# Patient Record
Sex: Female | Born: 1950 | Race: Black or African American | Hispanic: No | Marital: Married | Smoking: Never smoker
Health system: Southern US, Community
[De-identification: ages and names within clinical notes are randomized; demographics above are authoritative.]

## PROBLEM LIST (undated history)

## (undated) ENCOUNTER — Emergency Department (HOSPITAL_BASED_OUTPATIENT_CLINIC_OR_DEPARTMENT_OTHER): Discharge status: Absent without leave | Payer: 59

## (undated) DIAGNOSIS — I10 Essential (primary) hypertension: Secondary | ICD-10-CM

## (undated) DIAGNOSIS — C50919 Malignant neoplasm of unspecified site of unspecified female breast: Secondary | ICD-10-CM

## (undated) DIAGNOSIS — E119 Type 2 diabetes mellitus without complications: Secondary | ICD-10-CM

## (undated) DIAGNOSIS — M1990 Unspecified osteoarthritis, unspecified site: Secondary | ICD-10-CM

## (undated) DIAGNOSIS — I2510 Atherosclerotic heart disease of native coronary artery without angina pectoris: Secondary | ICD-10-CM

## (undated) DIAGNOSIS — I219 Acute myocardial infarction, unspecified: Secondary | ICD-10-CM

## (undated) DIAGNOSIS — S92919A Unspecified fracture of unspecified toe(s), initial encounter for closed fracture: Secondary | ICD-10-CM

## (undated) DIAGNOSIS — H269 Unspecified cataract: Secondary | ICD-10-CM

## (undated) DIAGNOSIS — M797 Fibromyalgia: Secondary | ICD-10-CM

## (undated) HISTORY — DX: Unspecified fracture of unspecified toe(s), initial encounter for closed fracture: S92.919A

## (undated) HISTORY — PX: ABDOMINAL HYSTERECTOMY: SHX81

## (undated) HISTORY — DX: Acute myocardial infarction, unspecified: I21.9

## (undated) HISTORY — DX: Unspecified cataract: H26.9

## (undated) HISTORY — PX: SMALL INTESTINE SURGERY: SHX150

## (undated) HISTORY — PX: BREAST SURGERY: SHX581

## (undated) HISTORY — PX: TONSILLECTOMY: SUR1361

## (undated) HISTORY — DX: Atherosclerotic heart disease of native coronary artery without angina pectoris: I25.10

---

## 1997-08-29 ENCOUNTER — Ambulatory Visit (HOSPITAL_COMMUNITY): Admission: RE | Admit: 1997-08-29 | Discharge: 1997-08-29 | Payer: Self-pay | Admitting: Internal Medicine

## 1998-04-27 ENCOUNTER — Ambulatory Visit (HOSPITAL_COMMUNITY): Admission: RE | Admit: 1998-04-27 | Discharge: 1998-04-27 | Payer: Self-pay | Admitting: Internal Medicine

## 1998-04-27 ENCOUNTER — Other Ambulatory Visit: Admission: RE | Admit: 1998-04-27 | Discharge: 1998-04-27 | Payer: Self-pay

## 2000-02-28 ENCOUNTER — Encounter: Admission: RE | Admit: 2000-02-28 | Discharge: 2000-02-28 | Payer: Self-pay | Admitting: Internal Medicine

## 2000-02-28 ENCOUNTER — Other Ambulatory Visit: Admission: RE | Admit: 2000-02-28 | Discharge: 2000-02-28 | Payer: Self-pay | Admitting: Internal Medicine

## 2000-02-28 ENCOUNTER — Encounter: Payer: Self-pay | Admitting: Internal Medicine

## 2000-03-12 ENCOUNTER — Encounter: Admission: RE | Admit: 2000-03-12 | Discharge: 2000-03-12 | Payer: Self-pay | Admitting: Urology

## 2000-03-12 ENCOUNTER — Encounter: Payer: Self-pay | Admitting: Urology

## 2000-03-17 ENCOUNTER — Encounter: Payer: Self-pay | Admitting: Urology

## 2000-03-17 ENCOUNTER — Encounter: Admission: RE | Admit: 2000-03-17 | Discharge: 2000-03-17 | Payer: Self-pay | Admitting: Urology

## 2000-05-14 ENCOUNTER — Encounter: Payer: Self-pay | Admitting: Internal Medicine

## 2000-05-14 ENCOUNTER — Encounter: Admission: RE | Admit: 2000-05-14 | Discharge: 2000-05-14 | Payer: Self-pay | Admitting: Internal Medicine

## 2000-05-29 ENCOUNTER — Ambulatory Visit (HOSPITAL_COMMUNITY): Admission: RE | Admit: 2000-05-29 | Discharge: 2000-05-29 | Payer: Self-pay | Admitting: Gastroenterology

## 2000-11-23 ENCOUNTER — Inpatient Hospital Stay (HOSPITAL_COMMUNITY): Admission: EM | Admit: 2000-11-23 | Discharge: 2000-11-27 | Payer: Self-pay

## 2000-11-23 ENCOUNTER — Encounter: Payer: Self-pay | Admitting: Internal Medicine

## 2000-11-24 ENCOUNTER — Encounter: Payer: Self-pay | Admitting: Internal Medicine

## 2000-11-26 ENCOUNTER — Encounter: Payer: Self-pay | Admitting: Internal Medicine

## 2000-11-26 ENCOUNTER — Encounter: Payer: Self-pay | Admitting: Gastroenterology

## 2009-03-22 ENCOUNTER — Emergency Department (HOSPITAL_BASED_OUTPATIENT_CLINIC_OR_DEPARTMENT_OTHER): Admission: EM | Admit: 2009-03-22 | Discharge: 2009-03-22 | Payer: Self-pay | Admitting: Emergency Medicine

## 2010-11-15 NOTE — H&P (Signed)
[place]. [facility]  Patient:    Whitney Ward, Whitney Ward                   MRN: [id number] Adm. Date:  [id number] Attending:  Gwenyth Bender                         History and Physical  CHIEF COMPLAINT:  Severe abdominal pain with nausea and vomiting.  HISTORY OF PRESENT ILLNESS:  This is the first recent [facility] admission for this 60 year old married black female who presented to the emergency room complaining of severe upper abdominal pain of 24-hour duration. She states she had been feeling fairly well except for intermittent vague epigastric discomfort over the past two weeks.  She notably had been taking ibuprofen 200 mg, up to three at night, "to help her sleep."  Last night, she noted increasing epigastric sharp pains.  This was followed by episodic bilious vomiting thereafter.  She had not seen any hematemesis.  No associated melena or hematochezia over the past several days.  The pain became quite severe today to the point of her not being able to stand.  She was unable to eat and subsequently came to the emergency room for further evaluation.  She notably, while in the emergency room, had transient left lower quadrant pain as well.  Patient denied constipation.  She denied change in bowel habits with associated diarrhea, though husband relates she has had episodic bouts of this over the past week.  Notably, she states she felt well until she ate a sausage at a cookout.  Several other family members ate similar food without associated similar symptoms.  The patient has not had similar episodes in the past.  She has had a colonoscopy in November per Dr. Katy Fitch. Buccini without abnormalities being found.  She notably had had some intermittent reflux symptoms which are different from her presenting symptoms.  It was advised that she undergo endoscopy in November to rule out a Barretts esophagus, however, patient declined at that  time.  HABITS:  Patient does not smoke or drink.  PAST MEDICAL HISTORY:  Remarkable for allergic rhinitis, mild hypertension; she has also suffered from intermittent hot flashes as well.  ALLERGIES:  PENICILLIN, CODEINE and SULFA.  MEDICATIONS: 1. Prilosec 20 mg p.o. q.h.s. 2. Toprol-XL 25 mg q.d. 3. Allegra 180 mg q.d. 4. Triamterene/hydrochlorothiazide 25 mg p.o. q.d. 5. Premarin 0.625 mg p.o. q.d.  SOCIAL HISTORY:  Patient is married, works as a Engineer, civil (consulting) at [employer].  REVIEW OF SYSTEMS:  Otherwise as noted.  She also notably had transient tingling sensations in both hands as well as the left side of her face during this episode.  It was noted by EMS that she was hyperventilating with respiratory rate up to 40; this has gradually subsided as well.  PHYSICAL EXAMINATION:  GENERAL:  She is a well-developed, well-nourished black female in acute distress.  VITAL SIGNS:  Blood pressure 136/90, pulse of 94, respiratory rate 24, temperature 99.9.  O2 saturation 99% on room air.  HEENT:  Head normocephalic, atraumatic, without bruits.  Extraocular muscles are intact with mild proptosis of the right eye.  Pupils equal and reactive to light and accommodation.  There is no sinus tenderness and no scleral icterus. TMs with diffuse light reflex without erythematous changes.  Posterior pharynx is clear.  NECK:  Supple.  No posterior cervical nodes.  LUNGS:  Clear without wheezes  or rales.  No E-to-A changes.  No CVA tenderness.  CARDIOVASCULAR:  Normal S1 and S2.  No S3, S4, murmurs or rubs.  ABDOMEN:  Bowel sounds notably present.  Marked epigastric to midgastric tenderness.  There is mild tenderness in the left lower quadrant as well.  No masses appreciated.  MUSCULOSKELETAL:  Full passive range of motion in upper extremities.  No edema or tenderness in the lower extremities.  Negative Homans.  NEUROLOGIC:  Alert and oriented x 3.  Cranial nerves were intact.   Cerebellar, sensory and motor function were intact.  LABORATORY DATA:  CBC reveals a WBC 6200, hemoglobin of 12.1, hematocrit 34.8, MCV of 88, RDW 12.5, platelets 249,000.  She had 90 neutrophils, 5% lymphs, 5% monos.  Chemistries:  Sodium 141, potassium 3.2, chloride 110, CO2 24, BUN of 12, creatinine 0.9, glucose 130, mildly elevated, calcium 8.8, total protein 6.8, albumin 3.5, SGOT 21, SGPT 18, alkaline phosphatase 99, bilirubin was 0.7.  Lipase was 19, which was low; amylase 79, within normal limits.  Abdominal film showed no evidence of obstruction.  Mild increased stool in the left lower quadrant.  IMPRESSION: 1. Abdominal pain of questionable etiology.  Rule out severe gastritis versus    esophagitis with mild atypical presentation.  She notably had been using    more nonsteroidal anti-inflammatory drugs at h.s. 2. Rule out colonic spasms.  Patient with severe left lower quadrant pain,    normal colonoscopy as of November. 3. Hypokalemia.  This may be secondary to her diuretic as well as recent bouts    of diarrhea, nausea and vomiting.  This may also have aggravated her    transient dysesthesias. 4. Transient dysesthesias of arms and face, rule out secondary to    hypoventilation versus hypokalemia versus other.  They have presently    resolved. 5. History of allergic rhinitis. 6. Vasomotor symptoms persistent. 7. Hypertension. 8. Mild hyperglycemia.  This would be of relatively new onset.  PLAN: 1. Patient will be placed at rest. 2. IV fluids with potassium replacement. 3. Control of pain acutely at this time with a close clinical followup. 4. GI evaluation of her upper abdominal region.  This has been previously    recommended in the past; patient had declined.  Will continue intravenous    Pepcid with a Carafate slurry for now.  Guaiac of stools x 3.  Follow up    electrolytes after this is corrected.  Further evaluation thereafter.  DD: 11/23/00 TD:  11/24/00 Job:  [id number]

## 2010-11-15 NOTE — Discharge Summary (Signed)
[place]. [facility]  Patient:    Whitney Ward, Whitney Ward                   MRN: [id number] Adm. Date:  [id number] Disc. Date: [id number] Attending:  Gwenyth Bender                           Discharge Summary  FINAL DIAGNOSES:  1. Esophageal reflux, 530.81.  2. Diaphragmatic hernia, 553.3.  3. Hypokalemia, 276.8.  4. Hypertension, 401.9.  5. Abnormal blood chemistries, 790.6.  6. Chronic sinusitis, 473.9.  7. Allergic rhinitis, 477.9.  8. 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  9. 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 10. 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  OPERATIONS/PROCEDURES:  Small-bowel endoscopy per Dr. Carman Ching.  HISTORY OF PRESENT ILLNESS:  This was the first recent [facility] admission for this 60 year old married black female who presented to the emergency room complaining of severe upper abdominal pain of 24-hour duration. She states the pain had been intermittent in nature over the past two weeks. She notably had been taking ibuprofen 200 mg up to three at night.  States this was taken to help her sleep.  One night prior to admission she noted increasing epigastric pain which was sharp in nature.  This was followed by episodic bilious vomiting thereafter.  There had been no associated hematemesis.  No melena or hematochezia.  The pain became severe on the day of admission.  She was unable to stand.  She was unable to eat and subsequently was brought to the emergency room for further evaluation.  PAST MEDICAL HISTORY:  As per admission H&P.  PHYSICAL EXAMINATION:  As per admission H&P.  HOSPITAL COURSE:  The patient was admitted for further evaluation of severe abdominal pain.  Notably, she had no associated fever, maximum being 99.9. White count was normal as well.  Her initial chemistries were normal.  The patient was placed at bowel rest.  She was placed on IV fluids.  Notably, her potassium was low on presentation at 3.2.  This was  replaced via IV route and then p.o. route.  The patient was placed on IV Pepcid in lieu of the epigastric nature of her pain.  She was also started on Carafate slurry. Stools were obtained x 3.  No evidence for occult blood was noted.  The patient was seen in consultation by Dr. Carman Ching.  It was unclear as to the etiology of her pain.  She subsequently underwent further evaluation including abdominal ultrasound, which was negative.  A CT scan of the abdomen was negative as well.  She subsequently underwent endoscopy per Dr. Randa Evens. She was found to have a small hiatal hernia with mild gastritis as well. Notably, a CT scan of the head was also obtained due to headaches and sinus tenderness.  She was found to have right greater than left sphenoid sinusitis.  The patient was continued on Nexium at 40 mg p.o. b.i.d.  She was started on Vantin for her sinuses.  She was maintained on Allegra for her allergies. Over the subsequent day, however, her symptoms did improve.  Stools for occult blood and parasites were obtained, which were negative as well.  By Nov 27, 2000, she was feeling much better.  Abdominal pain had resolved considerably. She was felt to be stable for discharge.  DISCHARGE MEDICATIONS: 1. Nexium 40 mg p.o. q.a.m. 2. Nasonex 1 capsule b.i.d. 3. Claritin 10 mg p.o. q.a.m.  4. K-Dur 20 mEq b.i.d. 5. Premarin 0.625 mg q.d. 6. Vitamin E 400 IU b.i.d. 7. Flagyl 250 mg t.i.d.  DISCHARGE INSTRUCTIONS:  She will be maintained on a soft, calcium-free diet. Activity as tolerated.  FOLLOW-UP:  Follow-up appointment in one weeks time. DD:  01/13/01 TD:  01/15/01 Job: [id number]

## 2010-11-15 NOTE — Procedures (Signed)
[place]. [facility]  Patient:    Whitney Ward, Whitney Ward                   MRN: [id number] Proc. Date: 11/25/00 Adm. Date:  [id number] Attending:  Gwenyth Bender CC:         Lind Guest. August Saucer, M.D.   Procedure Report  PROCEDURE PERFORMED:  Esophagogastroduodenscopy.  ENDOSCOPIST:  Llana Aliment. Edwards, M.D.  MEDICATIONS:  Hurricaine spray, fentanyl 50 mcg, Versed 5 mg IV.  INDICATIONS:  Epigastric pain of unclear.  DESCRIPTION OF PROCEDURE:  The procedure had been explained to the patient and consent obtained.  With the patient in the left lateral decubitus position, the Olympus video endoscope was inserted blindly in the esophagus and advanced under direct visualization.  The stomach was entered.  The pylorus identified and passed.  The duodenum including the bulb and second portion were seen well.  The scope was withdrawn back into the stomach.  The duodenal bulb was normal and no ulceration.  Pyloric channel was normal.  The antrum and body were seen well and were normal.  The fundus and cardia were seen on retroflex view and were normal.  There was a hiatal hernia.  The distal esophagus was reddened and no ulcerations.  This was consistent with mild esophageal reflux. The proximal esophagus was normal.  The scope was withdrawn.  The patient tolerated the procedure quite well.  ASSESSMENT:  Hiatal hernia probably with some element of esophageal reflux. This is not severe.  I do not see any ulcer or esophagitis.  PLAN:  I would recommend a CT, hepatobiliary scan etc.  Will discuss with Dr. August Saucer.  PLAN: DD:  11/25/00 TD:  11/25/00 Job: [id number]

## 2010-11-15 NOTE — Consult Note (Signed)
[place]. [facility]  Patient:    Whitney Ward, Whitney Ward                   MRN: [id number] Proc. Date: 11/23/00 Adm. Date:  [id number] Attending:  Gwenyth Bender CC:         Lind Guest. August Saucer, M.D.  Florencia Reasons, M.D.   Consultation Report  REASON FOR CONSULTATION:  Nausea, vomiting, abdominal pain.  HISTORY OF PRESENT ILLNESS:  A 60 year old woman who has had previous esophageal reflux.  She has been on Prilosec daily for this.  She clearly recognizes reflux and notes that she has had this for some time.  She had been well, in her usual state of health, until Monday morning, when she awakened with severe epigastric abdominal pain.  This was yesterday morning when it awakened her.  This had been intermittent for two weeks and she had been taking ibuprofen, two or three at night, to help her sleep.  She had bilious vomiting following awakening up with this yesterday and in addition had some loose stools.  She is not chronically constipated.  Again, she had been taking the ibuprofen.  CURRENT MEDICATIONS: 1. Prilosec. 2. Toprol. 3. ______. 4. Premarin.  ALLERGIES: 1. PENICILLIN. 2. CODEINE. 3. SULFA.  PAST MEDICAL HISTORY:  Chronic esophageal reflux, hypertension.  She also has seasonal inhalation allergies.  PAST SURGICAL HISTORY:  Hysterectomy and C-section.  FAMILY HISTORY:  A maternal aunt and uncles have had colon cancer.  There is no family history of liver disease.  SOCIAL HISTORY:  She is a Therapist, music at [employer].  She is married and lives in [place].  She drinks approximately once a week.  REVIEW OF SYSTEMS:  The patient had a screening colonoscopy due to her family history of colon cancer back in November, which was reported to be normal, by Dr. Matthias Hughs.  PHYSICAL EXAMINATION:  VITAL SIGNS:  The patient is afebrile.  Vital signs are normal.  GENERAL:  A pleasant black female in no acute distress.  HEENT:  Eyes:  Sclerae  non-icteric.  Extraocular movements intact.  NECK:  Supple.  No lymphadenopathy.  LUNGS:  Clear.  HEART:  Regular rate and rhythm without murmurs or gallops.  ABDOMEN:  Good bowel sounds.  Mild epigastric tenderness to palpation. Otherwise, unremarkable.  LABORATORY DATA:  White count 6.2, hemoglobin 12.1.  Electrolytes are not remarkable.  There was a slightly low potassium of 3.2, which is being corrected.  Liver tests, amylase, and lipase were all normal.  Gallbladder ultrasound has been obtained and is normal.  No evidence of gallstones.  ASSESSMENT:  Nausea, vomiting, and epigastric pain.  This could be due to a multitude of things including a viral gastroenteritis type picture.  The patient has been taking a large amount of ibuprofen recently.  I think, in view of this, we probably ought to go ahead with an endoscopy while she is here.  I have discussed this with her and she is agreeable.  PLAN:  Endoscopy is scheduled for tomorrow at 1:30.  We have discussed this with the patient including potential risks and benefits and we will plan on moving ahead. DD:  11/24/00 TD:  11/24/00 Job: [id number]

## 2011-12-21 ENCOUNTER — Emergency Department (HOSPITAL_BASED_OUTPATIENT_CLINIC_OR_DEPARTMENT_OTHER): Payer: BC Managed Care – PPO

## 2011-12-21 ENCOUNTER — Encounter (HOSPITAL_BASED_OUTPATIENT_CLINIC_OR_DEPARTMENT_OTHER): Payer: Self-pay | Admitting: Emergency Medicine

## 2011-12-21 ENCOUNTER — Emergency Department (HOSPITAL_BASED_OUTPATIENT_CLINIC_OR_DEPARTMENT_OTHER)
Admission: EM | Admit: 2011-12-21 | Discharge: 2011-12-21 | Disposition: A | Discharge status: Home / Self Care | Payer: BC Managed Care – PPO | Attending: Emergency Medicine | Admitting: Emergency Medicine

## 2011-12-21 DIAGNOSIS — Z7982 Long term (current) use of aspirin: Secondary | ICD-10-CM | POA: Insufficient documentation

## 2011-12-21 DIAGNOSIS — Z853 Personal history of malignant neoplasm of breast: Secondary | ICD-10-CM | POA: Insufficient documentation

## 2011-12-21 DIAGNOSIS — Z8739 Personal history of other diseases of the musculoskeletal system and connective tissue: Secondary | ICD-10-CM | POA: Insufficient documentation

## 2011-12-21 DIAGNOSIS — Z79899 Other long term (current) drug therapy: Secondary | ICD-10-CM | POA: Insufficient documentation

## 2011-12-21 DIAGNOSIS — IMO0001 Reserved for inherently not codable concepts without codable children: Secondary | ICD-10-CM | POA: Insufficient documentation

## 2011-12-21 DIAGNOSIS — N23 Unspecified renal colic: Secondary | ICD-10-CM | POA: Insufficient documentation

## 2011-12-21 DIAGNOSIS — R109 Unspecified abdominal pain: Secondary | ICD-10-CM | POA: Insufficient documentation

## 2011-12-21 DIAGNOSIS — I10 Essential (primary) hypertension: Secondary | ICD-10-CM | POA: Insufficient documentation

## 2011-12-21 HISTORY — DX: Essential (primary) hypertension: I10

## 2011-12-21 HISTORY — DX: Unspecified osteoarthritis, unspecified site: M19.90

## 2011-12-21 HISTORY — DX: Malignant neoplasm of unspecified site of unspecified female breast: C50.919

## 2011-12-21 HISTORY — DX: Fibromyalgia: M79.7

## 2011-12-21 LAB — COMPREHENSIVE METABOLIC PANEL
ALT: 18 U/L (ref 0–35)
AST: 22 U/L (ref 0–37)
Albumin: 4.2 g/dL (ref 3.5–5.2)
Calcium: 9.7 mg/dL (ref 8.4–10.5)
Creatinine, Ser: 0.9 mg/dL (ref 0.50–1.10)
Sodium: 141 mEq/L (ref 135–145)

## 2011-12-21 LAB — DIFFERENTIAL
Basophils Absolute: 0 10*3/uL (ref 0.0–0.1)
Basophils Relative: 0 % (ref 0–1)
Eosinophils Relative: 5 % (ref 0–5)
Monocytes Absolute: 0.4 10*3/uL (ref 0.1–1.0)
Neutro Abs: 1.8 10*3/uL (ref 1.7–7.7)

## 2011-12-21 LAB — CBC
HCT: 35.7 % — ABNORMAL LOW (ref 36.0–46.0)
MCHC: 34.2 g/dL (ref 30.0–36.0)
Platelets: 226 10*3/uL (ref 150–400)
RDW: 11.5 % (ref 11.5–15.5)
WBC: 3.3 10*3/uL — ABNORMAL LOW (ref 4.0–10.5)

## 2011-12-21 LAB — URINALYSIS, ROUTINE W REFLEX MICROSCOPIC
Glucose, UA: NEGATIVE mg/dL
Ketones, ur: NEGATIVE mg/dL
Leukocytes, UA: NEGATIVE
Protein, ur: NEGATIVE mg/dL
Urobilinogen, UA: 0.2 mg/dL (ref 0.0–1.0)

## 2011-12-21 LAB — WET PREP, GENITAL: Trich, Wet Prep: NONE SEEN

## 2011-12-21 MED ORDER — SODIUM CHLORIDE 0.9 % IV BOLUS (SEPSIS)
1000.0000 mL | Freq: Once | INTRAVENOUS | Status: AC
  Administered 2011-12-21: 1000 mL via INTRAVENOUS

## 2011-12-21 NOTE — ED Notes (Signed)
C/o of lower back pain x 4 days/ with abdominal pain  denies urinary freq/c/o of urinary burning sometimes

## 2011-12-21 NOTE — ED Notes (Signed)
Patient transported to CT 

## 2011-12-21 NOTE — ED Provider Notes (Signed)
History     CSN: [id number]  Arrival date & time 12/21/11  1008   First MD Initiated Contact with Patient 12/21/11 1031      Chief Complaint  Patient presents with  . Back Pain  . Abdominal Pain    (Consider location/radiation/quality/duration/timing/severity/associated sxs/prior treatment) HPI Pt reprots she has had several days of moderate aching low back pain and lower abdominal pain, worse with movement and bending. No fever, vomiting, diarrhea, vaginal discharge or bleeding. She reports some occasional mild burning with urination, but no frequency or hesitancy. Recently completed a course of chemo and radiation for breast cancer following lumpectomy.   Past Medical History  Diagnosis Date  . Breast cancer   . Hypertension   . Fibromyalgia   . Arthritis     Past Surgical History  Procedure Date  . Breast surgery   . Abdominal hysterectomy     No family history on file.  History  Substance Use Topics  . Smoking status: Never Smoker   . Smokeless tobacco: Not on file  . Alcohol Use: Yes    OB History    Grav Para Term Preterm Abortions TAB SAB Ect Mult Living                  Review of Systems Unable to assess due to mental status.   Allergies  Codeine; Penicillins; Sulfa antibiotics; and Tramadol  Home Medications   Current Outpatient Rx  Name Route Sig Dispense Refill  . ANASTROZOLE 1 MG PO TABS Oral Take 1 mg by mouth daily.    . ASPIRIN 81 MG PO TABS Oral Take 81 mg by mouth daily.    . CELECOXIB 200 MG PO CAPS Oral Take 200 mg by mouth 2 (two) times daily.    Marland Kitchen CLONAZEPAM 0.5 MG PO TABS Oral Take 0.5 mg by mouth 2 (two) times daily as needed.    Marland Kitchen DILTIAZEM HCL 120 MG PO TABS Oral Take 120 mg by mouth 4 (four) times daily.    Marland Kitchen HYDROCHLOROTHIAZIDE 25 MG PO TABS Oral Take 25 mg by mouth daily.    Marland Kitchen HYDROCODONE-ACETAMINOPHEN 5-500 MG PO TABS Oral Take 1 tablet by mouth every 6 (six) hours as needed.    Marland Kitchen LABETALOL HCL 100 MG PO TABS Oral Take 100  mg by mouth 2 (two) times daily.    Marland Kitchen LORAZEPAM 0.5 MG PO TABS Oral Take 0.5 mg by mouth every 8 (eight) hours.    Marland Kitchen ZOLPIDEM TARTRATE 10 MG PO TABS Oral Take 10 mg by mouth at bedtime as needed.      BP 154/89  Pulse 81  Temp 98.2 F (36.8 C)  Resp 20  SpO2 100%  Physical Exam  Nursing note and vitals reviewed. Constitutional: She is oriented to person, place, and time. She appears well-developed and well-nourished.  HENT:  Head: Normocephalic and atraumatic.  Eyes: EOM are normal. Pupils are equal, round, and reactive to light.  Neck: Normal range of motion. Neck supple.  Cardiovascular: Normal rate, normal heart sounds and intact distal pulses.   Pulmonary/Chest: Effort normal and breath sounds normal.  Abdominal: Bowel sounds are normal. She exhibits no distension. There is tenderness (mild tenderness in lower abdomen bilaterally). There is no rebound and no guarding.  Genitourinary: Right adnexum displays tenderness. Right adnexum displays no mass. Left adnexum displays no mass and no tenderness. No bleeding around the vagina. No foreign body around the vagina. No vaginal discharge found.  R CVA tenderness; cervix is surgically absent  Musculoskeletal: Normal range of motion. She exhibits no edema and no tenderness.  Neurological: She is alert and oriented to person, place, and time. She has normal strength. No cranial nerve deficit or sensory deficit.  Skin: Skin is warm and dry. No rash noted.  Psychiatric: She has a normal mood and affect.    ED Course  Procedures (including critical care time)  Labs Reviewed  CBC - Abnormal; Notable for the following:    WBC 3.3 (*)     HCT 35.7 (*)     All other components within normal limits  COMPREHENSIVE METABOLIC PANEL - Abnormal; Notable for the following:    GFR calc non Af Amer 68 (*)     GFR calc Af Amer 78 (*)     All other components within normal limits  URINALYSIS, ROUTINE W REFLEX MICROSCOPIC  DIFFERENTIAL  WET  PREP, GENITAL  GC/CHLAMYDIA PROBE AMP, GENITAL   Ct Abdomen Pelvis Wo Contrast  12/21/2011  *RADIOLOGY REPORT*  Clinical Data: 61 year old female with abdominal and pelvic pain. History of breast cancer.  CT ABDOMEN AND PELVIS WITHOUT CONTRAST  Technique:  Multidetector CT imaging of the abdomen and pelvis was performed following the standard protocol without intravenous contrast.  Comparison: None  Findings: The liver, spleen, pancreas, adrenal gland and gallbladder are unremarkable. There is mild fullness of the right intrarenal collecting system and ureter without obstructing cause identified. A punctate nonobstructing mid left renal calculus is present.  Please note that parenchymal abnormalities may be missed as intravenous contrast was not administered. No free fluid, enlarged lymph nodes, biliary dilation or abdominal aortic aneurysm identified.  The bowel, appendix and bladder are unremarkable. The patient is status post hysterectomy. No acute or suspicious bony abnormalities are present. Moderate degenerative changes in the lower lumbar spine are present.  IMPRESSION: Fullness of the right intrarenal collecting system and ureter - may be physiologically normal, related to infection, or be secondary to a passed calculus.  Punctate nonobstructing left renal calculus.  Original Report Authenticated By: Rosendo Gros, M.D.     No diagnosis found.    MDM  Labs and imaging as above. Pt does report pain originated in back and moved to the front, was colicky in nature and has essentially resolved at this point. Likely a missed renal stone. Will d/c with PCP followup as needed.        Dnyla Antonetti B. Bernette Mayers, MD 12/21/11 1259

## 2011-12-21 NOTE — Discharge Instructions (Signed)
Ureteral Colic (Kidney Stones) Ureteral colic is the result of a condition when kidney stones form inside the kidney. Once kidney stones are formed they may move into the tube that connects the kidney with the bladder (ureter). If this occurs, this condition may cause pain (colic) in the ureter.  CAUSES  Pain is caused by stone movement in the ureter and the obstruction caused by the stone. SYMPTOMS  The pain comes and goes as the ureter contracts around the stone. The pain is usually intense, sharp, and stabbing in character. The location of the pain may move as the stone moves through the ureter. When the stone is near the kidney the pain is usually located in the back and radiates to the belly (abdomen). When the stone is ready to pass into the bladder the pain is often located in the lower abdomen on the side the stone is located. At this location, the symptoms may mimic those of a urinary tract infection with urinary frequency. Once the stone is located here it often passes into the bladder and the pain disappears completely. TREATMENT   Your caregiver will provide you with medicine for pain relief.   You may require specialized follow-up X-rays.   The absence of pain does not always mean that the stone has passed. It may have just stopped moving. If the urine remains completely obstructed, it can cause loss of kidney function or even complete destruction of the involved kidney. It is your responsibility and in your interest that X-rays and follow-ups as suggested by your caregiver are completed. Relief of pain without passage of the stone can be associated with severe damage to the kidney, including loss of kidney function on that side.   If your stone does not pass on its own, additional measures may be taken by your caregiver to ensure its removal.  HOME CARE INSTRUCTIONS   Increase your fluid intake. Water is the preferred fluid since juices containing vitamin C may acidify the urine making  it less likely for certain stones (uric acid stones) to pass.   Strain all urine. A strainer will be provided. Keep all particulate matter or stones for your caregiver to inspect.   Take your pain medicine as directed.   Make a follow-up appointment with your caregiver as directed.   Remember that the goal is passage of your stone. The absence of pain does not mean the stone is gone. Follow your caregiver's instructions.   Only take over-the-counter or prescription medicines for pain, discomfort, or fever as directed by your caregiver.  SEEK MEDICAL CARE IF:   Pain cannot be controlled with the prescribed medicine.   You have a fever.   Pain continues for longer than your caregiver advises it should.   There is a change in the pain, and you develop chest discomfort or constant abdominal pain.   You feel faint or pass out.  MAKE SURE YOU:   Understand these instructions.   Will watch your condition.   Will get help right away if you are not doing well or get worse.  Document Released: 03/26/2005 Document Revised: 06/05/2011 Document Reviewed: 12/11/2010 [facility] Patient Information 2012 [place], [place].  Kidney Stones Kidney stones (ureteral lithiasis) are deposits that form inside your kidneys. The intense pain is caused by the stone moving through the urinary tract. When the stone moves, the ureter goes into spasm around the stone. The stone is usually passed in the urine.  CAUSES   A disorder that makes certain  neck glands produce too much parathyroid hormone (primary hyperparathyroidism).   A buildup of uric acid crystals.   Narrowing (stricture) of the ureter.   A kidney obstruction present at birth (congenital obstruction).   Previous surgery on the kidney or ureters.   Numerous kidney infections.  SYMPTOMS   Feeling sick to your stomach (nauseous).   Throwing up (vomiting).   Blood in the urine (hematuria).   Pain that usually spreads (radiates) to the  groin.   Frequency or urgency of urination.  DIAGNOSIS   Taking a history and physical exam.   Blood or urine tests.   Computerized X-ray scan (CT scan).   Occasionally, an examination of the inside of the urinary bladder (cystoscopy) is performed.  TREATMENT   Observation.   Increasing your fluid intake.   Surgery may be needed if you have severe pain or persistent obstruction.  The size, location, and chemical composition are all important variables that will determine the proper choice of action for you. Talk to your caregiver to better understand your situation so that you will minimize the risk of injury to yourself and your kidney.  HOME CARE INSTRUCTIONS   Drink enough water and fluids to keep your urine clear or pale yellow.   Strain all urine through the provided strainer. Keep all particulate matter and stones for your caregiver to see. The stone causing the pain may be as small as a grain of salt. It is very important to use the strainer each and every time you pass your urine. The collection of your stone will allow your caregiver to analyze it and verify that a stone has actually passed.   Only take over-the-counter or prescription medicines for pain, discomfort, or fever as directed by your caregiver.   Make a follow-up appointment with your caregiver as directed.   Get follow-up X-rays if required. The absence of pain does not always mean that the stone has passed. It may have only stopped moving. If the urine remains completely obstructed, it can cause loss of kidney function or even complete destruction of the kidney. It is your responsibility to make sure X-rays and follow-ups are completed. Ultrasounds of the kidney can show blockages and the status of the kidney. Ultrasounds are not associated with any radiation and can be performed easily in a matter of minutes.  SEEK IMMEDIATE MEDICAL CARE IF:   Pain cannot be controlled with the prescribed medicine.   You  have a fever.   The severity or intensity of pain increases over 18 hours and is not relieved by pain medicine.   You develop a new onset of abdominal pain.   You feel faint or pass out.  MAKE SURE YOU:   Understand these instructions.   Will watch your condition.   Will get help right away if you are not doing well or get worse.  Document Released: 06/16/2005 Document Revised: 06/05/2011 Document Reviewed: 10/12/2009 [facility] Patient Information 2012 [place], [place].

## 2011-12-22 LAB — GC/CHLAMYDIA PROBE AMP, GENITAL
Chlamydia, DNA Probe: NEGATIVE
GC Probe Amp, Genital: NEGATIVE

## 2011-12-23 ENCOUNTER — Emergency Department (HOSPITAL_BASED_OUTPATIENT_CLINIC_OR_DEPARTMENT_OTHER): Payer: BC Managed Care – PPO

## 2011-12-23 ENCOUNTER — Encounter (HOSPITAL_BASED_OUTPATIENT_CLINIC_OR_DEPARTMENT_OTHER): Payer: Self-pay | Admitting: Emergency Medicine

## 2011-12-23 ENCOUNTER — Emergency Department (HOSPITAL_BASED_OUTPATIENT_CLINIC_OR_DEPARTMENT_OTHER)
Admission: EM | Admit: 2011-12-23 | Discharge: 2011-12-23 | Disposition: A | Discharge status: Home / Self Care | Payer: BC Managed Care – PPO | Attending: Emergency Medicine | Admitting: Emergency Medicine

## 2011-12-23 DIAGNOSIS — M25551 Pain in right hip: Secondary | ICD-10-CM

## 2011-12-23 DIAGNOSIS — M25559 Pain in unspecified hip: Secondary | ICD-10-CM | POA: Insufficient documentation

## 2011-12-23 DIAGNOSIS — R109 Unspecified abdominal pain: Secondary | ICD-10-CM | POA: Insufficient documentation

## 2011-12-23 DIAGNOSIS — IMO0001 Reserved for inherently not codable concepts without codable children: Secondary | ICD-10-CM | POA: Insufficient documentation

## 2011-12-23 DIAGNOSIS — R1110 Vomiting, unspecified: Secondary | ICD-10-CM | POA: Insufficient documentation

## 2011-12-23 DIAGNOSIS — Z923 Personal history of irradiation: Secondary | ICD-10-CM | POA: Insufficient documentation

## 2011-12-23 DIAGNOSIS — Z9221 Personal history of antineoplastic chemotherapy: Secondary | ICD-10-CM | POA: Insufficient documentation

## 2011-12-23 DIAGNOSIS — C50919 Malignant neoplasm of unspecified site of unspecified female breast: Secondary | ICD-10-CM | POA: Insufficient documentation

## 2011-12-23 DIAGNOSIS — I10 Essential (primary) hypertension: Secondary | ICD-10-CM | POA: Insufficient documentation

## 2011-12-23 LAB — URINALYSIS, ROUTINE W REFLEX MICROSCOPIC
Bilirubin Urine: NEGATIVE
Ketones, ur: NEGATIVE mg/dL
Nitrite: NEGATIVE
Protein, ur: NEGATIVE mg/dL
Urobilinogen, UA: 0.2 mg/dL (ref 0.0–1.0)

## 2011-12-23 MED ORDER — OXYCODONE-ACETAMINOPHEN 5-325 MG PO TABS: 1.0000 | ORAL_TABLET | ORAL | Status: AC | PRN

## 2011-12-23 MED ORDER — SODIUM CHLORIDE 0.9 % IV SOLN
INTRAVENOUS | Status: DC
  Administered 2011-12-23: 11:00:00 via INTRAVENOUS

## 2011-12-23 MED ORDER — ONDANSETRON HCL 4 MG/2ML IJ SOLN
4.0000 mg | Freq: Once | INTRAMUSCULAR | Status: AC
  Administered 2011-12-23: 4 mg via INTRAVENOUS
  Filled 2011-12-23: qty 2

## 2011-12-23 MED ORDER — HYDROMORPHONE HCL PF 2 MG/ML IJ SOLN
2.0000 mg | Freq: Once | INTRAMUSCULAR | Status: AC
  Administered 2011-12-23: 2 mg via INTRAVENOUS
  Filled 2011-12-23: qty 1

## 2011-12-23 NOTE — ED Notes (Signed)
C/o sharp right flank pain.  States same place as previous stone but pain is sharper.

## 2011-12-23 NOTE — ED Provider Notes (Signed)
History     CSN: [id number]  Arrival date & time 12/23/11  0915   First MD Initiated Contact with Patient 12/23/11 1003      Chief Complaint  Patient presents with  . Flank Pain    (Consider location/radiation/quality/duration/timing/severity/associated sxs/prior treatment) HPI Comments: The patient is a 61 year old woman who has a pain in the right flank going into the right groin. The pain is quite severe, and causes nausea. She had previously been seen here 2 days ago, and CT of her abdomen and pelvis without contrast tested possible passed stone. She was not prescribed any medication at that time. She has a prior history of breast cancer, and completed radiation and chemotherapy in April of 2013. There is also some pain in the right hip when she walks, and she has had previous injections of steroids into her hips for this.  Patient is a 61 y.o. female presenting with flank pain. The history is provided by the patient and medical records. No language interpreter was used.  Flank Pain This is a recurrent problem. The problem occurs constantly. The problem has not changed since onset.Nothing aggravates the symptoms. Nothing relieves the symptoms. She has tried nothing for the symptoms.    Past Medical History  Diagnosis Date  . Breast cancer   . Hypertension   . Fibromyalgia   . Arthritis     Past Surgical History  Procedure Date  . Breast surgery   . Abdominal hysterectomy     No family history on file.  History  Substance Use Topics  . Smoking status: Never Smoker   . Smokeless tobacco: Not on file  . Alcohol Use: Yes    OB History    Grav Para Term Preterm Abortions TAB SAB Ect Mult Living                  Review of Systems  Constitutional: Negative.  Negative for fever and chills.  Eyes: Negative.   Respiratory: Negative.   Cardiovascular: Negative.   Gastrointestinal: Positive for vomiting.  Genitourinary: Positive for flank pain. Negative for dysuria  and hematuria.  Skin: Negative.   Neurological: Negative.   Psychiatric/Behavioral: Negative.     Allergies  Codeine; Penicillins; Sulfa antibiotics; and Tramadol  Home Medications   Current Outpatient Rx  Name Route Sig Dispense Refill  . ANASTROZOLE 1 MG PO TABS Oral Take 1 mg by mouth daily.    . ASPIRIN 81 MG PO TABS Oral Take 81 mg by mouth daily.    . CELECOXIB 200 MG PO CAPS Oral Take 200 mg by mouth 2 (two) times daily.    Marland Kitchen CLONAZEPAM 0.5 MG PO TABS Oral Take 0.5 mg by mouth 2 (two) times daily as needed.    Marland Kitchen DILTIAZEM HCL 120 MG PO TABS Oral Take 120 mg by mouth 4 (four) times daily.    Marland Kitchen HYDROCHLOROTHIAZIDE 25 MG PO TABS Oral Take 25 mg by mouth daily.    Marland Kitchen HYDROCODONE-ACETAMINOPHEN 5-500 MG PO TABS Oral Take 1 tablet by mouth every 6 (six) hours as needed.    Marland Kitchen LABETALOL HCL 100 MG PO TABS Oral Take 100 mg by mouth 2 (two) times daily.    Marland Kitchen LORAZEPAM 0.5 MG PO TABS Oral Take 0.5 mg by mouth every 8 (eight) hours.    Marland Kitchen ZOLPIDEM TARTRATE 10 MG PO TABS Oral Take 10 mg by mouth at bedtime as needed.      BP 109/79  Pulse 77  Temp 97.3 F (36.3  C) (Oral)  Resp 22  SpO2 100%  Physical Exam  Nursing note and vitals reviewed. Constitutional: She is oriented to person, place, and time.       She is in acute distress with right flank pain that radiates into the right groin.  HENT:  Head: Normocephalic and atraumatic.  Right Ear: External ear normal.  Left Ear: External ear normal.  Mouth/Throat: Oropharynx is clear and moist.  Eyes: Conjunctivae and EOM are normal. Pupils are equal, round, and reactive to light. No scleral icterus.  Neck: Normal range of motion. Neck supple.  Cardiovascular: Normal rate, regular rhythm and normal heart sounds.   Pulmonary/Chest: Effort normal and breath sounds normal.  Abdominal: Soft. Bowel sounds are normal.       She localizes pain to the right flank with radiation into the right groin. There is no palpable deformity, mass or  tenderness.  Musculoskeletal: Normal range of motion. She exhibits no edema and no tenderness.  Lymphadenopathy:    She has no cervical adenopathy.  Neurological: She is alert and oriented to person, place, and time.       No sensory or motor deficit.  Skin: Skin is warm and dry.  Psychiatric: She has a normal mood and affect. Her behavior is normal.    ED Course  Procedures (including critical care time)   Labs Reviewed  URINALYSIS, ROUTINE W REFLEX MICROSCOPIC   Ct Abdomen Pelvis Wo Contrast  12/21/2011  *RADIOLOGY REPORT*  Clinical Data: 61 year old female with abdominal and pelvic pain. History of breast cancer.  CT ABDOMEN AND PELVIS WITHOUT CONTRAST  Technique:  Multidetector CT imaging of the abdomen and pelvis was performed following the standard protocol without intravenous contrast.  Comparison: None  Findings: The liver, spleen, pancreas, adrenal gland and gallbladder are unremarkable. There is mild fullness of the right intrarenal collecting system and ureter without obstructing cause identified. A punctate nonobstructing mid left renal calculus is present.  Please note that parenchymal abnormalities may be missed as intravenous contrast was not administered. No free fluid, enlarged lymph nodes, biliary dilation or abdominal aortic aneurysm identified.  The bowel, appendix and bladder are unremarkable. The patient is status post hysterectomy. No acute or suspicious bony abnormalities are present. Moderate degenerative changes in the lower lumbar spine are present.  IMPRESSION: Fullness of the right intrarenal collecting system and ureter - may be physiologically normal, related to infection, or be secondary to a passed calculus.  Punctate nonobstructing left renal calculus.  Original Report Authenticated By: Rosendo Gros, M.D.   10:32 AM Patient was seen and had physical examination. IV fluids, IV medications for pain and nausea were ordered. Urinalysis and CT of the abdomen without  contrast was ordered.  12:17 PM UA and CT of the abdomen and pelvis were negative.  This is probably a musculoskeletal pain. She has an appointment to see her rheumatologist, Kathryne Hitch, M.D., this afternoon.  Rx Percocet for pain; advised to keep her appointment with Dr. Corliss Skains.  1. Right hip pain          Carleene Cooper III, MD 12/23/11 1224

## 2011-12-23 NOTE — Discharge Instructions (Signed)
Whitney Ward, you had physical examination, urinalysis, and CT x-rays of the abdomen and pelvis to check on you for a severe pain in the right flank and groin, with pain when you move around.  The urinalysis was normal, and the  CT showed no intra-abdominal illness.  You have an appointment with your rheumatologist, [name], M.D., this afternoon, that you should keep.  You can take Percocet every 4 hours if needed for pain.

## 2012-05-07 ENCOUNTER — Encounter (HOSPITAL_BASED_OUTPATIENT_CLINIC_OR_DEPARTMENT_OTHER): Payer: Self-pay

## 2012-05-07 ENCOUNTER — Emergency Department (HOSPITAL_BASED_OUTPATIENT_CLINIC_OR_DEPARTMENT_OTHER)
Admission: EM | Admit: 2012-05-07 | Discharge: 2012-05-07 | Disposition: A | Discharge status: Home / Self Care | Payer: BC Managed Care – PPO | Attending: Emergency Medicine | Admitting: Emergency Medicine

## 2012-05-07 DIAGNOSIS — Z79899 Other long term (current) drug therapy: Secondary | ICD-10-CM | POA: Insufficient documentation

## 2012-05-07 DIAGNOSIS — Z7982 Long term (current) use of aspirin: Secondary | ICD-10-CM | POA: Insufficient documentation

## 2012-05-07 DIAGNOSIS — I10 Essential (primary) hypertension: Secondary | ICD-10-CM | POA: Insufficient documentation

## 2012-05-07 DIAGNOSIS — C50919 Malignant neoplasm of unspecified site of unspecified female breast: Secondary | ICD-10-CM | POA: Insufficient documentation

## 2012-05-07 DIAGNOSIS — R112 Nausea with vomiting, unspecified: Secondary | ICD-10-CM | POA: Insufficient documentation

## 2012-05-07 DIAGNOSIS — IMO0001 Reserved for inherently not codable concepts without codable children: Secondary | ICD-10-CM | POA: Insufficient documentation

## 2012-05-07 DIAGNOSIS — M129 Arthropathy, unspecified: Secondary | ICD-10-CM | POA: Insufficient documentation

## 2012-05-07 LAB — URINALYSIS, ROUTINE W REFLEX MICROSCOPIC
Glucose, UA: NEGATIVE mg/dL
Hgb urine dipstick: NEGATIVE
Leukocytes, UA: NEGATIVE
Protein, ur: NEGATIVE mg/dL
pH: 8 (ref 5.0–8.0)

## 2012-05-07 LAB — COMPREHENSIVE METABOLIC PANEL
Alkaline Phosphatase: 88 U/L (ref 39–117)
BUN: 12 mg/dL (ref 6–23)
CO2: 26 mEq/L (ref 19–32)
Chloride: 103 mEq/L (ref 96–112)
Creatinine, Ser: 0.9 mg/dL (ref 0.50–1.10)
GFR calc Af Amer: 78 mL/min — ABNORMAL LOW (ref >90)
GFR calc non Af Amer: 67 mL/min — ABNORMAL LOW (ref >90)
Glucose, Bld: 137 mg/dL — ABNORMAL HIGH (ref 70–99)
Potassium: 3.6 mEq/L (ref 3.5–5.1)
Total Bilirubin: 0.4 mg/dL (ref 0.3–1.2)

## 2012-05-07 LAB — CBC
HCT: 38.3 % (ref 36.0–46.0)
Hemoglobin: 13.2 g/dL (ref 12.0–15.0)
MCV: 89.3 fL (ref 78.0–100.0)
WBC: 6.8 10*3/uL (ref 4.0–10.5)

## 2012-05-07 LAB — LIPASE, BLOOD: Lipase: 14 U/L (ref 11–59)

## 2012-05-07 MED ORDER — SODIUM CHLORIDE 0.9 % IV BOLUS (SEPSIS)
1000.0000 mL | Freq: Once | INTRAVENOUS | Status: AC
  Administered 2012-05-07: 1000 mL via INTRAVENOUS

## 2012-05-07 MED ORDER — SODIUM CHLORIDE 0.9 % IV SOLN
Freq: Once | INTRAVENOUS | Status: AC
  Administered 2012-05-07: 11:00:00 via INTRAVENOUS

## 2012-05-07 MED ORDER — ONDANSETRON HCL 4 MG PO TABS: 4.0000 mg | ORAL_TABLET | Freq: Four times a day (QID) | ORAL | Status: DC

## 2012-05-07 MED ORDER — ONDANSETRON HCL 4 MG/2ML IJ SOLN
4.0000 mg | Freq: Once | INTRAMUSCULAR | Status: AC
  Administered 2012-05-07: 4 mg via INTRAVENOUS
  Filled 2012-05-07: qty 2

## 2012-05-07 MED ORDER — SODIUM CHLORIDE 0.9 % IV BOLUS (SEPSIS): 1000.0000 mL | Freq: Once | INTRAVENOUS | Status: DC

## 2012-05-07 MED ORDER — HYDROMORPHONE HCL PF 1 MG/ML IJ SOLN
1.0000 mg | Freq: Once | INTRAMUSCULAR | Status: AC
  Administered 2012-05-07: 1 mg via INTRAVENOUS
  Filled 2012-05-07: qty 1

## 2012-05-07 MED ORDER — LORAZEPAM 2 MG/ML IJ SOLN
1.0000 mg | Freq: Once | INTRAMUSCULAR | Status: AC
  Administered 2012-05-07: 1 mg via INTRAVENOUS
  Filled 2012-05-07: qty 1

## 2012-05-07 MED ORDER — PANTOPRAZOLE SODIUM 40 MG IV SOLR
40.0000 mg | Freq: Once | INTRAVENOUS | Status: AC
  Administered 2012-05-07: 40 mg via INTRAVENOUS
  Filled 2012-05-07: qty 40

## 2012-05-07 NOTE — ED Provider Notes (Signed)
History     CSN: [id number]  Arrival date & time 05/07/12  4098   First MD Initiated Contact with Patient 05/07/12 1001      Chief Complaint  Patient presents with  . Abdominal Pain  . Nausea  . Emesis    (Consider location/radiation/quality/duration/timing/severity/associated sxs/prior treatment) HPI Pt presents with acute onset last night of nausea/vomiting and diarrhea.  She has had multiple episodes of emesis- nonbloody and nonbilious.  Also loose stools without blood or mucous.  Lower abdominal cramping which is relieved after diarrhea, also increase in her acid reflux symptoms due to vomiting.  Has not been able to keep down liquids.  No fever/chills.  No chest pain.  No known specific sick contacts.  No recent travel.  There are no other associated systemic symptoms, there are no other alleviating or modifying factors.   Past Medical History  Diagnosis Date  . Breast cancer   . Hypertension   . Fibromyalgia   . Arthritis     Past Surgical History  Procedure Date  . Breast surgery   . Abdominal hysterectomy   . Cesarean section     No family history on file.  History  Substance Use Topics  . Smoking status: Never Smoker   . Smokeless tobacco: Never Used  . Alcohol Use: Yes     Comment: occasional    OB History    Grav Para Term Preterm Abortions TAB SAB Ect Mult Living                  Review of Systems ROS reviewed and all otherwise negative except for mentioned in HPI  Allergies  Codeine; Penicillins; Sulfa antibiotics; Tramadol; Aromasin; and Celexa  Home Medications   Current Outpatient Rx  Name  Route  Sig  Dispense  Refill  . BIOTIN 1 MG PO CAPS   Oral   Take by mouth.         Marland Kitchen COENZYME Q10 30 MG PO CAPS   Oral   Take 30 mg by mouth daily.         Marland Kitchen GABAPENTIN 300 MG PO CAPS   Oral   Take 300 mg by mouth daily.         Marland Kitchen GABAPENTIN 800 MG PO TABS   Oral   Take 800 mg by mouth at bedtime.         Marland Kitchen MAGNESIUM CHLORIDE ER  PO   Oral   Take by mouth.         . METHOCARBAMOL 500 MG PO TABS   Oral   Take 500 mg by mouth 2 (two) times daily.         Marland Kitchen OMEPRAZOLE 20 MG PO CPDR   Oral   Take 20 mg by mouth daily.         Marland Kitchen ONDANSETRON HCL 8 MG PO TABS   Oral   Take by mouth every 8 (eight) hours as needed.         Marland Kitchen PROCHLORPERAZINE MALEATE 10 MG PO TABS   Oral   Take 10 mg by mouth every 6 (six) hours as needed.         Marland Kitchen VITAMIN B6 PO   Oral   Take by mouth.         . TAMOXIFEN CITRATE 10 MG PO TABS   Oral   Take 10 mg by mouth 2 (two) times daily.         Marland Kitchen VITAMIN B-1 PO  Oral   Take by mouth.         Marland Kitchen VALACYCLOVIR HCL 500 MG PO TABS   Oral   Take 500 mg by mouth 2 (two) times daily.         . ASPIRIN 81 MG PO TABS   Oral   Take 81 mg by mouth daily.         . CELECOXIB 200 MG PO CAPS   Oral   Take 200 mg by mouth 2 (two) times daily.         Marland Kitchen CLONAZEPAM 0.5 MG PO TABS   Oral   Take 0.5 mg by mouth 2 (two) times daily as needed.         Marland Kitchen DILTIAZEM HCL 120 MG PO TABS   Oral   Take 120 mg by mouth 4 (four) times daily.         Marland Kitchen HYDROCHLOROTHIAZIDE 25 MG PO TABS   Oral   Take 25 mg by mouth daily.         Marland Kitchen HYDROCODONE-ACETAMINOPHEN 5-500 MG PO TABS   Oral   Take 1 tablet by mouth every 6 (six) hours as needed.         Marland Kitchen LABETALOL HCL 100 MG PO TABS   Oral   Take 100 mg by mouth 2 (two) times daily.         Marland Kitchen LORAZEPAM 0.5 MG PO TABS   Oral   Take 0.5 mg by mouth every 8 (eight) hours.         . ONDANSETRON HCL 4 MG PO TABS   Oral   Take 1 tablet (4 mg total) by mouth every 6 (six) hours.   12 tablet   0   . ZOLPIDEM TARTRATE 10 MG PO TABS   Oral   Take 10 mg by mouth at bedtime as needed.           BP 155/88  Pulse 105  Temp 99.3 F (37.4 C) (Oral)  Resp 20  Ht 6' (1.829 m)  Wt 203 lb (92.08 kg)  BMI 27.53 kg/m2  SpO2 98% Vitals reviewed Physical Exam Physical Examination: General appearance - alert, well  appearing, and in no distress Mental status - alert, oriented to person, place, and time Eyes - no scleral icterus, no conjunctival injection Mouth - mucous membranes moist, pharynx normal without lesions Chest - clear to auscultation, no wheezes, rales or rhonchi, symmetric air entry Heart - normal rate, regular rhythm, normal S1, S2, no murmurs, rubs, clicks or gallops Abdomen - soft, diffuse mild tenderness to palpation, no gaurding or rebound, nondistended, no masses or organomegaly Extremities - peripheral pulses normal, no pedal edema, no clubbing or cyanosis Skin - normal coloration and turgor, no rashes  ED Course  Procedures (including critical care time)  Labs Reviewed  URINALYSIS, ROUTINE W REFLEX MICROSCOPIC - Abnormal; Notable for the following:    APPearance CLOUDY (*)     Ketones, ur 15 (*)     All other components within normal limits  COMPREHENSIVE METABOLIC PANEL - Abnormal; Notable for the following:    Glucose, Bld 137 (*)     GFR calc non Af Amer 67 (*)     GFR calc Af Amer 78 (*)     All other components within normal limits  CBC  LIPASE, BLOOD   No results found.   1. Nausea vomiting and diarrhea       MDM  Pt presenting with c/o nausea/vomiting/diarrhea, no  fever, some abdominal pain, but no significant tenderness to palpation.  Pt had reassuring labs, urine.  Has gotten symptoms control with IV fluids and meds.  She has tolerated po fluids in the ED.  Low suspicion for acute emergent condtion requiring further management- suspect viral gastronteritis.  Discharged with strict return precautions.  Pt agreeable with plan.        Whitney Chick, MD 05/07/12 [phone]

## 2012-05-07 NOTE — ED Notes (Signed)
Pt reports onset of abdominal pain, nausea vomiting and diarrhea.

## 2014-01-04 ENCOUNTER — Emergency Department (HOSPITAL_BASED_OUTPATIENT_CLINIC_OR_DEPARTMENT_OTHER)
Admission: EM | Admit: 2014-01-04 | Discharge: 2014-01-04 | Disposition: A | Discharge status: Home / Self Care | Payer: BC Managed Care – PPO | Attending: Emergency Medicine | Admitting: Emergency Medicine

## 2014-01-04 ENCOUNTER — Encounter (HOSPITAL_BASED_OUTPATIENT_CLINIC_OR_DEPARTMENT_OTHER): Payer: Self-pay | Admitting: Emergency Medicine

## 2014-01-04 ENCOUNTER — Emergency Department (HOSPITAL_BASED_OUTPATIENT_CLINIC_OR_DEPARTMENT_OTHER): Payer: BC Managed Care – PPO

## 2014-01-04 DIAGNOSIS — Z853 Personal history of malignant neoplasm of breast: Secondary | ICD-10-CM | POA: Insufficient documentation

## 2014-01-04 DIAGNOSIS — Z7982 Long term (current) use of aspirin: Secondary | ICD-10-CM | POA: Insufficient documentation

## 2014-01-04 DIAGNOSIS — Z791 Long term (current) use of non-steroidal anti-inflammatories (NSAID): Secondary | ICD-10-CM | POA: Insufficient documentation

## 2014-01-04 DIAGNOSIS — S93402A Sprain of unspecified ligament of left ankle, initial encounter: Secondary | ICD-10-CM

## 2014-01-04 DIAGNOSIS — Z880 Allergy status to penicillin: Secondary | ICD-10-CM | POA: Insufficient documentation

## 2014-01-04 DIAGNOSIS — Y9389 Activity, other specified: Secondary | ICD-10-CM | POA: Insufficient documentation

## 2014-01-04 DIAGNOSIS — Z79899 Other long term (current) drug therapy: Secondary | ICD-10-CM | POA: Insufficient documentation

## 2014-01-04 DIAGNOSIS — W108XXA Fall (on) (from) other stairs and steps, initial encounter: Secondary | ICD-10-CM | POA: Insufficient documentation

## 2014-01-04 DIAGNOSIS — M79672 Pain in left foot: Secondary | ICD-10-CM

## 2014-01-04 DIAGNOSIS — I10 Essential (primary) hypertension: Secondary | ICD-10-CM | POA: Insufficient documentation

## 2014-01-04 DIAGNOSIS — Y929 Unspecified place or not applicable: Secondary | ICD-10-CM | POA: Insufficient documentation

## 2014-01-04 DIAGNOSIS — M129 Arthropathy, unspecified: Secondary | ICD-10-CM | POA: Insufficient documentation

## 2014-01-04 DIAGNOSIS — S93409A Sprain of unspecified ligament of unspecified ankle, initial encounter: Secondary | ICD-10-CM | POA: Insufficient documentation

## 2014-01-04 NOTE — ED Notes (Signed)
Patient transported to X-ray 

## 2014-01-04 NOTE — Discharge Instructions (Signed)

## 2014-01-04 NOTE — ED Provider Notes (Signed)
CSN: [id number]     Arrival date & time 01/04/14  1913 History   First MD Initiated Contact with Patient 01/04/14 1932     Chief Complaint  Patient presents with  . Foot Pain     (Consider location/radiation/quality/duration/timing/severity/associated sxs/prior Treatment) HPI Comments: Pt states that she fell down the steps 4 days ago and her foot twisted under her. Pt states that she has continued to have swelling and pain to her lateral foot and ankle.   Patient is a 63 y.o. female presenting with lower extremity pain. The history is provided by the patient. No language interpreter was used.  Foot Pain This is a new problem. The current episode started in the past 7 days. The problem occurs constantly. The problem has been unchanged. Pertinent negatives include no fever. The symptoms are aggravated by walking. She has tried NSAIDs for the symptoms. The treatment provided moderate relief.    Past Medical History  Diagnosis Date  . Breast cancer   . Hypertension   . Fibromyalgia   . Arthritis    Past Surgical History  Procedure Laterality Date  . Breast surgery    . Abdominal hysterectomy    . Cesarean section     History reviewed. No pertinent family history. History  Substance Use Topics  . Smoking status: Never Smoker   . Smokeless tobacco: Never Used  . Alcohol Use: Yes     Comment: occasional   OB History   Grav Para Term Preterm Abortions TAB SAB Ect Mult Living                 Review of Systems  Constitutional: Negative for fever.  Respiratory: Negative.   Cardiovascular: Negative.   Genitourinary: Negative.       Allergies  Codeine; Penicillins; Sulfa antibiotics; Tramadol; Aromasin; and Celexa  Home Medications   Prior to Admission medications   Medication Sig Start Date End Date Taking? Authorizing Provider  aspirin 81 MG tablet Take 81 mg by mouth daily.   Yes Historical Provider, MD  Biotin 1 MG CAPS Take by mouth.   Yes Historical Provider, MD   celecoxib (CELEBREX) 200 MG capsule Take 200 mg by mouth 2 (two) times daily.   Yes Historical Provider, MD  clonazePAM (KLONOPIN) 0.5 MG tablet Take 0.5 mg by mouth 2 (two) times daily as needed.   Yes Historical Provider, MD  co-enzyme Q-10 30 MG capsule Take 30 mg by mouth daily.   Yes Historical Provider, MD  gabapentin (NEURONTIN) 300 MG capsule Take 300 mg by mouth daily.   Yes Historical Provider, MD  gabapentin (NEURONTIN) 800 MG tablet Take 800 mg by mouth at bedtime.   Yes Historical Provider, MD  hydrochlorothiazide (HYDRODIURIL) 25 MG tablet Take 25 mg by mouth daily.   Yes Historical Provider, MD  HYDROcodone-acetaminophen (VICODIN) 5-500 MG per tablet Take 1 tablet by mouth every 6 (six) hours as needed.   Yes Historical Provider, MD  labetalol (NORMODYNE) 100 MG tablet Take 100 mg by mouth 2 (two) times daily.   Yes Historical Provider, MD  LORazepam (ATIVAN) 0.5 MG tablet Take 0.5 mg by mouth every 8 (eight) hours.   Yes Historical Provider, MD  MAGNESIUM CHLORIDE ER PO Take by mouth.   Yes Historical Provider, MD  methocarbamol (ROBAXIN) 500 MG tablet Take 500 mg by mouth 2 (two) times daily.   Yes Historical Provider, MD  omeprazole (PRILOSEC) 20 MG capsule Take 20 mg by mouth daily.   Yes Historical Provider,  MD  Pyridoxine HCl (VITAMIN B6 PO) Take by mouth.   Yes Historical Provider, MD  Thiamine HCl (VITAMIN B-1 PO) Take by mouth.   Yes Historical Provider, MD  valACYclovir (VALTREX) 500 MG tablet Take 500 mg by mouth 2 (two) times daily.   Yes Historical Provider, MD  zolpidem (AMBIEN) 10 MG tablet Take 10 mg by mouth at bedtime as needed.   Yes Historical Provider, MD  diltiazem (CARDIZEM) 120 MG tablet Take 120 mg by mouth 4 (four) times daily.    Historical Provider, MD  ondansetron (ZOFRAN) 4 MG tablet Take 1 tablet (4 mg total) by mouth every 6 (six) hours. 05/07/12   Threasa Beards, MD  ondansetron (ZOFRAN) 8 MG tablet Take by mouth every 8 (eight) hours as needed.     Historical Provider, MD  prochlorperazine (COMPAZINE) 10 MG tablet Take 10 mg by mouth every 6 (six) hours as needed.    Historical Provider, MD  tamoxifen (NOLVADEX) 10 MG tablet Take 10 mg by mouth 2 (two) times daily.    Historical Provider, MD   BP 155/96  Pulse 76  Temp(Src) 98.2 F (36.8 C) (Oral)  Resp 18  Ht 6' (1.829 m)  Wt 209 lb (94.802 kg)  BMI 28.34 kg/m2  SpO2 99% Physical Exam  Nursing note and vitals reviewed. Constitutional: She is oriented to person, place, and time. She appears well-developed and well-nourished.  Cardiovascular: Normal rate and regular rhythm.   Pulmonary/Chest: Effort normal and breath sounds normal.  Musculoskeletal: Normal range of motion.  Obvious swelling to the foot and ankle. Tenderness on the lateral aspect. Full rom. Pulses intact  Neurological: She is alert and oriented to person, place, and time.  Skin: Skin is warm and dry.  Psychiatric: She has a normal mood and affect.    ED Course  Procedures (including critical care time) Labs Review Labs Reviewed - No data to display  Imaging Review Dg Ankle Complete Left  01/04/2014   CLINICAL DATA:  Fall down stairs.  Ankle injury and pain.  EXAM: LEFT ANKLE COMPLETE - 3+ VIEW  COMPARISON:  None.  FINDINGS: There is no evidence of fracture, dislocation, or joint effusion. There is no evidence of arthropathy or other focal bone abnormality. Mild soft tissue swelling. Peripheral vascular calcification noted. Plantar calcaneal bone spur also seen.  IMPRESSION: Soft tissue swelling.  No evidence of fracture or dislocation.   Electronically Signed   By: Earle Gell M.D.   On: 01/04/2014 20:11   Dg Foot Complete Left  01/04/2014   CLINICAL DATA:  Pain post  EXAM: LEFT FOOT - COMPLETE 3+ VIEW  COMPARISON:  None.  FINDINGS: Frontal, oblique, and lateral views were obtained. There is no fracture or dislocation. Joint spaces appear intact. There is slight spurring in the dorsal midfoot. There is a small  inferior calcaneal spur.  IMPRESSION: No demonstrable fracture or dislocation. Small calcaneal spur inferiorly. Mild spurring dorsal midfoot.   Electronically Signed   By: Lowella Grip M.D.   On: 01/04/2014 19:58     EKG Interpretation None      MDM   Final diagnoses:  Ankle sprain, left, initial encounter  Left foot pain    No acute bony abnormality noted. Placed in ace wrap for comfort. Denies need for any further pain medication at home    Glendell Docker, NP 01/04/14 2024

## 2014-01-04 NOTE — ED Notes (Signed)
Pt reports falling down steps on Sunday, bruised left foot and toes

## 2014-01-05 NOTE — ED Provider Notes (Signed)
Medical screening examination/treatment/procedure(s) were performed by non-physician practitioner and as supervising physician I was immediately available for consultation/collaboration.   EKG Interpretation None        Houston Siren III, MD 01/05/14 [phone]

## 2014-10-23 ENCOUNTER — Emergency Department (HOSPITAL_BASED_OUTPATIENT_CLINIC_OR_DEPARTMENT_OTHER)
Admission: EM | Admit: 2014-10-23 | Discharge: 2014-10-23 | Disposition: A | Discharge status: Home / Self Care | Payer: 59 | Attending: Emergency Medicine | Admitting: Emergency Medicine

## 2014-10-23 ENCOUNTER — Encounter (HOSPITAL_BASED_OUTPATIENT_CLINIC_OR_DEPARTMENT_OTHER): Payer: Self-pay | Admitting: *Deleted

## 2014-10-23 ENCOUNTER — Emergency Department (HOSPITAL_BASED_OUTPATIENT_CLINIC_OR_DEPARTMENT_OTHER): Payer: 59

## 2014-10-23 DIAGNOSIS — M1990 Unspecified osteoarthritis, unspecified site: Secondary | ICD-10-CM | POA: Insufficient documentation

## 2014-10-23 DIAGNOSIS — Z7982 Long term (current) use of aspirin: Secondary | ICD-10-CM | POA: Diagnosis not present

## 2014-10-23 DIAGNOSIS — Z880 Allergy status to penicillin: Secondary | ICD-10-CM | POA: Diagnosis not present

## 2014-10-23 DIAGNOSIS — Z853 Personal history of malignant neoplasm of breast: Secondary | ICD-10-CM | POA: Diagnosis not present

## 2014-10-23 DIAGNOSIS — M7981 Nontraumatic hematoma of soft tissue: Secondary | ICD-10-CM | POA: Diagnosis not present

## 2014-10-23 DIAGNOSIS — Z791 Long term (current) use of non-steroidal anti-inflammatories (NSAID): Secondary | ICD-10-CM | POA: Diagnosis not present

## 2014-10-23 DIAGNOSIS — M79662 Pain in left lower leg: Secondary | ICD-10-CM | POA: Insufficient documentation

## 2014-10-23 DIAGNOSIS — Z79899 Other long term (current) drug therapy: Secondary | ICD-10-CM | POA: Insufficient documentation

## 2014-10-23 DIAGNOSIS — M797 Fibromyalgia: Secondary | ICD-10-CM | POA: Diagnosis not present

## 2014-10-23 DIAGNOSIS — I10 Essential (primary) hypertension: Secondary | ICD-10-CM | POA: Insufficient documentation

## 2014-10-23 NOTE — Discharge Instructions (Signed)
Apply heating pad several times daily for the next 2 days.  Return to the emergency department if your symptoms significantly worsen or change.

## 2014-10-23 NOTE — ED Notes (Signed)
Knot and pain on the back of her left lower leg x 3 days.

## 2014-10-23 NOTE — ED Provider Notes (Addendum)
CSN: [id number]     Arrival date & time 10/23/14  2001 History   First MD Initiated Contact with Patient 10/23/14 2146     Chief Complaint  Patient presents with  . Leg Pain     (Consider location/radiation/quality/duration/timing/severity/associated sxs/prior Treatment) HPI Comments: Patient is a 64 year old female who presents for evaluation of swelling to the back of her calf in the absence of any injury or trauma. She is concerned about the possibility of a blood clot. She denies any chest pain or shortness of breath.  Patient is a 64 y.o. female presenting with leg pain. The history is provided by the patient.  Leg Pain Lower extremity pain location: Calf. Time since incident:  2 days Injury: no   Pain details:    Severity:  Moderate   Onset quality:  Sudden   Duration:  2 days   Timing:  Constant   Progression:  Unchanged Chronicity:  New Worsened by:  Bearing weight (Walking)   Past Medical History  Diagnosis Date  . Breast cancer   . Hypertension   . Fibromyalgia   . Arthritis    Past Surgical History  Procedure Laterality Date  . Breast surgery    . Abdominal hysterectomy    . Cesarean section     No family history on file. History  Substance Use Topics  . Smoking status: Never Smoker   . Smokeless tobacco: Never Used  . Alcohol Use: Yes     Comment: occasional   OB History    No data available     Review of Systems  All other systems reviewed and are negative.     Allergies  Codeine; Penicillins; Sulfa antibiotics; Tramadol; Aromasin; and Celexa  Home Medications   Prior to Admission medications   Medication Sig Start Date End Date Taking? Authorizing Provider  aspirin 81 MG tablet Take 81 mg by mouth daily.    Historical Provider, MD  Biotin 1 MG CAPS Take by mouth.    Historical Provider, MD  celecoxib (CELEBREX) 200 MG capsule Take 200 mg by mouth 2 (two) times daily.    Historical Provider, MD  clonazePAM (KLONOPIN) 0.5 MG tablet Take  0.5 mg by mouth 2 (two) times daily as needed.    Historical Provider, MD  co-enzyme Q-10 30 MG capsule Take 30 mg by mouth daily.    Historical Provider, MD  diltiazem (CARDIZEM) 120 MG tablet Take 120 mg by mouth 4 (four) times daily.    Historical Provider, MD  gabapentin (NEURONTIN) 300 MG capsule Take 300 mg by mouth daily.    Historical Provider, MD  gabapentin (NEURONTIN) 800 MG tablet Take 800 mg by mouth at bedtime.    Historical Provider, MD  hydrochlorothiazide (HYDRODIURIL) 25 MG tablet Take 25 mg by mouth daily.    Historical Provider, MD  HYDROcodone-acetaminophen (VICODIN) 5-500 MG per tablet Take 1 tablet by mouth every 6 (six) hours as needed.    Historical Provider, MD  labetalol (NORMODYNE) 100 MG tablet Take 100 mg by mouth 2 (two) times daily.    Historical Provider, MD  LORazepam (ATIVAN) 0.5 MG tablet Take 0.5 mg by mouth every 8 (eight) hours.    Historical Provider, MD  MAGNESIUM CHLORIDE ER PO Take by mouth.    Historical Provider, MD  methocarbamol (ROBAXIN) 500 MG tablet Take 500 mg by mouth 2 (two) times daily.    Historical Provider, MD  omeprazole (PRILOSEC) 20 MG capsule Take 20 mg by mouth daily.  Historical Provider, MD  ondansetron (ZOFRAN) 4 MG tablet Take 1 tablet (4 mg total) by mouth every 6 (six) hours. 05/07/12   Alfonzo Beers, MD  ondansetron (ZOFRAN) 8 MG tablet Take by mouth every 8 (eight) hours as needed.    Historical Provider, MD  prochlorperazine (COMPAZINE) 10 MG tablet Take 10 mg by mouth every 6 (six) hours as needed.    Historical Provider, MD  Pyridoxine HCl (VITAMIN B6 PO) Take by mouth.    Historical Provider, MD  tamoxifen (NOLVADEX) 10 MG tablet Take 10 mg by mouth 2 (two) times daily.    Historical Provider, MD  Thiamine HCl (VITAMIN B-1 PO) Take by mouth.    Historical Provider, MD  valACYclovir (VALTREX) 500 MG tablet Take 500 mg by mouth 2 (two) times daily.    Historical Provider, MD  zolpidem (AMBIEN) 10 MG tablet Take 10 mg by mouth  at bedtime as needed.    Historical Provider, MD   BP 151/71 mmHg  Pulse 60  Temp(Src) 98.3 F (36.8 C) (Oral)  Resp 18  Ht 6' (1.829 m)  Wt 209 lb (94.802 kg)  BMI 28.34 kg/m2  SpO2 99% Physical Exam  Constitutional: She is oriented to person, place, and time. She appears well-developed and well-nourished. No distress.  HENT:  Head: Normocephalic and atraumatic.  Neck: Normal range of motion. Neck supple.  Cardiovascular: Normal rate and regular rhythm.  Exam reveals no gallop and no friction rub.   No murmur heard. Pulmonary/Chest: Effort normal and breath sounds normal. No respiratory distress. She has no wheezes.  Abdominal: Soft. Bowel sounds are normal. She exhibits no distension. There is no tenderness.  Musculoskeletal: Normal range of motion.  There is a tender 1 cm indurated area to the back of the left calf. There is surrounding ecchymosis. Homans sign is absent. Distal pulses are easily palpable.  Neurological: She is alert and oriented to person, place, and time.  Skin: Skin is warm and dry. She is not diaphoretic.  Nursing note and vitals reviewed.   ED Course  Procedures (including critical care time) Labs Review Labs Reviewed - No data to display  Imaging Review US Venous Img Lower Unilateral Left  10/23/2014   CLINICAL DATA:  Mass at the left upper posterior calf for 4 days, with associated pain. Initial encounter.  EXAM: LEFT LOWER EXTREMITY VENOUS DOPPLER ULTRASOUND  TECHNIQUE: Gray-scale sonography with graded compression, as well as color Doppler and duplex ultrasound were performed to evaluate the lower extremity deep venous systems from the level of the common femoral vein and including the common femoral, femoral, profunda femoral, popliteal and calf veins including the posterior tibial, peroneal and gastrocnemius veins when visible. The superficial great saphenous vein was also interrogated. Spectral Doppler was utilized to evaluate flow at rest and with  distal augmentation maneuvers in the common femoral, femoral and popliteal veins.  COMPARISON:  None.  FINDINGS: Contralateral Common Femoral Vein: Respiratory phasicity is normal and symmetric with the symptomatic side. No evidence of thrombus. Normal compressibility.  Common Femoral Vein: No evidence of thrombus. Normal compressibility, respiratory phasicity and response to augmentation.  Saphenofemoral Junction: No evidence of thrombus. Normal compressibility and flow on color Doppler imaging.  Profunda Femoral Vein: No evidence of thrombus. Normal compressibility and flow on color Doppler imaging.  Femoral Vein: No evidence of thrombus. Normal compressibility, respiratory phasicity and response to augmentation.  Popliteal Vein: No evidence of thrombus. Normal compressibility, respiratory phasicity and response to augmentation.  Calf Veins: No  evidence of thrombus. Normal compressibility and flow on color Doppler imaging.  Superficial Great Saphenous Vein: No evidence of thrombus. Normal compressibility and flow on color Doppler imaging.  Venous Reflux:  None.  Other Findings: The palpable clinical finding corresponds to an 8 x 5 x 5 mm focal collection of fluid surrounding a lobule of fat, at the left posterior upper calf; this may reflect traumatic injury, given sudden onset. No associated blood flow is noted on limited color Doppler evaluation.  IMPRESSION: 1. No evidence of deep venous thrombosis. 2. Palpable clinical finding at the left posterior upper calf corresponds to an 8 x 5 x 5 mm collection of fluid surrounding a lobule of fat. This may reflect traumatic injury, given the sudden onset. No associated abnormal blood flow seen.   Electronically Signed   By: Garald Balding M.D.   On: 10/23/2014 23:10     EKG Interpretation None      MDM   Final diagnoses:  Calf pain, left    Ultrasound negative for DVT. This appears to be a hematoma. Will recommend warm compresses and when necessary  return.    Veryl Speak, MD 10/23/14 [id number]  Veryl Speak, MD 10/24/14 2001

## 2016-04-09 ENCOUNTER — Emergency Department (HOSPITAL_BASED_OUTPATIENT_CLINIC_OR_DEPARTMENT_OTHER)
Admission: EM | Admit: 2016-04-09 | Discharge: 2016-04-09 | Disposition: A | Discharge status: Home / Self Care | Payer: BLUE CROSS/BLUE SHIELD | Attending: Physician Assistant | Admitting: Physician Assistant

## 2016-04-09 ENCOUNTER — Encounter (HOSPITAL_BASED_OUTPATIENT_CLINIC_OR_DEPARTMENT_OTHER): Payer: Self-pay | Admitting: *Deleted

## 2016-04-09 DIAGNOSIS — Z7982 Long term (current) use of aspirin: Secondary | ICD-10-CM | POA: Diagnosis not present

## 2016-04-09 DIAGNOSIS — Z853 Personal history of malignant neoplasm of breast: Secondary | ICD-10-CM | POA: Diagnosis not present

## 2016-04-09 DIAGNOSIS — I10 Essential (primary) hypertension: Secondary | ICD-10-CM | POA: Diagnosis not present

## 2016-04-09 DIAGNOSIS — N3000 Acute cystitis without hematuria: Secondary | ICD-10-CM | POA: Diagnosis not present

## 2016-04-09 DIAGNOSIS — R300 Dysuria: Secondary | ICD-10-CM | POA: Diagnosis present

## 2016-04-09 DIAGNOSIS — Z79899 Other long term (current) drug therapy: Secondary | ICD-10-CM | POA: Diagnosis not present

## 2016-04-09 LAB — URINALYSIS, ROUTINE W REFLEX MICROSCOPIC
Bilirubin Urine: NEGATIVE
Glucose, UA: NEGATIVE mg/dL
HGB URINE DIPSTICK: NEGATIVE
Ketones, ur: NEGATIVE mg/dL
Nitrite: NEGATIVE
PROTEIN: NEGATIVE mg/dL
Specific Gravity, Urine: 1.02 (ref 1.005–1.030)
pH: 6.5 (ref 5.0–8.0)

## 2016-04-09 LAB — URINE MICROSCOPIC-ADD ON: RBC / HPF: NONE SEEN RBC/hpf (ref 0–5)

## 2016-04-09 MED ORDER — FLUCONAZOLE 50 MG PO TABS: 150.0000 mg | ORAL_TABLET | Freq: Once | ORAL | Status: DC

## 2016-04-09 MED ORDER — CEPHALEXIN 250 MG PO CAPS
500.0000 mg | ORAL_CAPSULE | Freq: Once | ORAL | Status: AC
Start: 2016-04-09 — End: 2016-04-09
  Administered 2016-04-09: 500 mg via ORAL
  Filled 2016-04-09: qty 2

## 2016-04-09 MED ORDER — FLUCONAZOLE 200 MG PO TABS: 200.0000 mg | ORAL_TABLET | Freq: Once | ORAL | 0 refills | Status: AC

## 2016-04-09 MED ORDER — CEPHALEXIN 500 MG PO CAPS: 500.0000 mg | ORAL_CAPSULE | Freq: Four times a day (QID) | ORAL | 0 refills | Status: DC

## 2016-04-09 NOTE — ED Triage Notes (Signed)
Pt c/o freq painful urination x 5 days

## 2016-04-09 NOTE — ED Provider Notes (Signed)
[facility] Provider Note   CSN: [id number] Arrival date & time: 04/09/16  1937   By signing my name below, I, Delton Prairie, attest that this documentation has been prepared under the direction and in the presence of Courteney Julio Alm, MD  Electronically Signed: Delton Prairie, ED Scribe. 04/09/16. 8:56 PM.   History   Chief Complaint Chief Complaint  Patient presents with  . Dysuria     The history is provided by the patient. No language interpreter was used.   HPI Comments:  Whitney Ward is a 65 y.o. female, with a hx of UTIs, who presents to the Emergency Department complaining of dysuria x 5 days. Pt notes associated bilateral flank pain and diffuse lower abdominal pain exacerbated with sitting and standing. Pt has taken cranberry pills with no relief. Pt denies nausea, vomiting, diarrhea, headaches and fevers.   Past Medical History:  Diagnosis Date  . Arthritis   . Breast cancer ([place])   . Breast cancer ([place])   . Fibromyalgia   . Hypertension     There are no active problems to display for this patient.   Past Surgical History:  Procedure Laterality Date  . ABDOMINAL HYSTERECTOMY    . BREAST SURGERY    . CESAREAN SECTION      OB History    No data available       Home Medications    Prior to Admission medications   Medication Sig Start Date End Date Taking? Authorizing Provider  traZODone (DESYREL) 150 MG tablet Take by mouth.   Yes Historical Provider, MD  aspirin 81 MG tablet Take 81 mg by mouth daily.    Historical Provider, MD  Biotin 1 MG CAPS Take by mouth.    Historical Provider, MD  celecoxib (CELEBREX) 200 MG capsule Take 200 mg by mouth 2 (two) times daily.    Historical Provider, MD  clonazePAM (KLONOPIN) 0.5 MG tablet Take 0.5 mg by mouth 2 (two) times daily as needed.    Historical Provider, MD  co-enzyme Q-10 30 MG capsule Take 30 mg by mouth daily.    Historical Provider, MD  diltiazem (CARDIZEM) 120 MG tablet Take  120 mg by mouth 4 (four) times daily.    Historical Provider, MD  gabapentin (NEURONTIN) 300 MG capsule Take 300 mg by mouth daily.    Historical Provider, MD  gabapentin (NEURONTIN) 800 MG tablet Take 800 mg by mouth at bedtime.    Historical Provider, MD  hydrochlorothiazide (HYDRODIURIL) 25 MG tablet Take 25 mg by mouth daily.    Historical Provider, MD  HYDROcodone-acetaminophen (VICODIN) 5-500 MG per tablet Take 1 tablet by mouth every 6 (six) hours as needed.    Historical Provider, MD  labetalol (NORMODYNE) 100 MG tablet Take 100 mg by mouth 2 (two) times daily.    Historical Provider, MD  LORazepam (ATIVAN) 0.5 MG tablet Take 0.5 mg by mouth every 8 (eight) hours.    Historical Provider, MD  MAGNESIUM CHLORIDE ER PO Take by mouth.    Historical Provider, MD  methocarbamol (ROBAXIN) 500 MG tablet Take 500 mg by mouth 2 (two) times daily.    Historical Provider, MD  omeprazole (PRILOSEC) 20 MG capsule Take 20 mg by mouth daily.    Historical Provider, MD  ondansetron (ZOFRAN) 4 MG tablet Take 1 tablet (4 mg total) by mouth every 6 (six) hours. 05/07/12   Alfonzo Beers, MD  ondansetron (ZOFRAN) 8 MG tablet Take by mouth every 8 (eight) hours as  needed.    Historical Provider, MD  prochlorperazine (COMPAZINE) 10 MG tablet Take 10 mg by mouth every 6 (six) hours as needed.    Historical Provider, MD  Pyridoxine HCl (VITAMIN B6 PO) Take by mouth.    Historical Provider, MD  tamoxifen (NOLVADEX) 10 MG tablet Take 10 mg by mouth 2 (two) times daily.    Historical Provider, MD  Thiamine HCl (VITAMIN B-1 PO) Take by mouth.    Historical Provider, MD  valACYclovir (VALTREX) 500 MG tablet Take 500 mg by mouth 2 (two) times daily.    Historical Provider, MD    Family History History reviewed. No pertinent family history.  Social History Social History  Substance Use Topics  . Smoking status: Never Smoker  . Smokeless tobacco: Never Used  . Alcohol use Yes     Comment: occasional      Allergies   Codeine; Penicillins; Sulfa antibiotics; Tramadol; Aromasin [exemestane]; and Celexa [citalopram hydrobromide]   Review of Systems Review of Systems  Constitutional: Negative for fever.  Gastrointestinal: Positive for abdominal pain. Negative for diarrhea, nausea and vomiting.  Genitourinary: Positive for dysuria and flank pain.  Neurological: Negative for headaches.  All other systems reviewed and are negative.    Physical Exam Updated Vital Signs BP 168/87   Pulse 67   Temp 97.8 F (36.6 C)   Resp 18   Ht 6' (1.829 m)   Wt 205 lb (93 kg)   SpO2 100%   BMI 27.80 kg/m   Physical Exam  Constitutional: She is oriented to person, place, and time. She appears well-developed and well-nourished. No distress.  HENT:  Head: Normocephalic and atraumatic.  Eyes: Conjunctivae are normal.  Cardiovascular: Normal rate.   Pulmonary/Chest: Effort normal.  Abdominal: She exhibits no distension. There is no tenderness.  Neurological: She is alert and oriented to person, place, and time.  Skin: Skin is warm and dry.  Psychiatric: She has a normal mood and affect.  Nursing note and vitals reviewed.    ED Treatments / Results  DIAGNOSTIC STUDIES:  Oxygen Saturation is 100% on RA, normal by my interpretation.    COORDINATION OF CARE:  8:54 PM Discussed treatment plan with pt at bedside and pt agreed to plan.  Labs (all labs ordered are listed, but only abnormal results are displayed) Labs Reviewed  URINALYSIS, ROUTINE W REFLEX MICROSCOPIC (NOT AT [facility]) - Abnormal; Notable for the following:       Result Value   Leukocytes, UA SMALL (*)    All other components within normal limits  URINE MICROSCOPIC-ADD ON - Abnormal; Notable for the following:    Squamous Epithelial / LPF 0-5 (*)    Bacteria, UA RARE (*)    All other components within normal limits  URINE CULTURE    EKG  EKG Interpretation None       Radiology No results  found.  Procedures Procedures (including critical care time)  Medications Ordered in ED Medications  cephALEXin (KEFLEX) capsule 500 mg (not administered)     Initial Impression / Assessment and Plan / ED Course  I have reviewed the triage vital signs and the nursing notes.  Pertinent labs & imaging results that were available during my care of the patient were reviewed by me and considered in my medical decision making (see chart for details).  Clinical Course    Patient is a 65 year old female presenting with you urinary frequency and burning with urination. Patient's been taking a lot of cranberry pills. Patient  has history of multiple UTIs in the past. She has been eating drinking normally. No fevers. We will treat for urinary tract infection given the symptoms and have her follow-up with a primary care physician. Return precautions express and patient understand.  Patient is comfortable, ambulatory, and taking PO at time of discharge.  Patient expressed understanding about return precautions.    Final Clinical Impressions(s) / ED Diagnoses   Final diagnoses:  None    New Prescriptions New Prescriptions   No medications on file  I personally performed the services described in this documentation, which was scribed in my presence. The recorded information has been reviewed and is accurate.       Courteney Julio Alm, MD 04/09/16 2126

## 2016-04-09 NOTE — Discharge Instructions (Signed)
Please return with any concerns, fever, vomiting.  Have a urinary tract infection please take antibiotics as prescribed.

## 2016-04-11 LAB — URINE CULTURE

## 2016-04-16 ENCOUNTER — Ambulatory Visit (INDEPENDENT_AMBULATORY_CARE_PROVIDER_SITE_OTHER): Payer: BLUE CROSS/BLUE SHIELD | Admitting: Rheumatology

## 2016-04-16 DIAGNOSIS — M797 Fibromyalgia: Secondary | ICD-10-CM

## 2016-04-16 DIAGNOSIS — M7071 Other bursitis of hip, right hip: Secondary | ICD-10-CM

## 2016-04-16 DIAGNOSIS — G4709 Other insomnia: Secondary | ICD-10-CM

## 2016-04-16 DIAGNOSIS — R5381 Other malaise: Secondary | ICD-10-CM

## 2016-05-27 ENCOUNTER — Encounter: Payer: Self-pay | Admitting: Rheumatology

## 2016-05-27 ENCOUNTER — Ambulatory Visit (INDEPENDENT_AMBULATORY_CARE_PROVIDER_SITE_OTHER): Payer: BLUE CROSS/BLUE SHIELD | Admitting: Rheumatology

## 2016-05-27 VITALS — BP 133/76 | HR 78 | Resp 14 | Ht 72.0 in | Wt 234.0 lb

## 2016-05-27 DIAGNOSIS — A6000 Herpesviral infection of urogenital system, unspecified: Secondary | ICD-10-CM | POA: Diagnosis not present

## 2016-05-27 DIAGNOSIS — M7061 Trochanteric bursitis, right hip: Secondary | ICD-10-CM | POA: Diagnosis not present

## 2016-05-27 DIAGNOSIS — I10 Essential (primary) hypertension: Secondary | ICD-10-CM | POA: Diagnosis not present

## 2016-05-27 DIAGNOSIS — M19042 Primary osteoarthritis, left hand: Secondary | ICD-10-CM

## 2016-05-27 DIAGNOSIS — K219 Gastro-esophageal reflux disease without esophagitis: Secondary | ICD-10-CM

## 2016-05-27 DIAGNOSIS — C50919 Malignant neoplasm of unspecified site of unspecified female breast: Secondary | ICD-10-CM

## 2016-05-27 DIAGNOSIS — N189 Chronic kidney disease, unspecified: Secondary | ICD-10-CM

## 2016-05-27 DIAGNOSIS — G2581 Restless legs syndrome: Secondary | ICD-10-CM

## 2016-05-27 DIAGNOSIS — M67911 Unspecified disorder of synovium and tendon, right shoulder: Secondary | ICD-10-CM

## 2016-05-27 DIAGNOSIS — R5383 Other fatigue: Secondary | ICD-10-CM | POA: Diagnosis not present

## 2016-05-27 DIAGNOSIS — M19041 Primary osteoarthritis, right hand: Secondary | ICD-10-CM | POA: Diagnosis not present

## 2016-05-27 DIAGNOSIS — M797 Fibromyalgia: Secondary | ICD-10-CM | POA: Diagnosis not present

## 2016-05-27 DIAGNOSIS — F5101 Primary insomnia: Secondary | ICD-10-CM

## 2016-05-27 MED ORDER — LIDOCAINE HCL 1 % IJ SOLN
1.0000 mL | INTRAMUSCULAR | Status: AC | PRN
  Administered 2016-05-27: 1 mL

## 2016-05-27 MED ORDER — TRIAMCINOLONE ACETONIDE 40 MG/ML IJ SUSP
40.0000 mg | INTRAMUSCULAR | Status: AC | PRN
  Administered 2016-05-27: 40 mg via INTRA_ARTICULAR

## 2016-05-27 MED ORDER — LIDOCAINE HCL 1 % IJ SOLN
1.5000 mL | INTRAMUSCULAR | Status: AC | PRN
  Administered 2016-05-27: 1.5 mL

## 2016-05-27 NOTE — Progress Notes (Signed)
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 right-handed  female with history of fibromyalgia syndrome. She states that her right shoulder has been hurting for about 3-4 weeks she can manage the pain during the daytime but she has discomfort at nighttime. She's also having discomfort in her right trochanteric area which is causing difficulty walking and getting up from chair. Her fibromyalgia has also flared with increased pain and discomfort she has lot of pain in her neck and thoracic area. She describes her pain on scale of 0-10 about 6 and fatigue on 0-10 about 7. She continues to have morning stiffness and insomnia.    Activities of Daily Living:  Patient reports morning stiffness for 1 hour.   Patient Reports nocturnal pain.  Difficulty dressing/grooming: Denies Difficulty climbing stairs: Reports Difficulty getting out of chair: Reports Difficulty using hands for taps, buttons, cutlery, and/or writing: Denies   Review of Systems  Constitutional: Positive for fatigue and weakness. Negative for night sweats, weight gain and weight loss.  HENT: Negative for mouth sores, trouble swallowing, trouble swallowing, mouth dryness and nose dryness.   Eyes: Positive for dryness. Negative for pain, redness and visual disturbance.  Respiratory: Negative for cough, shortness of breath and difficulty breathing.   Cardiovascular: Negative for chest pain, palpitations, hypertension, irregular heartbeat and swelling in legs/feet.  Gastrointestinal: Negative for blood in stool, constipation and diarrhea.  Endocrine: Negative for increased urination.  Genitourinary: Negative for vaginal dryness.    Musculoskeletal: Positive for arthralgias, joint pain, myalgias, morning stiffness and myalgias. Negative for joint swelling, muscle weakness and muscle tenderness.  Skin: Negative for color change, rash, hair loss, skin tightness, ulcers and sensitivity to sunlight.  Allergic/Immunologic: Negative for susceptible to infections.  Neurological: Negative for dizziness, memory loss and night sweats.  Hematological: Negative for swollen glands.  Psychiatric/Behavioral: Positive for depressed mood and sleep disturbance. The patient is nervous/anxious.     PMFS History:  There are no active problems to display for this patient.   Past Medical History:  Diagnosis Date  . Arthritis   . Breast cancer ([place])   . Breast cancer ([place])   . Fibromyalgia   . Hypertension     History reviewed. No pertinent family history. Past Surgical History:  Procedure Laterality Date  . ABDOMINAL HYSTERECTOMY    . BREAST SURGERY    . CESAREAN SECTION     Social History   Social History Narrative  . No narrative on file     Objective: Vital Signs: BP 133/76 (BP Location: Right Arm, Patient Position: Sitting, Cuff Size: Large)   Pulse 78   Resp 14   Ht 6' (1.829 m)   Wt 234 lb (106.1 kg)   BMI 31.74 kg/m    Physical Exam  Constitutional: She is oriented to person, place, and time. She appears well-developed and well-nourished.  HENT:  Head: Normocephalic and atraumatic.  Eyes: Conjunctivae and EOM are normal.  Neck: Normal range of motion.  Cardiovascular: Normal rate, regular rhythm, normal heart sounds and intact distal pulses.   Pulmonary/Chest:  Effort normal and breath sounds normal.  Abdominal: Soft. Bowel sounds are normal.  Lymphadenopathy:    She has no cervical adenopathy.  Neurological: She is alert and oriented to person, place, and time.  Skin: Skin is warm and dry. Capillary refill takes less than 2 seconds.  Psychiatric: She has a normal mood and affect. Her behavior is normal.   Nursing note and vitals reviewed.    Musculoskeletal Exam: C-spine, thoracic spine, lumbar spine good range of motion. She has good range of motion of bilateral shoulder joints although the right shoulder joint was painful range of motion. She has discomfort with the internal rotation of her right shoulder. Elbow joints wrist joint MCPs PIPs DIPs with good range of motion. Hip joints knee joints ankles MTPs PIPs with good range of motion. She has tenderness on palpation over right trochanteric bursa area consistent with trochanteric bursitis. Fibromyalgia tender points were 16 out of 18 positive.  CDAI Exam: No CDAI exam completed.    Investigation: Findings:  04/16/2016 CBC WBC 3.5 CMP glucose 128    Imaging: No results found.  Speciality Comments: No specialty comments available.    Procedures:  Large Joint Inj Date/Time: 05/27/2016 2:26 PM Performed by: Bo Merino Authorized by: Bo Merino   Consent Given by:  Patient Site marked: the procedure site was marked   Timeout: prior to procedure the correct patient, procedure, and site was verified   Indications:  Pain Location:  Shoulder Site:  R glenohumeral Prep: patient was prepped and draped in usual sterile fashion   Needle Size:  27 G Needle Length:  1.5 inches Approach:  Posterior Ultrasound Guidance: No   Fluoroscopic Guidance: No   Arthrogram: No   Medications:  1 mL lidocaine 1 %; 40 mg triamcinolone acetonide 40 MG/ML Aspiration Attempted: Yes   Aspirate amount (mL):  0 Patient tolerance:  Patient tolerated the procedure well with no immediate complications Large Joint Inj Date/Time: 05/27/2016 2:26 PM Performed by: Bo Merino Authorized by: Bo Merino   Consent Given by:  Patient Site marked: the procedure site was marked   Timeout: prior to procedure the correct patient, procedure, and site was verified   Indications:  Pain Location:  Hip Site:  R greater  trochanter Prep: patient was prepped and draped in usual sterile fashion   Needle Size:  27 G Needle Length:  1.5 inches Approach:  Lateral Ultrasound Guidance: No   Fluoroscopic Guidance: No   Arthrogram: No   Medications:  40 mg triamcinolone acetonide 40 MG/ML; 1.5 mL lidocaine 1 % Aspiration Attempted: No   Aspirate amount (mL):  0 Patient tolerance:  Patient tolerated the procedure well with no immediate complications   Allergies: Codeine; Penicillins; Sulfa antibiotics; Tramadol; Aromasin [exemestane]; Atorvastatin; Celexa [citalopram hydrobromide]; and Citalopram   Assessment / Plan:     Visit Diagnoses: Fibromyalgia: She is having a flare with increased pain and discomfort and positive tender points.  Primary insomnia: She is chronic insomnia good sleep hygiene was discussed.  Fatigue, colon metastases related to chronic insomnia  Tendinopathy of right shoulder: She had a lot of pain and discomfort with range of motion. After different treatment options were discussed and informed consent was obtained right shoulder joint was prepped in sterile fashion and injected with steroid as described above she tolerated the procedure well.  Trochanteric bursitis of right hip: She continues to have normal discomfort in her right trochanteric area informed consent was obtained in the right trochanter was also injected with cortisone  as described above.  Primary osteoarthritis of both hands: Joint protection and muscle strengthening was discussed.  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 Large Joint  Injection/Arthrocentesis  . Large Joint Injection/Arthrocentesis   No orders of the defined types were placed in this encounter.   Face-to-face time spent with patient was 30 minutes. 50% of time was spent in counseling and coordination of care.  Follow-Up Instructions: Return in about 6 months (around 11/24/2016) for Fibromyalgia.   Bo Merino, MD

## 2016-09-02 ENCOUNTER — Other Ambulatory Visit: Payer: Self-pay | Admitting: Rheumatology

## 2016-09-02 NOTE — Telephone Encounter (Signed)
ok 

## 2016-09-02 NOTE — Telephone Encounter (Signed)
Last Visit: 05/27/16 Next Visit: 10/15/16  Okay to refill Klonopin?

## 2016-09-06 ENCOUNTER — Encounter (HOSPITAL_BASED_OUTPATIENT_CLINIC_OR_DEPARTMENT_OTHER): Payer: Self-pay | Admitting: Emergency Medicine

## 2016-09-06 ENCOUNTER — Emergency Department (HOSPITAL_BASED_OUTPATIENT_CLINIC_OR_DEPARTMENT_OTHER)
Admission: EM | Admit: 2016-09-06 | Discharge: 2016-09-06 | Disposition: A | Discharge status: Home / Self Care | Payer: BLUE CROSS/BLUE SHIELD | Attending: Emergency Medicine | Admitting: Emergency Medicine

## 2016-09-06 DIAGNOSIS — Z853 Personal history of malignant neoplasm of breast: Secondary | ICD-10-CM | POA: Insufficient documentation

## 2016-09-06 DIAGNOSIS — Z79899 Other long term (current) drug therapy: Secondary | ICD-10-CM | POA: Diagnosis not present

## 2016-09-06 DIAGNOSIS — Z7982 Long term (current) use of aspirin: Secondary | ICD-10-CM | POA: Diagnosis not present

## 2016-09-06 DIAGNOSIS — W57XXXA Bitten or stung by nonvenomous insect and other nonvenomous arthropods, initial encounter: Secondary | ICD-10-CM | POA: Insufficient documentation

## 2016-09-06 DIAGNOSIS — Y999 Unspecified external cause status: Secondary | ICD-10-CM | POA: Insufficient documentation

## 2016-09-06 DIAGNOSIS — I10 Essential (primary) hypertension: Secondary | ICD-10-CM | POA: Insufficient documentation

## 2016-09-06 DIAGNOSIS — Y939 Activity, unspecified: Secondary | ICD-10-CM | POA: Insufficient documentation

## 2016-09-06 DIAGNOSIS — Y929 Unspecified place or not applicable: Secondary | ICD-10-CM | POA: Insufficient documentation

## 2016-09-06 DIAGNOSIS — S50861A Insect bite (nonvenomous) of right forearm, initial encounter: Secondary | ICD-10-CM | POA: Diagnosis present

## 2016-09-06 MED ORDER — TRIAMCINOLONE ACETONIDE 0.5 % EX OINT: 1.0000 "application " | TOPICAL_OINTMENT | Freq: Two times a day (BID) | CUTANEOUS | 0 refills | Status: DC

## 2016-09-06 NOTE — ED Triage Notes (Signed)
?   Insect bite to RFA x 3 days, pain to area, red and circular
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
## 2016-09-28 ENCOUNTER — Other Ambulatory Visit: Payer: Self-pay | Admitting: Rheumatology

## 2016-09-30 NOTE — Telephone Encounter (Signed)
[place] due now

## 2016-09-30 NOTE — Telephone Encounter (Signed)
Last Visit: 05/27/16 Next Visit: 10/15/16 Labs: 04/17/16 WNL  Okay to refill Celebrex?

## 2016-10-09 DIAGNOSIS — M797 Fibromyalgia: Secondary | ICD-10-CM | POA: Insufficient documentation

## 2016-10-09 DIAGNOSIS — M19041 Primary osteoarthritis, right hand: Secondary | ICD-10-CM | POA: Insufficient documentation

## 2016-10-09 DIAGNOSIS — R5383 Other fatigue: Secondary | ICD-10-CM | POA: Insufficient documentation

## 2016-10-09 DIAGNOSIS — I10 Essential (primary) hypertension: Secondary | ICD-10-CM | POA: Insufficient documentation

## 2016-10-09 DIAGNOSIS — M7061 Trochanteric bursitis, right hip: Secondary | ICD-10-CM | POA: Insufficient documentation

## 2016-10-09 DIAGNOSIS — N189 Chronic kidney disease, unspecified: Secondary | ICD-10-CM | POA: Insufficient documentation

## 2016-10-09 DIAGNOSIS — M19042 Primary osteoarthritis, left hand: Secondary | ICD-10-CM

## 2016-10-09 DIAGNOSIS — C50919 Malignant neoplasm of unspecified site of unspecified female breast: Secondary | ICD-10-CM | POA: Insufficient documentation

## 2016-10-09 DIAGNOSIS — K219 Gastro-esophageal reflux disease without esophagitis: Secondary | ICD-10-CM | POA: Insufficient documentation

## 2016-10-09 DIAGNOSIS — Z853 Personal history of malignant neoplasm of breast: Secondary | ICD-10-CM | POA: Insufficient documentation

## 2016-10-09 DIAGNOSIS — G2581 Restless legs syndrome: Secondary | ICD-10-CM | POA: Insufficient documentation

## 2016-10-09 DIAGNOSIS — A6000 Herpesviral infection of urogenital system, unspecified: Secondary | ICD-10-CM | POA: Insufficient documentation

## 2016-10-09 DIAGNOSIS — M67911 Unspecified disorder of synovium and tendon, right shoulder: Secondary | ICD-10-CM | POA: Insufficient documentation

## 2016-10-09 DIAGNOSIS — F5101 Primary insomnia: Secondary | ICD-10-CM | POA: Insufficient documentation

## 2016-10-09 NOTE — Progress Notes (Signed)
Office Visit Note  Patient: Whitney Ward             Date of Birth: 09-27-1950           MRN: [id number]             PCP: Rocky Morel, MD Referring: Huey Romans* Visit Date: 10/15/2016 Occupation: @GUAROCC @    Subjective:  Follow-up   History of Present Illness: Whitney Ward is a 66 y.o. female  Complaining of right shoulder joint pain for the last 3 months. Last visit was 05/27/2016 and on that visit she also was having right shoulder joint bursitis and Dr. Estanislado Pandy injected it with 40 mg of Kenalog mixed with 1 mL 1% lidocaine. Patient did well with the injection until 3 months ago when it started hurting again.  Patient complains of having a hard time raising her hand to wash her hair and other activities that require her better range of motion of the right shoulder joint. The left shoulder joint is doing very well.  She also received injection in the right greater trochanter bursa.  Requesting cortisone to this site also.  Also, has a hx of bullous impetigo That had affected  Bilateral ankles and arms. tx'd w/ doxycycline and steroid injections and pills. Saw dermatologist dr. Daine Floras in [place]    Activities of Daily Living:  Patient reports morning stiffness for 30 minutes.   Patient Reports nocturnal pain.  Difficulty dressing/grooming: Reports Difficulty climbing stairs: Reports Difficulty getting out of chair: Reports Difficulty using hands for taps, buttons, cutlery, and/or writing: Reports   Review of Systems  Constitutional: Positive for fatigue.  HENT: Negative for mouth sores and mouth dryness.   Eyes: Negative for dryness.  Respiratory: Negative for shortness of breath.   Gastrointestinal: Negative for constipation and diarrhea.  Musculoskeletal: Positive for myalgias and myalgias.  Skin: Negative for sensitivity to sunlight.  Psychiatric/Behavioral: Positive for sleep disturbance. Negative for decreased  concentration.    PMFS History:  Patient Active Problem List   Diagnosis Date Noted  . Fibromyalgia 10/09/2016  . Primary insomnia 10/09/2016  . Other fatigue 10/09/2016  . Tendinopathy of right shoulder 10/09/2016  . Trochanteric bursitis of right hip 10/09/2016  . Primary osteoarthritis of both hands 10/09/2016  . Chronic kidney disease 10/09/2016  . Essential hypertension 10/09/2016  . Gastroesophageal reflux disease 10/09/2016  . Restless leg syndrome 10/09/2016  . Malignant neoplasm of female breast ([place]) 10/09/2016  . History of breast cancer 10/09/2016  . Genital herpes simplex 10/09/2016    Past Medical History:  Diagnosis Date  . Arthritis   . Breast cancer ([place])   . Breast cancer ([place])   . Fibromyalgia   . Hypertension     No family history on file. Past Surgical History:  Procedure Laterality Date  . ABDOMINAL HYSTERECTOMY    . BREAST SURGERY    . CESAREAN SECTION     Social History   Social History Narrative  . No narrative on file     Objective: Vital Signs: BP 124/70   Pulse 82   Resp 14   Ht 6' (1.829 m)   Wt 238 lb (108 kg)   BMI 32.28 kg/m    Physical Exam  Constitutional: She is oriented to person, place, and time. She appears well-developed and well-nourished.  HENT:  Head: Normocephalic and atraumatic.  Eyes: EOM are normal. Pupils are equal, round, and reactive to light.  Cardiovascular: Normal rate, regular rhythm and normal heart  sounds.  Exam reveals no gallop and no friction rub.   No murmur heard. Pulmonary/Chest: Effort normal and breath sounds normal. She has no wheezes. She has no rales.  Abdominal: Soft. Bowel sounds are normal. She exhibits no distension. There is no tenderness. There is no guarding. No hernia.  Musculoskeletal: Normal range of motion. She exhibits no edema, tenderness or deformity.  Lymphadenopathy:    She has no cervical adenopathy.  Neurological: She is alert and oriented to person, place, and time.  Coordination normal.  Skin: Skin is warm and dry. Capillary refill takes less than 2 seconds. No rash noted.  Psychiatric: She has a normal mood and affect. Her behavior is normal.  Nursing note and vitals reviewed.    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  Latest known visit with results is:  Admission on 04/09/2016, Discharged on 04/09/2016  Component Date Value Ref Range Status  . Color, Urine 04/09/2016 YELLOW  YELLOW Final  . APPearance 04/09/2016 CLEAR  CLEAR Final  . Specific Gravity, Urine 04/09/2016 1.020  1.005 - 1.030 Final  . pH 04/09/2016 6.5  5.0 - 8.0 Final  . Glucose, UA 04/09/2016 NEGATIVE  NEGATIVE mg/dL Final  . Hgb urine dipstick 04/09/2016 NEGATIVE  NEGATIVE Final  . Bilirubin Urine 04/09/2016 NEGATIVE  NEGATIVE Final  . Ketones, ur 04/09/2016 NEGATIVE  NEGATIVE mg/dL Final  . Protein, ur 04/09/2016 NEGATIVE  NEGATIVE mg/dL Final  . Nitrite 04/09/2016 NEGATIVE  NEGATIVE Final  . Leukocytes, UA 04/09/2016 SMALL* NEGATIVE Final  . Squamous Epithelial / LPF 04/09/2016 0-5* NONE SEEN Final  . WBC, UA 04/09/2016 6-30  0 - 5 WBC/hpf Final  . RBC / HPF 04/09/2016 NONE SEEN  0 - 5 RBC/hpf Final  . Bacteria, UA 04/09/2016 RARE* NONE SEEN Final  . Specimen Description 04/09/2016 URINE, RANDOM   Final  . Special Requests 04/09/2016 NONE   Final  . Culture 04/09/2016 MULTIPLE SPECIES PRESENT, SUGGEST RECOLLECTION*  Final  . Report Status 04/09/2016 04/11/2016 FINAL   Final      Imaging: No results found.  Speciality Comments: No specialty comments  available.    Procedures:  Large Joint Inj Date/Time: 10/15/2016 9:10 AM Performed by: Eliezer Lofts Authorized by: Eliezer Lofts   Consent Given by:  Patient Site marked: the procedure site was marked   Timeout: prior to procedure the correct patient, procedure, and site was verified   Indications:  Pain Location:  Sacroiliac Site:  R sacroiliac joint Prep: patient was prepped and draped in usual sterile fashion   Needle Size:  27 G Needle Length:  1.5 inches Approach:  Superior Ultrasound Guidance: No   Fluoroscopic Guidance: No   Arthrogram: No   Medications:  1 mL lidocaine 1 %; 40 mg triamcinolone acetonide 40 MG/ML Aspiration Attempted: Yes   Aspirate amount (mL):  0 Patient tolerance:  Patient tolerated the procedure well with no immediate complications  Right SI joint was injected Large Joint Inj Date/Time: 10/15/2016 9:10 AM Performed by: Eliezer Lofts Authorized by: Eliezer Lofts   Consent Given by:  Patient Site marked: the  procedure site was marked   Timeout: prior to procedure the correct patient, procedure, and site was verified   Indications:  Pain Location:  Shoulder Site:  R glenohumeral Prep: patient was prepped and draped in usual sterile fashion   Needle Size:  27 G Needle Length:  1.5 inches Approach:  Posterior Ultrasound Guidance: No   Fluoroscopic Guidance: No   Arthrogram: No   Medications:  1 mL lidocaine 1 %; 40 mg triamcinolone acetonide 40 MG/ML Aspiration Attempted: Yes   Aspirate amount (mL):  0 Patient tolerance:  Patient tolerated the procedure well with no immediate complications  Posterior approach for the right shoulder joint bursitis. Patient rated her pain is about 7 on a scale of 0-10 prior to the injection. A minute after the injection she rated her pain as 4.   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 Flaring. Recently has been having bullous impetigo which was very painful and it took a long time to treat. Patient saw dermatologist in [facility] and received medication that included doxycycline as well as cortisone injection as well as prednisone taper.  #2: Fatigue and insomnia.  #3: Right shoulder joint bursitis. See procedure note for full details  #4: Right greater trochanter bursitis. See procedure note for full details  #5: Return to clinic in 6 months  #6: Sample of [place] given Expiration date November 2018 Lot number [id number]  Orders: No orders of the defined types were placed in this encounter.  No orders of the defined types were placed in this encounter.   Face-to-face time spent with patient was 30 minutes. 50% of time was spent in counseling and coordination of care.  Follow-Up Instructions: No Follow-up on file.   Eliezer Lofts, PA-C  Note - This record has been created using [employer].  Chart creation errors have been sought, but may not always  have been located. Such creation errors do not reflect on  the standard of medical care.

## 2016-10-15 ENCOUNTER — Encounter: Payer: Self-pay | Admitting: Rheumatology

## 2016-10-15 ENCOUNTER — Ambulatory Visit (INDEPENDENT_AMBULATORY_CARE_PROVIDER_SITE_OTHER): Payer: BLUE CROSS/BLUE SHIELD | Admitting: Rheumatology

## 2016-10-15 VITALS — BP 124/70 | HR 82 | Resp 14 | Ht 72.0 in | Wt 238.0 lb

## 2016-10-15 DIAGNOSIS — F5101 Primary insomnia: Secondary | ICD-10-CM | POA: Diagnosis not present

## 2016-10-15 DIAGNOSIS — M67911 Unspecified disorder of synovium and tendon, right shoulder: Secondary | ICD-10-CM

## 2016-10-15 DIAGNOSIS — M797 Fibromyalgia: Secondary | ICD-10-CM

## 2016-10-15 DIAGNOSIS — M1611 Unilateral primary osteoarthritis, right hip: Secondary | ICD-10-CM

## 2016-10-15 DIAGNOSIS — K219 Gastro-esophageal reflux disease without esophagitis: Secondary | ICD-10-CM

## 2016-10-15 DIAGNOSIS — Z853 Personal history of malignant neoplasm of breast: Secondary | ICD-10-CM

## 2016-10-15 DIAGNOSIS — G2581 Restless legs syndrome: Secondary | ICD-10-CM

## 2016-10-15 DIAGNOSIS — M19041 Primary osteoarthritis, right hand: Secondary | ICD-10-CM

## 2016-10-15 DIAGNOSIS — R5383 Other fatigue: Secondary | ICD-10-CM | POA: Diagnosis not present

## 2016-10-15 DIAGNOSIS — I10 Essential (primary) hypertension: Secondary | ICD-10-CM

## 2016-10-15 DIAGNOSIS — M19042 Primary osteoarthritis, left hand: Secondary | ICD-10-CM

## 2016-10-15 DIAGNOSIS — M7551 Bursitis of right shoulder: Secondary | ICD-10-CM

## 2016-10-15 DIAGNOSIS — M7061 Trochanteric bursitis, right hip: Secondary | ICD-10-CM

## 2016-10-15 DIAGNOSIS — N189 Chronic kidney disease, unspecified: Secondary | ICD-10-CM

## 2016-10-15 DIAGNOSIS — A6000 Herpesviral infection of urogenital system, unspecified: Secondary | ICD-10-CM

## 2016-10-15 LAB — CBC WITH DIFFERENTIAL/PLATELET
BASOS PCT: 0 %
Basophils Absolute: 0 cells/uL (ref 0–200)
EOS PCT: 5 %
Eosinophils Absolute: 175 cells/uL (ref 15–500)
HCT: 37.4 % (ref 35.0–45.0)
Hemoglobin: 12.4 g/dL (ref 11.7–15.5)
LYMPHS PCT: 34 %
Lymphs Abs: 1190 cells/uL (ref 850–3900)
MCH: 30.5 pg (ref 27.0–33.0)
MCHC: 33.2 g/dL (ref 32.0–36.0)
MCV: 91.9 fL (ref 80.0–100.0)
MONOS PCT: 10 %
MPV: 10.2 fL (ref 7.5–12.5)
Monocytes Absolute: 350 cells/uL (ref 200–950)
NEUTROS ABS: 1785 {cells}/uL (ref 1500–7800)
Neutrophils Relative %: 51 %
PLATELETS: 275 10*3/uL (ref 140–400)
RBC: 4.07 MIL/uL (ref 3.80–5.10)
RDW: 14.1 % (ref 11.0–15.0)
WBC: 3.5 10*3/uL — AB (ref 3.8–10.8)

## 2016-10-15 LAB — COMPLETE METABOLIC PANEL WITH GFR
ALT: 21 U/L (ref 6–29)
AST: 26 U/L (ref 10–35)
Albumin: 4.4 g/dL (ref 3.6–5.1)
Alkaline Phosphatase: 99 U/L (ref 33–130)
BUN: 16 mg/dL (ref 7–25)
CHLORIDE: 102 mmol/L (ref 98–110)
CO2: 28 mmol/L (ref 20–31)
Calcium: 9.6 mg/dL (ref 8.6–10.4)
Creat: 1.05 mg/dL — ABNORMAL HIGH (ref 0.50–0.99)
GFR, EST AFRICAN AMERICAN: 64 mL/min (ref >=60)
GFR, Est Non African American: 55 mL/min — ABNORMAL LOW (ref >=60)
GLUCOSE: 153 mg/dL — AB (ref 65–99)
Potassium: 3.6 mmol/L (ref 3.5–5.3)
SODIUM: 141 mmol/L (ref 135–146)
Total Bilirubin: 0.5 mg/dL (ref 0.2–1.2)
Total Protein: 7.1 g/dL (ref 6.1–8.1)

## 2016-10-15 MED ORDER — TRIAMCINOLONE ACETONIDE 40 MG/ML IJ SUSP
40.0000 mg | INTRAMUSCULAR | Status: AC | PRN
  Administered 2016-10-15: 40 mg via INTRA_ARTICULAR

## 2016-10-15 MED ORDER — LIDOCAINE HCL 1 % IJ SOLN
1.0000 mL | INTRAMUSCULAR | Status: AC | PRN
  Administered 2016-10-15: 1 mL

## 2016-10-17 ENCOUNTER — Telehealth: Payer: Self-pay | Admitting: Radiology

## 2016-10-17 NOTE — Telephone Encounter (Signed)
I have called patient to advise of  labs encouraged good water intake and we will monitor.

## 2016-10-17 NOTE — Telephone Encounter (Signed)
-----   Message from Eliezer Lofts, [place] sent at 10/16/2016  5:10 PM EDT ----- Send copy of labs to PCP And tell patient #1: CBC with differential is normal except white count is slightly low at 3.5. All else is normal.  #2: CMP with GFR shows mild increasing creatinine 1.05 (we can monitor). In the meanwhile, advised patient to minimize meds like and states that can affect the kidneys; drink adequate amount of water. Nonfasting glucose is elevated at 153

## 2017-01-19 ENCOUNTER — Encounter: Payer: Self-pay | Admitting: Rheumatology

## 2017-01-19 ENCOUNTER — Ambulatory Visit (INDEPENDENT_AMBULATORY_CARE_PROVIDER_SITE_OTHER): Payer: BLUE CROSS/BLUE SHIELD | Admitting: Rheumatology

## 2017-01-19 VITALS — BP 116/68 | HR 76 | Ht 71.5 in | Wt 232.0 lb

## 2017-01-19 DIAGNOSIS — M461 Sacroiliitis, not elsewhere classified: Secondary | ICD-10-CM | POA: Diagnosis not present

## 2017-01-19 DIAGNOSIS — R5383 Other fatigue: Secondary | ICD-10-CM | POA: Diagnosis not present

## 2017-01-19 DIAGNOSIS — F5101 Primary insomnia: Secondary | ICD-10-CM | POA: Diagnosis not present

## 2017-01-19 DIAGNOSIS — M797 Fibromyalgia: Secondary | ICD-10-CM | POA: Diagnosis not present

## 2017-01-19 DIAGNOSIS — M7061 Trochanteric bursitis, right hip: Secondary | ICD-10-CM

## 2017-01-19 MED ORDER — TRIAMCINOLONE ACETONIDE 40 MG/ML IJ SUSP
40.0000 mg | INTRAMUSCULAR | Status: AC | PRN
  Administered 2017-01-19: 40 mg via INTRA_ARTICULAR

## 2017-01-19 MED ORDER — LIDOCAINE HCL 1 % IJ SOLN
1.0000 mL | INTRAMUSCULAR | Status: AC | PRN
  Administered 2017-01-19: 1 mL

## 2017-01-19 MED ORDER — DICLOFENAC SODIUM 1 % TD GEL: TRANSDERMAL | 12 refills | Status: DC

## 2017-01-19 MED ORDER — CLONAZEPAM 1 MG PO TABS: 1.0000 mg | ORAL_TABLET | Freq: Every evening | ORAL | 5 refills | Status: DC | PRN

## 2017-01-19 MED ORDER — LIDOCAINE HCL 2 % IJ SOLN
2.0000 mL | INTRAMUSCULAR | Status: AC | PRN
  Administered 2017-01-19: 2 mL

## 2017-01-19 NOTE — Progress Notes (Signed)
Office Visit Note  Patient: Whitney Ward             Date of Birth: 04/27/1951           MRN: [id number]             PCP: Whitney Morel, MD Referring: Whitney Ward* Visit Date: 01/19/2017 Occupation: _0 @    Subjective:  Hip Pain (right hip pain. several months severe pain. no injury.  most pain with walking stairs, getting up and down from chair and laying down.  ibuprofen takes 4 - 222m to take edge off pain.)  History of Present Illness: Whitney FELDTis a 66y.o. female  Who was last seen on 10/15/2016 for fibromyalgia and right shoulder injection and right greater trochanteric bursa injection. She was scheduled to come back in 03/17/2017 for follow-up on these issues.  She comes in today complaining of right greater trochant and right si jt; rates pain as 9 on 0-10 scale; hurting for last 3-4 weeks. Injection to these sites in April helped for 2 months (was able to walking, exercise, go up and down stairs).    Activities of Daily Living:  Patient reports morning stiffness for 15 minutes.   Patient Reports nocturnal pain.  Difficulty dressing/grooming: Denies Difficulty climbing stairs: Reports Difficulty getting out of chair: Reports Difficulty using hands for taps, buttons, cutlery, and/or writing: Denies   Review of Systems  Constitutional: Positive for fatigue.  HENT: Negative for mouth sores and mouth dryness.   Eyes: Negative for dryness.  Respiratory: Negative for shortness of breath.   Gastrointestinal: Negative for constipation and diarrhea.  Musculoskeletal: Positive for myalgias and myalgias.  Skin: Negative for sensitivity to sunlight.  Psychiatric/Behavioral: Positive for sleep disturbance. Negative for decreased concentration.    PMFS History:  Patient Active Problem List   Diagnosis Date Noted  . Fibromyalgia 10/09/2016  . Primary insomnia 10/09/2016  . Other fatigue 10/09/2016  . Tendinopathy of right  shoulder 10/09/2016  . Trochanteric bursitis of right hip 10/09/2016  . Primary osteoarthritis of both hands 10/09/2016  . Chronic kidney disease 10/09/2016  . Essential hypertension 10/09/2016  . Gastroesophageal reflux disease 10/09/2016  . Restless leg syndrome 10/09/2016  . Malignant neoplasm of female breast ([place] 10/09/2016  . History of breast cancer 10/09/2016  . Genital herpes simplex 10/09/2016    Past Medical History:  Diagnosis Date  . Arthritis   . [place]   . [place]   . Fibromyalgia   . Hypertension     No family history on file. Past Surgical History:  Procedure Laterality Date  . ABDOMINAL HYSTERECTOMY    . BREAST SURGERY    . CESAREAN SECTION     Social History   Social History Narrative  . No narrative on file     Objective: Vital Signs: BP 116/68 (BP Location: Right Arm, Patient Position: Sitting, Cuff Size: Normal)   Pulse 76   Ht 5' 11.5" (1.816 m)   Wt 232 lb (105.2 kg)   BMI 31.91 kg/m    Physical Exam  Constitutional: She is oriented to person, place, and time. She appears well-developed and well-nourished.  HENT:  Head: Normocephalic and atraumatic.  Eyes: Pupils are equal, round, and reactive to light. EOM are normal.  Cardiovascular: Normal rate, regular rhythm and normal heart sounds.  Exam reveals no gallop and no friction rub.   No murmur heard. Pulmonary/Chest: Effort normal and breath sounds normal. She has no wheezes.  She has no rales.  Abdominal: Soft. Bowel sounds are normal. She exhibits no distension. There is no tenderness. There is no guarding. No hernia.  Musculoskeletal: Normal range of motion. She exhibits no edema, tenderness or deformity.  Lymphadenopathy:    She has no cervical adenopathy.  Neurological: She is alert and oriented to person, place, and time. Coordination normal.  Skin: Skin is warm and dry. Capillary refill takes less than 2 seconds. No rash noted.  Psychiatric: She has a  normal mood and affect. Her behavior is normal.  Nursing note and vitals reviewed.    Musculoskeletal Exam:  Full range of motion of all joints Grip strength is equal and strong bilaterally Fiber myalgia tender points are absent  CDAI Exam: CDAI Homunculus Exam:   Joint Counts:  CDAI Tender Joint count: 0 CDAI Swollen Joint count: 0  Global Assessments:  Patient Global Assessment: 10 Provider Global Assessment: 10  CDAI Calculated Score: 20    Investigation: No additional findings. Office Visit on 10/15/2016  Component Date Value Ref Range Status  . WBC 10/15/2016 3.5* 3.8 - 10.8 K/uL Final  . RBC 10/15/2016 4.07  3.80 - 5.10 MIL/uL Final  . Hemoglobin 10/15/2016 12.4  11.7 - 15.5 g/dL Final  . HCT 10/15/2016 37.4  35.0 - 45.0 % Final  . MCV 10/15/2016 91.9  80.0 - 100.0 fL Final  . MCH 10/15/2016 30.5  27.0 - 33.0 pg Final  . MCHC 10/15/2016 33.2  32.0 - 36.0 g/dL Final  . RDW 10/15/2016 14.1  11.0 - 15.0 % Final  . Platelets 10/15/2016 275  140 - 400 K/uL Final  . MPV 10/15/2016 10.2  7.5 - 12.5 fL Final  . Neutro Abs 10/15/2016 1785  1,500 - 7,800 cells/uL Final  . Lymphs Abs 10/15/2016 1190  850 - 3,900 cells/uL Final  . Monocytes Absolute 10/15/2016 350  200 - 950 cells/uL Final  . Eosinophils Absolute 10/15/2016 175  15 - 500 cells/uL Final  . Basophils Absolute 10/15/2016 0  0 - 200 cells/uL Final  . Neutrophils Relative % 10/15/2016 51  % Final  . Lymphocytes Relative 10/15/2016 34  % Final  . Monocytes Relative 10/15/2016 10  % Final  . Eosinophils Relative 10/15/2016 5  % Final  . Basophils Relative 10/15/2016 0  % Final  . Smear Review 10/15/2016 Criteria for review not met   Final  . Sodium 10/15/2016 141  135 - 146 mmol/L Final  . Potassium 10/15/2016 3.6  3.5 - 5.3 mmol/L Final  . Chloride 10/15/2016 102  98 - 110 mmol/L Final  . CO2 10/15/2016 28  20 - 31 mmol/L Final  . Glucose, Bld 10/15/2016 153* 65 - 99 mg/dL Final  . BUN 10/15/2016 16  7 - 25  mg/dL Final  . Creat 10/15/2016 1.05* 0.50 - 0.99 mg/dL Final   Comment:   For patients > or = 67 years of age: The upper reference limit for Creatinine is approximately 13% higher for people identified as African-American.     . Total Bilirubin 10/15/2016 0.5  0.2 - 1.2 mg/dL Final  . Alkaline Phosphatase 10/15/2016 99  33 - 130 U/L Final  . AST 10/15/2016 26  10 - 35 U/L Final  . ALT 10/15/2016 21  6 - 29 U/L Final  . Total Protein 10/15/2016 7.1  6.1 - 8.1 g/dL Final  . Albumin 10/15/2016 4.4  3.6 - 5.1 g/dL Final  . Calcium 10/15/2016 9.6  8.6 - 10.4 mg/dL Final  .  GFR, Est African American 10/15/2016 64  >=60 mL/min Final  . GFR, Est Non African American 10/15/2016 55* >=60 mL/min Final     Imaging: No results found.  Speciality Comments: No specialty comments available.   Procedures:  Large Joint Inj Date/Time: 01/19/2017 2:27 PM Performed by: Eliezer Lofts Authorized by: Eliezer Lofts   Consent Given by:  Patient Site marked: the procedure site was marked   Timeout: prior to procedure the correct patient, procedure, and site was verified   Indications:  Pain Location:  Hip Prep: patient was prepped and draped in usual sterile fashion   Needle Size:  27 G Approach:  Superior Ultrasound Guidance: No   Fluoroscopic Guidance: No   Arthrogram: No   Medications:  40 mg triamcinolone acetonide 40 MG/ML; 2 mL lidocaine 2 % Aspiration Attempted: Yes   Aspirate amount (mL):  0 Patient tolerance:  Patient tolerated the procedure well with no immediate complications Large Joint Inj Date/Time: 01/19/2017 2:31 PM Performed by: Eliezer Lofts Authorized by: Eliezer Lofts   Consent Given by:  Patient Site marked: the procedure site was marked   Timeout: prior to procedure the correct patient, procedure, and site was verified   Indications:  Pain Location:  Hip Site:  R hip joint Prep: patient was prepped and draped in usual sterile fashion   Needle Size:  27  G Needle Length:  1.5 inches Approach:  Superior Ultrasound Guidance: No   Fluoroscopic Guidance: No   Arthrogram: No   Medications:  1 mL lidocaine 1 %; 40 mg triamcinolone acetonide 40 MG/ML Aspiration Attempted: Yes   Aspirate amount (mL):  0  PATIENT TOLERATED PROCEDURE WELL. THERE WERE NO COMPLICATIONS.     Allergies: Codeine; Penicillins; Sulfa antibiotics; Tramadol; Aromasin [exemestane]; Atorvastatin; Celexa [citalopram hydrobromide]; and Citalopram   Assessment / Plan:     Visit Diagnoses: Fibromyalgia  Primary insomnia  Other fatigue  Trochanteric bursitis of right hip  Sacroiliitis, not elsewhere classified ([place])    The last injection helped for a long time until recently noted a long time Orders: Orders Placed This Encounter  Procedures  . Large Joint Injection/Arthrocentesis  . Large Joint Injection/Arthrocentesis   Meds ordered this encounter  Medications  . DISCONTD: diclofenac sodium (VOLTAREN) 1 % GEL    Sig: Voltaren Gel 3 grams to 3 large joints upto TID 3 TUBES with 3 refills    Dispense:  1 Tube    Refill:  12    Voltaren Gel 3 grams to 3 large joints upto TID 3 TUBES with 3 refills    Order Specific Question:   Supervising Provider    Answer:   Bo Merino [[id number]]  . DISCONTD: clonazePAM (KLONOPIN) 1 MG tablet    Sig: Take 1 tablet (1 mg total) by mouth at bedtime as needed.    Dispense:  30 tablet    Refill:  5    Order Specific Question:   Supervising Provider    Answer:   Bo Merino [[id number]]  . DISCONTD: diclofenac sodium (VOLTAREN) 1 % GEL    Sig: Voltaren Gel 3 grams to 3 large joints upto TID 3 TUBES with 3 refills    Dispense:  1 Tube    Refill:  12    Voltaren Gel 3 grams to 3 large joints upto TID 3 TUBES with 3 refills    Order Specific Question:   Supervising Provider    Answer:   Bo Merino [[id number]]  . clonazePAM (KLONOPIN) 1 MG tablet  Sig: Take 1 tablet (1 mg total) by mouth at bedtime as needed.     Dispense:  30 tablet    Refill:  5  . DISCONTD: diclofenac sodium (VOLTAREN) 1 % GEL    Sig: Voltaren Gel 3 grams to 3 large joints upto TID 3 TUBES with 3 refills    Dispense:  1 Tube    Refill:  12    Voltaren Gel 3 grams to 3 large joints upto TID 3 TUBES with 3 refills  . diclofenac sodium (VOLTAREN) 1 % GEL    Sig: Voltaren Gel 3 grams to 3 large joints upto TID 3 TUBES with 3 refills    Dispense:  1 Tube    Refill:  12    Voltaren Gel 3 grams to 3 large joints upto TID 3 TUBES with 3 refills    Order Specific Question:   Supervising Provider    Answer:   Lyda Perone    Face-to-face time spent with patient was 30 minutes. 50% of time was spent in counseling and coordination of care.  Follow-Up Instructions: Return in about 5 months (around 06/21/2017).   Eliezer Lofts, PA-C  Note - This record has been created using [employer].  Chart creation errors have been sought, but may not always  have been located. Such creation errors do not reflect on  the standard of medical care.

## 2017-01-22 ENCOUNTER — Ambulatory Visit: Payer: BLUE CROSS/BLUE SHIELD | Admitting: Rheumatology

## 2017-02-18 ENCOUNTER — Ambulatory Visit (INDEPENDENT_AMBULATORY_CARE_PROVIDER_SITE_OTHER): Payer: BLUE CROSS/BLUE SHIELD | Admitting: Rheumatology

## 2017-02-18 ENCOUNTER — Ambulatory Visit (INDEPENDENT_AMBULATORY_CARE_PROVIDER_SITE_OTHER): Payer: BLUE CROSS/BLUE SHIELD

## 2017-02-18 ENCOUNTER — Ambulatory Visit (INDEPENDENT_AMBULATORY_CARE_PROVIDER_SITE_OTHER): Payer: Self-pay

## 2017-02-18 ENCOUNTER — Encounter: Payer: Self-pay | Admitting: Rheumatology

## 2017-02-18 VITALS — BP 128/72 | HR 78 | Resp 14 | Ht 72.0 in | Wt 223.0 lb

## 2017-02-18 DIAGNOSIS — M533 Sacrococcygeal disorders, not elsewhere classified: Secondary | ICD-10-CM | POA: Diagnosis not present

## 2017-02-18 DIAGNOSIS — N189 Chronic kidney disease, unspecified: Secondary | ICD-10-CM | POA: Diagnosis not present

## 2017-02-18 DIAGNOSIS — M7061 Trochanteric bursitis, right hip: Secondary | ICD-10-CM | POA: Diagnosis not present

## 2017-02-18 DIAGNOSIS — M25551 Pain in right hip: Secondary | ICD-10-CM | POA: Diagnosis not present

## 2017-02-18 DIAGNOSIS — M797 Fibromyalgia: Secondary | ICD-10-CM | POA: Diagnosis not present

## 2017-02-18 MED ORDER — BACLOFEN 10 MG PO TABS: 10.0000 mg | ORAL_TABLET | Freq: Three times a day (TID) | ORAL | 1 refills | Status: DC

## 2017-02-18 MED ORDER — LIDOCAINE HCL 1 % IJ SOLN
2.0000 mL | INTRAMUSCULAR | Status: AC | PRN
  Administered 2017-02-18: 2 mL

## 2017-02-18 MED ORDER — IBUPROFEN 800 MG PO TABS: 800.0000 mg | ORAL_TABLET | Freq: Three times a day (TID) | ORAL | 0 refills | Status: AC | PRN

## 2017-02-18 MED ORDER — TRIAMCINOLONE ACETONIDE 40 MG/ML IJ SUSP
40.0000 mg | INTRAMUSCULAR | Status: AC | PRN
  Administered 2017-02-18: 40 mg via INTRA_ARTICULAR

## 2017-02-18 NOTE — Progress Notes (Addendum)
Office Visit Note  Patient: Whitney Ward             Date of Birth: 10/30/1950           MRN: [id number]             PCP: Rocky Morel, MD Referring: Huey Romans* Visit Date: 02/18/2017 Occupation: _0 @    Subjective:  Pain of the Right Hip (Injection only helped for a week )   History of Present Illness: Whitney Ward is a 66 y.o. female  Who was last seen in our office in July 2018 for right SI joint injection and right greater trochanter bursa injection. (Note: The left greater trochanter bursa was not injected).  Patient states that the injections given the right SI joint and the right greater trochanteric bursa helped her for approximately 3 weeks. As of about 7-10 days ago, patient began having pain once again. She states "one day was there in the next day I had excruciating pain". She is using 800 mg of ibuprofen twice a day with good relief. She finds it difficult to go up and down the stairs, get up and down from a chair, as well as activities of daily living.  Patient has a history of breast cancer and is concerned why her pain continues. She reports that she had similar pain in June and she came to [place] in July for the injection. Now she has sustained pain once again and wants to rule out any other reasons why she is having this pain. She rates her discomfort as a 7-8 on a scale of 0-10. She denies any urinary symptoms, fever, falls or injuries. She's had a hysterectomy but still has her ovaries intact.     Activities of Daily Living:  Patient reports morning stiffness for 30 minutes.   Patient Reports nocturnal pain.  Difficulty dressing/grooming: Reports Difficulty climbing stairs: Reports Difficulty getting out of chair: Reports Difficulty using hands for taps, buttons, cutlery, and/or writing: Denies   Review of Systems  Constitutional: Positive for fatigue.  HENT: Negative for mouth sores and mouth dryness.   Eyes:  Negative for dryness.  Respiratory: Negative for shortness of breath.   Gastrointestinal: Negative for constipation and diarrhea.  Musculoskeletal: Positive for myalgias and myalgias.  Skin: Negative for sensitivity to sunlight.  Psychiatric/Behavioral: Positive for sleep disturbance. Negative for decreased concentration.    PMFS History:  Patient Active Problem List   Diagnosis Date Noted  . Fibromyalgia 10/09/2016  . Primary insomnia 10/09/2016  . Other fatigue 10/09/2016  . Tendinopathy of right shoulder 10/09/2016  . Trochanteric bursitis of right hip 10/09/2016  . Primary osteoarthritis of both hands 10/09/2016  . Chronic kidney disease 10/09/2016  . Essential hypertension 10/09/2016  . Gastroesophageal reflux disease 10/09/2016  . Restless leg syndrome 10/09/2016  . Malignant neoplasm of female breast ([place]) 10/09/2016  . History of breast cancer 10/09/2016  . Genital herpes simplex 10/09/2016    Past Medical History:  Diagnosis Date  . Arthritis   . Breast cancer ([place])   . Breast cancer ([place])   . Fibromyalgia   . Hypertension     No family history on file. Past Surgical History:  Procedure Laterality Date  . ABDOMINAL HYSTERECTOMY    . BREAST SURGERY    . CESAREAN SECTION     Social History   Social History Narrative  . No narrative on file     Objective: Vital Signs: BP 128/72   Pulse 78  Resp 14   Ht 6' (1.829 m)   Wt 223 lb (101.2 kg)   BMI 30.24 kg/m    Physical Exam  Constitutional: She is oriented to person, place, and time. She appears well-developed and well-nourished.  HENT:  Head: Normocephalic and atraumatic.  Eyes: Pupils are equal, round, and reactive to light. EOM are normal.  Cardiovascular: Normal rate, regular rhythm and normal heart sounds.  Exam reveals no gallop and no friction rub.   No murmur heard. Pulmonary/Chest: Effort normal and breath sounds normal. She has no wheezes. She has no rales.  Abdominal: Soft. Bowel sounds  are normal. She exhibits no distension. There is no tenderness. There is no guarding. No hernia.  Musculoskeletal: Normal range of motion. She exhibits no edema, tenderness or deformity.  Lymphadenopathy:    She has no cervical adenopathy.  Neurological: She is alert and oriented to person, place, and time. Coordination normal.  Skin: Skin is warm and dry. Capillary refill takes less than 2 seconds. No rash noted.  Psychiatric: She has a normal mood and affect. Her behavior is normal.  Nursing note and vitals reviewed.    Musculoskeletal Exam:  Full range of motion of all joints except decreased range of motion of internal and external rotation of the right hip. Patient walks cautiously secondary to right hip pain. She places her hand on her right hip to ease her pain. Grip strength is equal and strong bilaterally Fibromyalgia tender points are absent  CDAI Exam: CDAI Homunculus Exam:   Joint Counts:  CDAI Tender Joint count: 0 CDAI Swollen Joint count: 0  Global Assessments:  Patient Global Assessment: 7 Provider Global Assessment: 7  CDAI Calculated Score: 14  No synovitis on exam  Investigation: No additional findings. Office Visit on 10/15/2016  Component Date Value Ref Range Status  . WBC 10/15/2016 3.5* 3.8 - 10.8 K/uL Final  . RBC 10/15/2016 4.07  3.80 - 5.10 MIL/uL Final  . Hemoglobin 10/15/2016 12.4  11.7 - 15.5 g/dL Final  . HCT 10/15/2016 37.4  35.0 - 45.0 % Final  . MCV 10/15/2016 91.9  80.0 - 100.0 fL Final  . MCH 10/15/2016 30.5  27.0 - 33.0 pg Final  . MCHC 10/15/2016 33.2  32.0 - 36.0 g/dL Final  . RDW 10/15/2016 14.1  11.0 - 15.0 % Final  . Platelets 10/15/2016 275  140 - 400 K/uL Final  . MPV 10/15/2016 10.2  7.5 - 12.5 fL Final  . Neutro Abs 10/15/2016 1785  1,500 - 7,800 cells/uL Final  . Lymphs Abs 10/15/2016 1190  850 - 3,900 cells/uL Final  . Monocytes Absolute 10/15/2016 350  200 - 950 cells/uL Final  . Eosinophils Absolute 10/15/2016 175  15 -  500 cells/uL Final  . Basophils Absolute 10/15/2016 0  0 - 200 cells/uL Final  . Neutrophils Relative % 10/15/2016 51  % Final  . Lymphocytes Relative 10/15/2016 34  % Final  . Monocytes Relative 10/15/2016 10  % Final  . Eosinophils Relative 10/15/2016 5  % Final  . Basophils Relative 10/15/2016 0  % Final  . Smear Review 10/15/2016 Criteria for review not met   Final  . Sodium 10/15/2016 141  135 - 146 mmol/L Final  . Potassium 10/15/2016 3.6  3.5 - 5.3 mmol/L Final  . Chloride 10/15/2016 102  98 - 110 mmol/L Final  . CO2 10/15/2016 28  20 - 31 mmol/L Final  . Glucose, Bld 10/15/2016 153* 65 - 99 mg/dL Final  . BUN 10/15/2016  16  7 - 25 mg/dL Final  . Creat 10/15/2016 1.05* 0.50 - 0.99 mg/dL Final   Comment:   For patients > or = 67 years of age: The upper reference limit for Creatinine is approximately 13% higher for people identified as African-American.     . Total Bilirubin 10/15/2016 0.5  0.2 - 1.2 mg/dL Final  . Alkaline Phosphatase 10/15/2016 99  33 - 130 U/L Final  . AST 10/15/2016 26  10 - 35 U/L Final  . ALT 10/15/2016 21  6 - 29 U/L Final  . Total Protein 10/15/2016 7.1  6.1 - 8.1 g/dL Final  . Albumin 10/15/2016 4.4  3.6 - 5.1 g/dL Final  . Calcium 10/15/2016 9.6  8.6 - 10.4 mg/dL Final  . GFR, Est African American 10/15/2016 64  >=60 mL/min Final  . GFR, Est Non African American 10/15/2016 55* >=60 mL/min Final     Imaging: No results found.  Speciality Comments: No specialty comments available.    Procedures:  Large Joint Inj Date/Time: 02/18/2017 9:18 AM Performed by: Eliezer Lofts Authorized by: Eliezer Lofts   Consent Given by:  Patient Site marked: the procedure site was marked   Timeout: prior to procedure the correct patient, procedure, and site was verified   Indications:  Pain Location:  Hip Site:  R greater trochanter Prep: patient was prepped and draped in usual sterile fashion   Needle Size:  27 G Approach:  Superior Ultrasound  Guidance: No   Fluoroscopic Guidance: No   Arthrogram: No   Medications:  40 mg triamcinolone acetonide 40 MG/ML; 2 mL lidocaine 1 % Aspiration Attempted: Yes   Aspirate amount (mL):  0 Patient tolerance:  Patient tolerated the procedure well with no immediate complications  Right greater trochanteric bursa injected today. Patient's pain was severe. Dr. Estanislado Pandy agreed that it is reasonable to give one today even though there is one given about a month ago.   Allergies: Codeine; Penicillins; Sulfa antibiotics; Tramadol; Aromasin [exemestane]; Atorvastatin; Celexa [citalopram hydrobromide]; and Citalopram   Assessment / Plan:     Visit Diagnoses: Trochanteric bursitis of right hip  Fibromyalgia  Pain in right hip - Plan: XR HIPS BILAT W OR W/O PELVIS 3-4 VIEWS, CANCELED: XR HIPS BILAT W OR W/O PELVIS 2V  Sacroiliac joint pain - Plan: XR HIPS BILAT W OR W/O PELVIS 3-4 VIEWS, CANCELED: XR HIPS BILAT W OR W/O PELVIS 2V  Chronic kidney disease, unspecified CKD stage   Plan: #1: Fibromyalgia. Active disease with generalized pain  #2: Right greater trochanteric bursitis. Frequent flare over the last 3 months. She'll reports severe pain since the last 7-10 days Difficulty walking, sitting, doing activities of daily living. Recently responded well to cortisone injection in July 2018 and then had pain once again over the last 7-10 days. No falls or injuries fevers urinary tract infections etc.   #3: Right SI joint pain. Injected approximately 1 month ago. Pain return as described above in #2.  #4: History of chronic kidney disease stable. #5: X-ray of bilateral hips and pelvis   Orders: Orders Placed This Encounter  Procedures  . Large Joint Injection/Arthrocentesis  . XR HIPS BILAT W OR W/O PELVIS 3-4 VIEWS   Meds ordered this encounter  Medications  . baclofen (LIORESAL) 10 MG tablet    Sig: Take 1 tablet (10 mg total) by mouth 3 (three) times daily.    Dispense:  270  tablet    Refill:  1    Face-to-face time spent  with patient was 30 minutes. 50% of time was spent in counseling and coordination of care.  Follow-Up Instructions: Return as scheduled, for FMS, FATIGUE,INSOMNIA,rt si joint pain, rt gr tr bursitis,.   Eliezer Lofts, PA-C I examined and evaluated the patient with Eliezer Lofts PA. Patient had painful range of motion of her right joint on the exam although her x-ray was unremarkable. She's been having tenderness over right trochanteric bursa area consistent with trochanteric bursitis. She had inadequate response to injection. We will refer to physical therapy for treatment of right trochanteric bursitis. She is also having some discomfort in the right SI joint The plan of care was discussed as noted above.  Bo Merino, MD Note - This record has been created using [employer].  Chart creation errors have been sought, but may not always  have been located. Such creation errors do not reflect on  the standard of medical care.

## 2017-03-18 ENCOUNTER — Ambulatory Visit: Payer: BLUE CROSS/BLUE SHIELD | Admitting: Rheumatology

## 2017-04-30 ENCOUNTER — Telehealth: Payer: Self-pay

## 2017-04-30 NOTE — Telephone Encounter (Signed)
Patient is still having Hip Pain and would like to know what will the next step be?  Stated that physical therapy helped some?  CB# is [phone].  Please advise.  Thank you.

## 2017-05-01 ENCOUNTER — Telehealth: Payer: Self-pay

## 2017-05-01 NOTE — Telephone Encounter (Signed)
Patient was returning call to [facility].  CB# is [phone].  Please advise.  Thank you.

## 2017-05-01 NOTE — Telephone Encounter (Signed)
Attempted to contact the patient and left message for pateint to call the office.

## 2017-05-04 NOTE — Telephone Encounter (Signed)
Patient can have repeat injection. Continue physical therapy.

## 2017-05-04 NOTE — Telephone Encounter (Signed)
Patient has been scheduled for Friday 05/08/17 at 1:30 pm

## 2017-05-04 NOTE — Telephone Encounter (Signed)
See previous phone note.  

## 2017-05-04 NOTE — Telephone Encounter (Signed)
Patient returned call to the office. Attempted to return patient's call and left message for patient to call the office.  

## 2017-05-04 NOTE — Telephone Encounter (Signed)
Patient states she has injections in her hips around 02/18/17. Patient states she has been in PT and has had her last PT session. Patient states she has continued to have pain and is unable to sleep on her right hip. Patient states she she is having pain with walking and going up steps. Patient is using ibuprofen for the discomfort.  Patient would like to know what else she can do.

## 2017-05-08 ENCOUNTER — Ambulatory Visit: Payer: Self-pay | Admitting: Rheumatology

## 2017-05-08 ENCOUNTER — Ambulatory Visit (INDEPENDENT_AMBULATORY_CARE_PROVIDER_SITE_OTHER): Payer: BLUE CROSS/BLUE SHIELD | Admitting: Rheumatology

## 2017-05-08 VITALS — BP 121/83 | HR 89

## 2017-05-08 DIAGNOSIS — M7061 Trochanteric bursitis, right hip: Secondary | ICD-10-CM | POA: Diagnosis not present

## 2017-05-08 MED ORDER — TRIAMCINOLONE ACETONIDE 40 MG/ML IJ SUSP
40.0000 mg | INTRAMUSCULAR | Status: AC | PRN
  Administered 2017-05-08: 40 mg via INTRA_ARTICULAR

## 2017-05-08 MED ORDER — LIDOCAINE HCL 1 % IJ SOLN
1.5000 mL | INTRAMUSCULAR | Status: AC | PRN
  Administered 2017-05-08: 1.5 mL

## 2017-05-08 NOTE — Progress Notes (Signed)
   Procedure Note  Patient: Whitney Ward             Date of Birth: 27-Apr-1951           MRN: [id number]             Visit Date: 05/08/2017  Procedures: Visit Diagnoses: Trochanteric bursitis of right hip  Large Joint Inj: R greater trochanter on 05/08/2017 12:05 PM Indications: pain Details: 27 G 1.5 in needle, lateral approach  Arthrogram: No  Medications: 1.5 mL lidocaine 1 %; 40 mg triamcinolone acetonide 40 MG/ML Aspirate: 0 mL Outcome: tolerated well, no immediate complications Consent was given by the patient. Immediately prior to procedure a time out was called to verify the correct patient, procedure, equipment, support staff and site/side marked as required. Patient was prepped and draped in the usual sterile fashion.     Bo Merino, MD Bo Merino, MD

## 2017-06-06 NOTE — Progress Notes (Signed)
Office Visit Note  Patient: Whitney Ward             Date of Birth: [birth year]           MRN: [id number]             PCP: Rocky Morel, MD Referring: Huey Romans* Visit Date: 06/17/2017 Occupation: @GUAROCC @    Subjective:  Hand pain   History of Present Illness: KRYSTIE LEITER is a 66 y.o. female with history of fibromyalgia and osteoarthritis.  Patient states her fibromyalgia has been fairly well managed with no recent flares.  She states she continues to take Baclofen, Celebrex, Gabapenin, and Klonopin.  She also takes Trazodone to help her sleep.  She states she performs exercises and stretching at home which have helped.  She states her knees and right shoulder are doing well.  She states physical therapy helped her knees and she still performs some of the exercises.  She reports occasional hand pain and swelling.  She still experiences symptoms of neuropathy. She states her trochanteric bursitis has been doing well.     Activities of Daily Living:  Patient reports morning stiffness for 1 hour.   Patient Denies nocturnal pain.  Difficulty dressing/grooming: Denies Difficulty climbing stairs: Denies Difficulty getting out of chair: Denies Difficulty using hands for taps, buttons, cutlery, and/or writing: Reports   Review of Systems  Constitutional: Positive for fatigue. Negative for weakness.  HENT: Negative for mouth dryness.   Eyes: Negative for dryness.  Respiratory: Negative for cough and shortness of breath.   Cardiovascular: Negative for chest pain, palpitations, hypertension and swelling in legs/feet.  Gastrointestinal: Positive for constipation. Negative for blood in stool and diarrhea.  Endocrine: Negative for increased urination.  Genitourinary: Negative for painful urination.  Musculoskeletal: Negative for arthralgias, joint pain, joint swelling, myalgias, muscle weakness, morning stiffness, muscle tenderness and myalgias.    Skin: Negative for color change, pallor, rash, hair loss, nodules/bumps, redness, skin tightness, ulcers and sensitivity to sunlight.  Neurological: Negative for dizziness, numbness and headaches.  Hematological: Negative for swollen glands.  Psychiatric/Behavioral: Positive for sleep disturbance. Negative for depressed mood. The patient is not nervous/anxious.     PMFS History:  Patient Active Problem List   Diagnosis Date Noted  . Fibromyalgia 10/09/2016  . Primary insomnia 10/09/2016  . Other fatigue 10/09/2016  . Tendinopathy of right shoulder 10/09/2016  . Trochanteric bursitis of right hip 10/09/2016  . Primary osteoarthritis of both hands 10/09/2016  . Chronic kidney disease 10/09/2016  . Essential hypertension 10/09/2016  . Gastroesophageal reflux disease 10/09/2016  . Restless leg syndrome 10/09/2016  . Malignant neoplasm of female breast ([place]) 10/09/2016  . History of breast cancer 10/09/2016  . Genital herpes simplex 10/09/2016    Past Medical History:  Diagnosis Date  . Arthritis   . Breast cancer ([place])   . Breast cancer ([place])   . Fibromyalgia   . Hypertension     History reviewed. No pertinent family history. Past Surgical History:  Procedure Laterality Date  . ABDOMINAL HYSTERECTOMY    . BREAST SURGERY    . CESAREAN SECTION     Social History   Social History Narrative  . Not on file     Objective: Vital Signs: BP (!) 181/89 (BP Location: Right Arm, Patient Position: Sitting)   Pulse 74   Resp 13   Ht 6' (1.829 m)   Wt 214 lb (97.1 kg)   BMI 29.02 kg/m    Physical Exam  Constitutional: She is oriented to person, place, and time. She appears well-developed and well-nourished.  HENT:  Head: Normocephalic and atraumatic.  Eyes: Conjunctivae and EOM are normal.  Neck: Normal range of motion.  Cardiovascular: Normal rate, regular rhythm, normal heart sounds and intact distal pulses.  Pulmonary/Chest: Effort normal and breath sounds normal.   Abdominal: Soft. Bowel sounds are normal.  Lymphadenopathy:    She has no cervical adenopathy.  Neurological: She is alert and oriented to person, place, and time.  Skin: Skin is warm and dry. Capillary refill takes less than 2 seconds.  Psychiatric: She has a normal mood and affect. Her behavior is normal.  Nursing note and vitals reviewed.    Musculoskeletal Exam: C-spine, thoracic, and lumbar spine good ROM.  Tenderness of trapezius muscle.  Shoulder joints, elbow joints, and wrist joints good ROM.  MCPs, PIPs, and DIPs good ROM with no synovitis.  Complete fist formation.  Hip joints, knee joints, and ankle joints good ROM with no synovitis.  No effusion or warmth of bilateral knees.  Slightly limited flexion of knee joints due to discomfort.  MTPs, PIPs, and DIPs good ROM with no synovitis.  No tenderness of trochanteric bursa bilaterally.   CDAI Exam: No CDAI exam completed.    Investigation: No additional findings.   Imaging: No results found.  Speciality Comments: No specialty comments available.    Procedures:  No procedures performed Allergies: Codeine; Penicillins; Sulfa antibiotics; Tramadol; Aromasin [exemestane]; Atorvastatin; Celexa [citalopram hydrobromide]; Citalopram; and Tamoxifen   Assessment / Plan:     Visit Diagnoses: Fibromyalgia: She reports no recent flares.  Her current medical management has been controlling her fibromyalgia.  She is taking Klonopin, Baclofen, Celebrex, and Gabapentin.    Primary insomnia: Continues to have insomnia.  She states trazodone helps her sleep better at night.  Discussed good sleep hygiene.    Other fatigue: Chronic   Medication monitoring encounter: patient is taking Celebrex.  Her last labs were in April 2018.  CBC and CMP will be drawn today to continue monitoring renal and liver function.   Tendinopathy of right shoulder: No discomfort currently.  Good ROM   Trochanteric bursitis of right hip: Resolved.   Primary  osteoarthritis of both hands: No synovitis on exam.  Complete fist formation.  Discussed muscle strengthening and joint protection.   Other medical conditions are listed as follows:   Chronic kidney disease, unspecified CKD stage  History of gastroesophageal reflux (GERD)  History of hypertension  History of breast cancer - September 2012, is status post lumpectomy chemotherapy and radiation therapy  Restless leg syndrome  Genital herpes simplex, unspecified site - h/o    Orders: Orders Placed This Encounter  Procedures  . CBC with Differential/Platelet  . COMPLETE METABOLIC PANEL WITH GFR   No orders of the defined types were placed in this encounter.   Bo Merino, MD Follow-Up Instructions: Return in about 5 months (around 11/15/2017) for Fibromyalgia, Osteoarthritis.   Note - This record has been created using [employer].  Chart creation errors have been sought, but may not always  have been located. Such creation errors do not reflect on  the standard of medical care.

## 2017-06-17 ENCOUNTER — Ambulatory Visit (INDEPENDENT_AMBULATORY_CARE_PROVIDER_SITE_OTHER): Payer: BLUE CROSS/BLUE SHIELD | Admitting: Rheumatology

## 2017-06-17 ENCOUNTER — Encounter: Payer: Self-pay | Admitting: Rheumatology

## 2017-06-17 VITALS — BP 181/89 | HR 74 | Resp 13 | Ht 72.0 in | Wt 214.0 lb

## 2017-06-17 DIAGNOSIS — Z8679 Personal history of other diseases of the circulatory system: Secondary | ICD-10-CM | POA: Diagnosis not present

## 2017-06-17 DIAGNOSIS — M7061 Trochanteric bursitis, right hip: Secondary | ICD-10-CM | POA: Diagnosis not present

## 2017-06-17 DIAGNOSIS — M797 Fibromyalgia: Secondary | ICD-10-CM | POA: Diagnosis not present

## 2017-06-17 DIAGNOSIS — A6000 Herpesviral infection of urogenital system, unspecified: Secondary | ICD-10-CM | POA: Diagnosis not present

## 2017-06-17 DIAGNOSIS — Z8719 Personal history of other diseases of the digestive system: Secondary | ICD-10-CM | POA: Diagnosis not present

## 2017-06-17 DIAGNOSIS — M19042 Primary osteoarthritis, left hand: Secondary | ICD-10-CM

## 2017-06-17 DIAGNOSIS — N189 Chronic kidney disease, unspecified: Secondary | ICD-10-CM | POA: Diagnosis not present

## 2017-06-17 DIAGNOSIS — M67911 Unspecified disorder of synovium and tendon, right shoulder: Secondary | ICD-10-CM

## 2017-06-17 DIAGNOSIS — Z853 Personal history of malignant neoplasm of breast: Secondary | ICD-10-CM

## 2017-06-17 DIAGNOSIS — F5101 Primary insomnia: Secondary | ICD-10-CM

## 2017-06-17 DIAGNOSIS — G2581 Restless legs syndrome: Secondary | ICD-10-CM | POA: Diagnosis not present

## 2017-06-17 DIAGNOSIS — R5383 Other fatigue: Secondary | ICD-10-CM | POA: Diagnosis not present

## 2017-06-17 DIAGNOSIS — M19041 Primary osteoarthritis, right hand: Secondary | ICD-10-CM | POA: Diagnosis not present

## 2017-06-17 DIAGNOSIS — Z5181 Encounter for therapeutic drug level monitoring: Secondary | ICD-10-CM

## 2017-06-17 LAB — COMPLETE METABOLIC PANEL WITH GFR
AG Ratio: 1.8 (calc) (ref 1.0–2.5)
ALKALINE PHOSPHATASE (APISO): 96 U/L (ref 33–130)
ALT: 13 U/L (ref 6–29)
AST: 16 U/L (ref 10–35)
Albumin: 4.4 g/dL (ref 3.6–5.1)
BILIRUBIN TOTAL: 0.4 mg/dL (ref 0.2–1.2)
BUN: 12 mg/dL (ref 7–25)
CHLORIDE: 105 mmol/L (ref 98–110)
CO2: 31 mmol/L (ref 20–32)
CREATININE: 0.85 mg/dL (ref 0.50–0.99)
Calcium: 9.5 mg/dL (ref 8.6–10.4)
GFR, EST AFRICAN AMERICAN: 82 mL/min/{1.73_m2} (ref >=60)
GFR, Est Non African American: 71 mL/min/{1.73_m2} (ref >=60)
Globulin: 2.5 g/dL (calc) (ref 1.9–3.7)
Glucose, Bld: 90 mg/dL (ref 65–99)
Potassium: 3.6 mmol/L (ref 3.5–5.3)
Sodium: 141 mmol/L (ref 135–146)
TOTAL PROTEIN: 6.9 g/dL (ref 6.1–8.1)

## 2017-06-17 LAB — CBC WITH DIFFERENTIAL/PLATELET
BASOS PCT: 0.2 %
Basophils Absolute: 9 cells/uL (ref 0–200)
EOS ABS: 60 {cells}/uL (ref 15–500)
EOS PCT: 1.4 %
HCT: 33.5 % — ABNORMAL LOW (ref 35.0–45.0)
Hemoglobin: 11.4 g/dL — ABNORMAL LOW (ref 11.7–15.5)
Lymphs Abs: 1595 cells/uL (ref 850–3900)
MCH: 30.9 pg (ref 27.0–33.0)
MCHC: 34 g/dL (ref 32.0–36.0)
MCV: 90.8 fL (ref 80.0–100.0)
MONOS PCT: 9.7 %
MPV: 10.6 fL (ref 7.5–12.5)
NEUTROS ABS: 2219 {cells}/uL (ref 1500–7800)
Neutrophils Relative %: 51.6 %
PLATELETS: 265 10*3/uL (ref 140–400)
RBC: 3.69 10*6/uL — ABNORMAL LOW (ref 3.80–5.10)
RDW: 12.6 % (ref 11.0–15.0)
TOTAL LYMPHOCYTE: 37.1 %
WBC mixed population: 417 cells/uL (ref 200–950)
WBC: 4.3 10*3/uL (ref 3.8–10.8)

## 2017-06-18 NOTE — Progress Notes (Signed)
CBC reveals anemia.  Instruct patient to continue to take Ferrous Sulfate 325mg .  We will continue to monitor.

## 2017-08-14 ENCOUNTER — Telehealth: Payer: Self-pay | Admitting: Rheumatology

## 2017-08-14 MED ORDER — CLONAZEPAM 1 MG PO TABS: 1.0000 mg | ORAL_TABLET | Freq: Every evening | ORAL | 1 refills | Status: DC | PRN

## 2017-08-14 NOTE — Telephone Encounter (Signed)
Patient request a refill on her Klonopin. Patient uses Paediatric nurse in [facility] on [address].

## 2017-08-14 NOTE — Telephone Encounter (Signed)
Last Visit: 06/17/17 Next Visit: 11/27/17  Okay to refill Klonopin?

## 2017-10-18 ENCOUNTER — Other Ambulatory Visit: Payer: Self-pay | Admitting: Physician Assistant

## 2017-10-19 NOTE — Telephone Encounter (Signed)
Last Visit: 06/17/17 Next Visit: 11/27/17  Okay to refill Clonazepam?

## 2017-11-13 NOTE — Progress Notes (Deleted)
Office Visit Note  Patient: Whitney Ward             Date of Birth: [birth year]           MRN: [id number]             PCP: Rocky Morel, MD Referring: Huey Romans* Visit Date: 11/27/2017 Occupation: @GUAROCC @    Subjective:  No chief complaint on file.   History of Present Illness: ARALYNN BRAKE is a 67 y.o. female ***   Activities of Daily Living:  Patient reports morning stiffness for *** {minute/hour:19697}.   Patient {ACTIONS;DENIES/REPORTS:21021675::"Denies"} nocturnal pain.  Difficulty dressing/grooming: {ACTIONS;DENIES/REPORTS:21021675::"Denies"} Difficulty climbing stairs: {ACTIONS;DENIES/REPORTS:21021675::"Denies"} Difficulty getting out of chair: {ACTIONS;DENIES/REPORTS:21021675::"Denies"} Difficulty using hands for taps, buttons, cutlery, and/or writing: {ACTIONS;DENIES/REPORTS:21021675::"Denies"}   No Rheumatology ROS completed.   PMFS History:  Patient Active Problem List   Diagnosis Date Noted  . Fibromyalgia 10/09/2016  . Primary insomnia 10/09/2016  . Other fatigue 10/09/2016  . Tendinopathy of right shoulder 10/09/2016  . Trochanteric bursitis of right hip 10/09/2016  . Primary osteoarthritis of both hands 10/09/2016  . Chronic kidney disease 10/09/2016  . Essential hypertension 10/09/2016  . Gastroesophageal reflux disease 10/09/2016  . Restless leg syndrome 10/09/2016  . Malignant neoplasm of female breast ([place]) 10/09/2016  . History of breast cancer 10/09/2016  . Genital herpes simplex 10/09/2016    Past Medical History:  Diagnosis Date  . Arthritis   . Breast cancer ([place])   . Breast cancer ([place])   . Fibromyalgia   . Hypertension     No family history on file. Past Surgical History:  Procedure Laterality Date  . ABDOMINAL HYSTERECTOMY    . BREAST SURGERY    . CESAREAN SECTION     Social History   Social History Narrative  . Not on file     Objective: Vital Signs: There were no vitals taken  for this visit.   Physical Exam   Musculoskeletal Exam: ***  CDAI Exam: No CDAI exam completed.    Investigation: No additional findings. CBC Latest Ref Rng & Units 06/17/2017 10/15/2016 05/07/2012  WBC 3.8 - 10.8 Thousand/uL 4.3 3.5(L) 6.8  Hemoglobin 11.7 - 15.5 g/dL 11.4(L) 12.4 13.2  Hematocrit 35.0 - 45.0 % 33.5(L) 37.4 38.3  Platelets 140 - 400 Thousand/uL 265 275 210   CMP Latest Ref Rng & Units 06/17/2017 10/15/2016 05/07/2012  Glucose 65 - 99 mg/dL 90 153(H) 137(H)  BUN 7 - 25 mg/dL 12 16 12   Creatinine 0.50 - 0.99 mg/dL 0.85 1.05(H) 0.90  Sodium 135 - 146 mmol/L 141 141 142  Potassium 3.5 - 5.3 mmol/L 3.6 3.6 3.6  Chloride 98 - 110 mmol/L 105 102 103  CO2 20 - 32 mmol/L 31 28 26   Calcium 8.6 - 10.4 mg/dL 9.5 9.6 9.3  Total Protein 6.1 - 8.1 g/dL 6.9 7.1 7.7  Total Bilirubin 0.2 - 1.2 mg/dL 0.4 0.5 0.4  Alkaline Phos 33 - 130 U/L - 99 88  AST 10 - 35 U/L 16 26 25   ALT 6 - 29 U/L 13 21 21     Imaging: No results found.  Speciality Comments: No specialty comments available.    Procedures:  No procedures performed Allergies: Codeine; Penicillins; Sulfa antibiotics; Tramadol; Aromasin [exemestane]; Atorvastatin; Celexa [citalopram hydrobromide]; Citalopram; and Tamoxifen   Assessment / Plan:     Visit Diagnoses: Fibromyalgia  Primary insomnia  Other fatigue  Tendinopathy of right shoulder  Trochanteric bursitis of right hip  Primary osteoarthritis of both hands  Chronic kidney disease, unspecified CKD stage  History of gastroesophageal reflux (GERD)  History of hypertension  Restless leg syndrome  History of breast cancer  Genital herpes simplex, unspecified site  Medication monitoring encounter  Sacroiliac joint pain    Orders: No orders of the defined types were placed in this encounter.  No orders of the defined types were placed in this encounter.   Face-to-face time spent with patient was *** minutes. 50% of time was spent in  counseling and coordination of care.  Follow-Up Instructions: No follow-ups on file.   Whitney Neas, PA-C  Note - This record has been created using Dragon software.  Chart creation errors have been sought, but may not always  have been located. Such creation errors do not reflect on  the standard of medical care.

## 2017-11-26 NOTE — Progress Notes (Signed)
Office Visit Note  Patient: Whitney Ward             Date of Birth: [birth year]           MRN: [id number]             PCP: Rocky Morel, MD Referring: Huey Romans* Visit Date: 11/30/2017 Occupation: @GUAROCC @    Subjective:  Right SI joint pain   History of Present Illness: Whitney Ward is a 67 y.o. female with history of fibromyalgia and osteoarthritis.  Patient is currently on Klonopin, baclofen, Celebrex, and gabapentin for management of fibromyalgia.  She reports that her fibromyalgia has been flaring more frequently.  She states that she is having generalized muscle aches and muscle tenderness.  She states that when she is having a flare she tries to rest and she base in Epsom salts.  She also takes Celebrex for pain relief.  She states that her insomnia has improved slightly but her fatigue continues to worsen.  She states that about 2 weeks ago she fell on the stairs and fractured her left second toe.  She was not placed in a boot.  She states she is fully weightbearing and ices and elevates her foot periodically.  She states that about 2 weeks ago she developed right SI joint pain.  She reports that her right shoulder has been doing well.  She states that she has intermittent swelling and stiffness in her bilateral hands.    Activities of Daily Living:  Patient reports morning stiffness for 1-2 hours.   Patient Reports nocturnal pain.  Difficulty dressing/grooming: Denies Difficulty climbing stairs: Reports Difficulty getting out of chair: Reports Difficulty using hands for taps, buttons, cutlery, and/or writing: Denies   Review of Systems  Constitutional: Positive for fatigue.  HENT: Negative for mouth sores, mouth dryness and nose dryness.   Eyes: Negative for pain, visual disturbance and dryness.  Respiratory: Negative for cough, hemoptysis, shortness of breath and difficulty breathing.   Cardiovascular: Negative for chest pain,  palpitations, hypertension and swelling in legs/feet.  Gastrointestinal: Negative for blood in stool, constipation and diarrhea.  Endocrine: Negative for increased urination.  Genitourinary: Negative for painful urination.  Musculoskeletal: Positive for arthralgias, joint pain, myalgias, morning stiffness, muscle tenderness and myalgias. Negative for joint swelling and muscle weakness.  Skin: Negative for color change, pallor, rash, hair loss, nodules/bumps, skin tightness, ulcers and sensitivity to sunlight.  Allergic/Immunologic: Negative for susceptible to infections.  Neurological: Negative for dizziness, numbness, headaches and weakness.  Hematological: Negative for swollen glands.  Psychiatric/Behavioral: Positive for sleep disturbance. Negative for depressed mood. The patient is not nervous/anxious.     PMFS History:  Patient Active Problem List   Diagnosis Date Noted  . Fibromyalgia 10/09/2016  . Primary insomnia 10/09/2016  . Other fatigue 10/09/2016  . Tendinopathy of right shoulder 10/09/2016  . Trochanteric bursitis of right hip 10/09/2016  . Primary osteoarthritis of both hands 10/09/2016  . Chronic kidney disease 10/09/2016  . Essential hypertension 10/09/2016  . Gastroesophageal reflux disease 10/09/2016  . Restless leg syndrome 10/09/2016  . Malignant neoplasm of female breast ([place]) 10/09/2016  . History of breast cancer 10/09/2016  . Genital herpes simplex 10/09/2016    Past Medical History:  Diagnosis Date  . Arthritis   . Breast cancer ([place])   . Breast cancer ([place])   . Fibromyalgia   . Hypertension     History reviewed. No pertinent family history. Past Surgical History:  Procedure Laterality Date  .  ABDOMINAL HYSTERECTOMY    . BREAST SURGERY    . CESAREAN SECTION    . SMALL INTESTINE SURGERY     Social History   Social History Narrative  . Not on file     Objective: Vital Signs: BP 111/70 (BP Location: Right Arm, Patient Position: Sitting, Cuff  Size: Small)   Pulse 71   Resp 12   Ht 6' (1.829 m)   Wt 217 lb (98.4 kg)   BMI 29.43 kg/m    Physical Exam  Constitutional: She is oriented to person, place, and time. She appears well-developed and well-nourished.  HENT:  Head: Normocephalic and atraumatic.  Eyes: Conjunctivae and EOM are normal.  Neck: Normal range of motion.  Cardiovascular: Normal rate, regular rhythm, normal heart sounds and intact distal pulses.  Pulmonary/Chest: Effort normal and breath sounds normal.  Abdominal: Soft. Bowel sounds are normal.  Lymphadenopathy:    She has no cervical adenopathy.  Neurological: She is alert and oriented to person, place, and time.  Skin: Skin is warm and dry. Capillary refill takes less than 2 seconds.  Psychiatric: She has a normal mood and affect. Her behavior is normal.  Nursing note and vitals reviewed.    Musculoskeletal Exam: C-spine, thoracic spine, and lumbar spine good range of motion.  No midline spinal tenderness.  She has right SI joint tenderness.  Shoulder joints, elbow joints, wrist joints, MCPs, PIPs, DIPs good range of motion no synovitis.  She has mild DIP and PIP synovial thickening consistent with osteoarthritis of bilateral hands.  Hip joints, knee joints, ankle joints, MTPs, PIPs, DIPs good range of motion with no synovitis.  No warmth or effusion of bilateral knee joints.  No tenderness of trochanteric bursa.     CDAI Exam: No CDAI exam completed.    Investigation: No additional findings. CBC Latest Ref Rng & Units 06/17/2017 10/15/2016 05/07/2012  WBC 3.8 - 10.8 Thousand/uL 4.3 3.5(L) 6.8  Hemoglobin 11.7 - 15.5 g/dL 11.4(L) 12.4 13.2  Hematocrit 35.0 - 45.0 % 33.5(L) 37.4 38.3  Platelets 140 - 400 Thousand/uL 265 275 210   CMP Latest Ref Rng & Units 06/17/2017 10/15/2016 05/07/2012  Glucose 65 - 99 mg/dL 90 153(H) 137(H)  BUN 7 - 25 mg/dL 12 16 12   Creatinine 0.50 - 0.99 mg/dL 0.85 1.05(H) 0.90  Sodium 135 - 146 mmol/L 141 141 142  Potassium  3.5 - 5.3 mmol/L 3.6 3.6 3.6  Chloride 98 - 110 mmol/L 105 102 103  CO2 20 - 32 mmol/L 31 28 26   Calcium 8.6 - 10.4 mg/dL 9.5 9.6 9.3  Total Protein 6.1 - 8.1 g/dL 6.9 7.1 7.7  Total Bilirubin 0.2 - 1.2 mg/dL 0.4 0.5 0.4  Alkaline Phos 33 - 130 U/L - 99 88  AST 10 - 35 U/L 16 26 25   ALT 6 - 29 U/L 13 21 21      Imaging: No results found.  Speciality Comments: No specialty comments available.    Procedures:  Sacroiliac Joint Inj on 11/30/2017 1:46 PM Indications: pain Details: 27 G 1.5 in needle, posterior approach Medications: 1 mL lidocaine 1 %; 40 mg triamcinolone acetonide 40 MG/ML Aspirate: 0 mL Outcome: tolerated well, no immediate complications Procedure, treatment alternatives, risks and benefits explained, specific risks discussed. Consent was given by the patient. Immediately prior to procedure a time out was called to verify the correct patient, procedure, equipment, support staff and site/side marked as required. Patient was prepped and draped in the usual sterile fashion.  Allergies: Codeine; Penicillins; Sulfa antibiotics; Tramadol; Aromasin [exemestane]; Atorvastatin; Celexa [citalopram hydrobromide]; Citalopram; and Tamoxifen   Assessment / Plan:     Visit Diagnoses: Fibromyalgia -her fibromyalgia has been flaring more frequently.  She has generalized hyperalgesia on exam.  She continues to have generalized muscle aches and muscle tenderness.  She is taking Klonopin, Baclofen, Celebrex, and Gabapentin.  She requested a refill of Celebrex today.  CBC and CMP are drawn today to monitor for drug toxicity.    Primary insomnia: Improved.    Other fatigue: Chronic and worsening.  She was encouraged to continue to exercise on a regular basis.  We discussed her trying to restart to going to yoga on a regular basis.  Tendinopathy of right shoulder: Resolved.  Trochanteric bursitis of right hip: She has no tenderness of the right trochanteric bursa today.  Primary  osteoarthritis of both hands: She has mild DIP and PIP synovial thickening consistent with osteoarthritis of bilateral hands.  She continues to have joint stiffness in her hands.  Joint protection and muscle strengthening were discussed.  She is also given a handout on natural anti-inflammatories that she can take.  Sacroiliac joint pain - Right SI joint: She has tenderness of the right SI joint.  She requested a cortisone injection today.  She tolerated the procedure well.  She is advised to monitor blood pressure closely following the cortisone injection today.  Medication monitoring encounter -CBC and CMP are done today to monitor for drug toxicity.  She requested Celebrex refill which was sent to the pharmacy.  Plan: CBC with Differential/Platelet, COMPLETE METABOLIC PANEL WITH GFR  Other medical conditions are listed as follows:  Chronic kidney disease, unspecified CKD stage  History of gastroesophageal reflux (GERD)  History of hypertension  History of breast cancer - September 2012, is status post lumpectomy chemotherapy and radiation therapy  Restless leg syndrome  Genital herpes simplex, unspecified site     Orders: Orders Placed This Encounter  Procedures  . Sacroiliac Joint Inj  . CBC with Differential/Platelet  . COMPLETE METABOLIC PANEL WITH GFR   Meds ordered this encounter  Medications  . celecoxib (CELEBREX) 200 MG capsule    Sig: Take 1 capsule (200 mg total) by mouth daily.    Dispense:  30 capsule    Refill:  5    Please consider 90 day supplies to promote better adherence    Face-to-face time spent with patient was 30 minutes. >50% of time was spent in counseling and coordination of care.  Follow-Up Instructions: Return in about 6 months (around 06/01/2018) for Fibromyalgia, Osteoarthritis.   Ofilia Neas, PA-C  Note - This record has been created using Dragon software.  Chart creation errors have been sought, but may not always  have been located.  Such creation errors do not reflect on  the standard of medical care.

## 2017-11-27 ENCOUNTER — Ambulatory Visit: Payer: BLUE CROSS/BLUE SHIELD | Admitting: Rheumatology

## 2017-11-27 ENCOUNTER — Ambulatory Visit: Payer: BLUE CROSS/BLUE SHIELD | Admitting: Physician Assistant

## 2017-11-30 ENCOUNTER — Ambulatory Visit (INDEPENDENT_AMBULATORY_CARE_PROVIDER_SITE_OTHER): Payer: BLUE CROSS/BLUE SHIELD | Admitting: Physician Assistant

## 2017-11-30 ENCOUNTER — Encounter: Payer: Self-pay | Admitting: Physician Assistant

## 2017-11-30 VITALS — BP 111/70 | HR 71 | Resp 12 | Ht 72.0 in | Wt 217.0 lb

## 2017-11-30 DIAGNOSIS — M19042 Primary osteoarthritis, left hand: Secondary | ICD-10-CM

## 2017-11-30 DIAGNOSIS — M19041 Primary osteoarthritis, right hand: Secondary | ICD-10-CM

## 2017-11-30 DIAGNOSIS — M533 Sacrococcygeal disorders, not elsewhere classified: Secondary | ICD-10-CM | POA: Diagnosis not present

## 2017-11-30 DIAGNOSIS — M67911 Unspecified disorder of synovium and tendon, right shoulder: Secondary | ICD-10-CM

## 2017-11-30 DIAGNOSIS — Z8679 Personal history of other diseases of the circulatory system: Secondary | ICD-10-CM

## 2017-11-30 DIAGNOSIS — M797 Fibromyalgia: Secondary | ICD-10-CM | POA: Diagnosis not present

## 2017-11-30 DIAGNOSIS — Z5181 Encounter for therapeutic drug level monitoring: Secondary | ICD-10-CM

## 2017-11-30 DIAGNOSIS — F5101 Primary insomnia: Secondary | ICD-10-CM

## 2017-11-30 DIAGNOSIS — N189 Chronic kidney disease, unspecified: Secondary | ICD-10-CM | POA: Diagnosis not present

## 2017-11-30 DIAGNOSIS — R5383 Other fatigue: Secondary | ICD-10-CM

## 2017-11-30 DIAGNOSIS — Z853 Personal history of malignant neoplasm of breast: Secondary | ICD-10-CM | POA: Diagnosis not present

## 2017-11-30 DIAGNOSIS — G2581 Restless legs syndrome: Secondary | ICD-10-CM | POA: Diagnosis not present

## 2017-11-30 DIAGNOSIS — Z8719 Personal history of other diseases of the digestive system: Secondary | ICD-10-CM

## 2017-11-30 DIAGNOSIS — A6000 Herpesviral infection of urogenital system, unspecified: Secondary | ICD-10-CM

## 2017-11-30 DIAGNOSIS — M7061 Trochanteric bursitis, right hip: Secondary | ICD-10-CM

## 2017-11-30 MED ORDER — CELECOXIB 200 MG PO CAPS: 200.0000 mg | ORAL_CAPSULE | Freq: Every day | ORAL | 5 refills | Status: DC

## 2017-11-30 MED ORDER — LIDOCAINE HCL 1 % IJ SOLN
1.0000 mL | INTRAMUSCULAR | Status: AC | PRN
  Administered 2017-11-30: 1 mL

## 2017-11-30 MED ORDER — TRIAMCINOLONE ACETONIDE 40 MG/ML IJ SUSP
40.0000 mg | INTRAMUSCULAR | Status: AC | PRN
  Administered 2017-11-30: 40 mg via INTRA_ARTICULAR

## 2017-11-30 NOTE — Patient Instructions (Signed)
Natural anti-inflammatories  You can purchase these at [employer], [employer] or online.  . Turmeric (capsules)  . Ginger (ginger root or capsules)  . Omega 3 (Fish, flax seeds, chia seeds, walnuts, almonds)  . Tart cherry (dried or extract)   Patient should be under the care of a physician while taking these supplements. This may not be reproduced without the permission of Dr. Shaili Deveshwar.  

## 2017-12-01 LAB — COMPLETE METABOLIC PANEL WITH GFR
AG Ratio: 1.6 (calc) (ref 1.0–2.5)
ALKALINE PHOSPHATASE (APISO): 101 U/L (ref 33–130)
ALT: 13 U/L (ref 6–29)
AST: 19 U/L (ref 10–35)
Albumin: 4.7 g/dL (ref 3.6–5.1)
BUN/Creatinine Ratio: 18 (calc) (ref 6–22)
BUN: 19 mg/dL (ref 7–25)
CALCIUM: 9.8 mg/dL (ref 8.6–10.4)
CO2: 31 mmol/L (ref 20–32)
CREATININE: 1.05 mg/dL — AB (ref 0.50–0.99)
Chloride: 102 mmol/L (ref 98–110)
GFR, EST NON AFRICAN AMERICAN: 55 mL/min/{1.73_m2} — AB (ref >=60)
GFR, Est African American: 64 mL/min/{1.73_m2} (ref >=60)
GLOBULIN: 2.9 g/dL (ref 1.9–3.7)
GLUCOSE: 85 mg/dL (ref 65–99)
Potassium: 3.7 mmol/L (ref 3.5–5.3)
SODIUM: 141 mmol/L (ref 135–146)
Total Bilirubin: 0.6 mg/dL (ref 0.2–1.2)
Total Protein: 7.6 g/dL (ref 6.1–8.1)

## 2017-12-01 LAB — CBC WITH DIFFERENTIAL/PLATELET
BASOS PCT: 0.4 %
Basophils Absolute: 18 cells/uL (ref 0–200)
EOS PCT: 1.1 %
Eosinophils Absolute: 50 cells/uL (ref 15–500)
HEMATOCRIT: 35.4 % (ref 35.0–45.0)
HEMOGLOBIN: 12.1 g/dL (ref 11.7–15.5)
LYMPHS ABS: 1670 {cells}/uL (ref 850–3900)
MCH: 30.6 pg (ref 27.0–33.0)
MCHC: 34.2 g/dL (ref 32.0–36.0)
MCV: 89.6 fL (ref 80.0–100.0)
MPV: 10.2 fL (ref 7.5–12.5)
Monocytes Relative: 11.2 %
NEUTROS ABS: 2259 {cells}/uL (ref 1500–7800)
Neutrophils Relative %: 50.2 %
Platelets: 268 10*3/uL (ref 140–400)
RBC: 3.95 10*6/uL (ref 3.80–5.10)
RDW: 12.5 % (ref 11.0–15.0)
Total Lymphocyte: 37.1 %
WBC: 4.5 10*3/uL (ref 3.8–10.8)
WBCMIX: 504 {cells}/uL (ref 200–950)

## 2017-12-01 NOTE — Progress Notes (Signed)
Creatinine elevated and GFR low.  She is on Celebrex 200 mg 1 tablet daily.  Please advise her to take Celebrex PRN or at least limit to 1 tablet every other day.  All other labs are WNL.

## 2017-12-25 ENCOUNTER — Other Ambulatory Visit: Payer: Self-pay | Admitting: Physician Assistant

## 2017-12-25 ENCOUNTER — Telehealth: Payer: Self-pay | Admitting: Rheumatology

## 2017-12-25 NOTE — Telephone Encounter (Signed)
last Visit: 11/30/17 Next Visit: 06/02/18  Okay to refill Klonopin?

## 2017-12-25 NOTE — Telephone Encounter (Signed)
Patient called requesting prescription refill for Clonazepam to be sent to [place] on [facility] in [place].

## 2017-12-25 NOTE — Telephone Encounter (Signed)
Refill request sent to [facility].

## 2018-01-26 ENCOUNTER — Telehealth: Payer: Self-pay | Admitting: Rheumatology

## 2018-01-26 MED ORDER — BACLOFEN 10 MG PO TABS: 10.0000 mg | ORAL_TABLET | Freq: Three times a day (TID) | ORAL | 0 refills | Status: DC

## 2018-01-26 NOTE — Telephone Encounter (Signed)
Patient called requesting prescription refill of Baclofen to be sent to [facility] on [address] [facility] in [place].

## 2018-01-26 NOTE — Telephone Encounter (Signed)
last Visit: 11/30/17 Next Visit: 06/02/18  Okay to refill per Dr. Estanislado Pandy

## 2018-02-16 ENCOUNTER — Other Ambulatory Visit: Payer: Self-pay | Admitting: Physician Assistant

## 2018-02-17 NOTE — Telephone Encounter (Signed)
Do not refill until 02/25/18.  She was given 30 day supply with 1 refill on 12/28/17.

## 2018-02-17 NOTE — Telephone Encounter (Signed)
Last Visit: 11/30/17 Next Visit: 06/02/18  Last Fill: 12/28/17  Okay to refill Klonopin?

## 2018-02-18 ENCOUNTER — Other Ambulatory Visit: Payer: Self-pay | Admitting: Physician Assistant

## 2018-02-24 ENCOUNTER — Other Ambulatory Visit: Payer: Self-pay | Admitting: Physician Assistant

## 2018-02-25 NOTE — Telephone Encounter (Signed)
Last Visit: 11/30/17 Next Visit: 06/02/18  Last Fill: 12/28/17  Okay to refill Klonopin?

## 2018-04-01 ENCOUNTER — Encounter (HOSPITAL_BASED_OUTPATIENT_CLINIC_OR_DEPARTMENT_OTHER): Payer: Self-pay | Admitting: *Deleted

## 2018-04-01 ENCOUNTER — Other Ambulatory Visit: Payer: Self-pay

## 2018-04-01 ENCOUNTER — Emergency Department (HOSPITAL_BASED_OUTPATIENT_CLINIC_OR_DEPARTMENT_OTHER)
Admission: EM | Admit: 2018-04-01 | Discharge: 2018-04-01 | Disposition: A | Discharge status: Home / Self Care | Payer: BLUE CROSS/BLUE SHIELD | Attending: Emergency Medicine | Admitting: Emergency Medicine

## 2018-04-01 ENCOUNTER — Emergency Department (HOSPITAL_BASED_OUTPATIENT_CLINIC_OR_DEPARTMENT_OTHER): Payer: BLUE CROSS/BLUE SHIELD

## 2018-04-01 DIAGNOSIS — N189 Chronic kidney disease, unspecified: Secondary | ICD-10-CM | POA: Diagnosis not present

## 2018-04-01 DIAGNOSIS — B9789 Other viral agents as the cause of diseases classified elsewhere: Secondary | ICD-10-CM | POA: Diagnosis not present

## 2018-04-01 DIAGNOSIS — I129 Hypertensive chronic kidney disease with stage 1 through stage 4 chronic kidney disease, or unspecified chronic kidney disease: Secondary | ICD-10-CM | POA: Diagnosis not present

## 2018-04-01 DIAGNOSIS — Z79899 Other long term (current) drug therapy: Secondary | ICD-10-CM | POA: Insufficient documentation

## 2018-04-01 DIAGNOSIS — H73893 Other specified disorders of tympanic membrane, bilateral: Secondary | ICD-10-CM | POA: Insufficient documentation

## 2018-04-01 DIAGNOSIS — H6593 Unspecified nonsuppurative otitis media, bilateral: Secondary | ICD-10-CM

## 2018-04-01 DIAGNOSIS — Z7982 Long term (current) use of aspirin: Secondary | ICD-10-CM | POA: Diagnosis not present

## 2018-04-01 DIAGNOSIS — J069 Acute upper respiratory infection, unspecified: Secondary | ICD-10-CM | POA: Diagnosis not present

## 2018-04-01 DIAGNOSIS — R05 Cough: Secondary | ICD-10-CM | POA: Diagnosis present

## 2018-04-01 MED ORDER — FLUCONAZOLE 200 MG PO TABS: 200.0000 mg | ORAL_TABLET | ORAL | 0 refills | Status: AC

## 2018-04-01 MED ORDER — FEXOFENADINE-PSEUDOEPHED ER 60-120 MG PO TB12: 1.0000 | ORAL_TABLET | Freq: Two times a day (BID) | ORAL | 0 refills | Status: DC

## 2018-04-01 MED ORDER — FLUTICASONE PROPIONATE 50 MCG/ACT NA SUSP: 1.0000 | Freq: Every day | NASAL | 0 refills | Status: DC

## 2018-04-01 NOTE — ED Triage Notes (Signed)
Cough, sore throat. She took a zpak a week ago for same and is no better. Her right ear is stopped up.

## 2018-04-01 NOTE — ED Notes (Signed)
C/o cough, chest congestion, bilateral ear pain  Diff hearing in rt ear sore throat, x 2 weeks  Was seen and given z pak  No relief  No relief from otc meds

## 2018-04-10 NOTE — ED Provider Notes (Signed)
[facility] Provider Note   CSN: [id number] Arrival date & time: 04/01/18  4709     History   Chief Complaint Chief Complaint  Patient presents with  . Cough    HPI Whitney Ward is a 67 y.o. female.  HPI   67 year old female with your type symptoms.  Onset over a week ago.  Cough.  Sore throat.  Feels congested.  Bilateral ear pressure and some pain in her right ear.  She reports that she recently finished a course of azithromycin without improvement.  Past Medical History:  Diagnosis Date  . Arthritis   . Breast cancer ([place])   . Breast cancer ([place])   . Fibromyalgia   . Hypertension     Patient Active Problem List   Diagnosis Date Noted  . Fibromyalgia 10/09/2016  . Primary insomnia 10/09/2016  . Other fatigue 10/09/2016  . Tendinopathy of right shoulder 10/09/2016  . Trochanteric bursitis of right hip 10/09/2016  . Primary osteoarthritis of both hands 10/09/2016  . Chronic kidney disease 10/09/2016  . Essential hypertension 10/09/2016  . Gastroesophageal reflux disease 10/09/2016  . Restless leg syndrome 10/09/2016  . Malignant neoplasm of female breast ([place]) 10/09/2016  . History of breast cancer 10/09/2016  . Genital herpes simplex 10/09/2016    Past Surgical History:  Procedure Laterality Date  . ABDOMINAL HYSTERECTOMY    . BREAST SURGERY    . CESAREAN SECTION    . SMALL INTESTINE SURGERY       OB History   None      Home Medications    Prior to Admission medications   Medication Sig Start Date End Date Taking? Authorizing Provider  amLODipine (NORVASC) 10 MG tablet Take 10 mg by mouth daily.   Yes [provider]  aspirin 81 MG tablet Take 81 mg by mouth daily.   Yes [provider]  baclofen (LIORESAL) 10 MG tablet Take 1 tablet (10 mg total) by mouth 3 (three) times daily. 01/26/18  Yes Bo Merino, MD  Biotin 1 MG CAPS Take by mouth.   Yes [provider]  celecoxib  (CELEBREX) 200 MG capsule Take 1 capsule (200 mg total) by mouth daily. 11/30/17  Yes Ofilia Neas, PA-C  clonazePAM (KLONOPIN) 1 MG tablet TAKE 1 TABLET BY MOUTH AT BEDTIME AS NEEDED 12/28/17  Yes Ofilia Neas, PA-C  clonazePAM (KLONOPIN) 1 MG tablet TAKE 1 TABLET BY MOUTH AT BEDTIME AS NEEDED 02/25/18  Yes Ofilia Neas, PA-C  co-enzyme Q-10 30 MG capsule Take 30 mg by mouth daily.   Yes [provider]  diltiazem (CARDIZEM) 120 MG tablet Take 120 mg by mouth 2 (two) times daily with a meal.    Yes [provider]  ferrous sulfate 325 (65 FE) MG tablet Take 325 mg by mouth.   Yes [provider]  gabapentin (NEURONTIN) 300 MG capsule Take 300 mg by mouth every morning.    Yes [provider]  gabapentin (NEURONTIN) 800 MG tablet Take 1,200 mg by mouth at bedtime.    Yes [provider]  MAGNESIUM CHLORIDE ER PO Take by mouth.   Yes [provider]  omeprazole (PRILOSEC) 20 MG capsule Take 20 mg by mouth daily.   Yes [provider]  Pyridoxine HCl (VITAMIN B6 PO) Take by mouth.   Yes [provider]  Thiamine HCl (VITAMIN B-1 PO) Take by mouth.   Yes [provider]  traZODone (DESYREL) 150 MG tablet  Take by mouth.   Yes [provider]  valACYclovir (VALTREX) 500 MG tablet Take 500 mg by mouth 2 (two) times daily.   Yes [provider]  venlafaxine XR (EFFEXOR-XR) 150 MG 24 hr capsule TK ONE C PO QAM FOR DEPRESSION 02/16/17  Yes [provider]  vitamin B-12 (CYANOCOBALAMIN) 1000 MCG tablet Take by mouth.   Yes [provider]  fexofenadine-pseudoephedrine (ALLEGRA-D) 60-120 MG 12 hr tablet Take 1 tablet by mouth every 12 (twelve) hours. 04/01/18   Virgel Manifold, MD  fluticasone (FLONASE) 50 MCG/ACT nasal spray Place 1 spray into both nostrils daily. 04/01/18   Virgel Manifold, MD  hydrochlorothiazide (HYDRODIURIL) 25 MG tablet Take 25 mg by mouth daily.    [provider]    HYDROcodone-acetaminophen (VICODIN) 5-500 MG per tablet Take 1 tablet by mouth every 6 (six) hours as needed.    [provider]  labetalol (NORMODYNE) 100 MG tablet Take 100 mg by mouth 2 (two) times daily.    [provider]  LORazepam (ATIVAN) 0.5 MG tablet Take 0.5 mg by mouth every 8 (eight) hours.    [provider]  methocarbamol (ROBAXIN) 500 MG tablet Take 500 mg by mouth 2 (two) times daily.    [provider]  nitrofurantoin, macrocrystal-monohydrate, (MACROBID) 100 MG capsule Take 100 mg by mouth 2 (two) times daily. 11/27/17   [provider]  ONE TOUCH ULTRA TEST test strip U 1 STRIP TO AFFECTED AREA D 04/01/16   [provider]  phenazopyridine (PYRIDIUM) 200 MG tablet Take 200 mg by mouth 3 (three) times daily. 11/27/17   [provider]  tamoxifen (NOLVADEX) 10 MG tablet Take 10 mg by mouth 2 (two) times daily.    [provider]    Family History No family history on file.  Social History Social History   Tobacco Use  . Smoking status: Never Smoker  . Smokeless tobacco: Never Used  Substance Use Topics  . Alcohol use: Yes    Comment: occasional  . Drug use: Never     Allergies   Codeine; Penicillins; Sulfa antibiotics; Tramadol; Aromasin [exemestane]; Atorvastatin; Celexa [citalopram hydrobromide]; Citalopram; and Tamoxifen   Review of Systems Review of Systems All systems reviewed and negative, other than as noted in HPI.   Physical Exam Updated Vital Signs BP 135/89 (BP Location: Left Arm)   Pulse (!) 112   Temp 98.9 F (37.2 C)   Resp 19   Ht 6' (1.829 m)   Wt 95.3 kg   SpO2 100%   BMI 28.48 kg/m   Physical Exam  Constitutional: She appears well-developed and well-nourished. No distress.  HENT:  Head: Normocephalic and atraumatic.  Right Ear: External ear normal.  Left Ear: External ear normal.  Bilateral ear effusions.  Otherwise no signs of infection.  Eyes: Pupils are  equal, round, and reactive to light. Conjunctivae and EOM are normal. Right eye exhibits no discharge. Left eye exhibits no discharge.  Neck: Neck supple.  Cardiovascular: Normal rate, regular rhythm and normal heart sounds. Exam reveals no gallop and no friction rub.  No murmur heard. Pulmonary/Chest: Effort normal and breath sounds normal. No respiratory distress.  Abdominal: Soft. She exhibits no distension. There is no tenderness.  Musculoskeletal: She exhibits no edema or tenderness.  Neurological: She is alert.  Skin: Skin is warm and dry.  Psychiatric: She has a normal mood and affect. Her behavior is normal. Thought content normal.  Nursing note and vitals reviewed.  ED Treatments / Results  Labs (all labs ordered are listed, but only abnormal results are displayed) Labs Reviewed - No data to display  EKG None  Radiology No results found.  Procedures Procedures (including critical care time)  Medications Ordered in ED Medications - No data to display   Initial Impression / Assessment and Plan / ED Course  I have reviewed the triage vital signs and the nursing notes.  Pertinent labs & imaging results that were available during my care of the patient were reviewed by me and considered in my medical decision making (see chart for details).     67 year old female with symptoms consistent with viral URI.  No improvement with azithromycin.  Plan symptomatic treatment.  Return precautions are discussed.  Final Clinical Impressions(s) / ED Diagnoses   Final diagnoses:  Viral URI with cough  Fluid level behind tympanic membrane of both ears    ED Discharge Orders         Ordered    fluticasone (FLONASE) 50 MCG/ACT nasal spray  Daily     04/01/18 2029    fexofenadine-pseudoephedrine (ALLEGRA-D) 60-120 MG 12 hr tablet  Every 12 hours     04/01/18 2029    fluconazole (DIFLUCAN) 200 MG tablet  Every 3 DAYS     04/01/18 2029           Virgel Manifold,  MD 04/10/18 1913

## 2018-05-06 ENCOUNTER — Other Ambulatory Visit: Payer: Self-pay | Admitting: Rheumatology

## 2018-05-06 ENCOUNTER — Other Ambulatory Visit: Payer: Self-pay | Admitting: Physician Assistant

## 2018-05-06 NOTE — Telephone Encounter (Signed)
Last Visit: 11/30/17 Next Visit: 06/02/18  Last Fill: 02/25/18  Okay to refill Klonopin?

## 2018-05-06 NOTE — Telephone Encounter (Signed)
ok 

## 2018-05-07 ENCOUNTER — Telehealth: Payer: Self-pay | Admitting: Rheumatology

## 2018-05-07 NOTE — Telephone Encounter (Signed)
Patient left a voicemail requesting prescription refills of Baclofen and Clonazepam to be sent to [place].

## 2018-05-07 NOTE — Telephone Encounter (Signed)
Last Visit: 11/30/17 Next Visit: 06/02/18  Okay to refill per Dr. Estanislado Pandy

## 2018-05-07 NOTE — Telephone Encounter (Signed)
Prescription sent to the pharmacy.

## 2018-05-19 NOTE — Progress Notes (Signed)
Office Visit Note  Patient: Whitney Ward             Date of Birth: 1950-09-14           MRN: [id number]             PCP: Rocky Morel, MD Referring: Huey Romans* Visit Date: 06/02/2018 Occupation: @GUAROCC @  Subjective:  Generalized pain   History of Present Illness: Whitney Ward is a 67 y.o. female with history of fibromyalgia and osteoarthritis. She takes Celebrex 200 mg 1 tablet by mouth daily, Klonopin 1 mg 1 tablet by mouth at bedtime, Baclofen 10 mg TID PRN for muscle spasms. She is taking Trazodone 150 mg at bedtime for insomnia. Her fibromyalgia has been flaring for the past 3 months.  The pain has been worsening recently.  She has generalized muscle aches and muscle tenderness.  She has noticed some increased muscle weakness in bilateral lower extremities.  She denies any trochanteric bursitis at this time.  She has pain with ROM of extremities.  She has been having severe pain at night.  She states the fatigue has been worsening.  She has very interrupted sleep at night.  She has pain in both hands and feet.  She has been taking ibuprofen 200 mg 3-4 tablets by mouth daily.  She has not been taking Celebrex recently.    Activities of Daily Living:  Patient reports morning stiffness for 2  hours.   Patient Reports nocturnal pain.  Difficulty dressing/grooming: Denies Difficulty climbing stairs: Reports Difficulty getting out of chair: Reports Difficulty using hands for taps, buttons, cutlery, and/or writing: Reports  Review of Systems  Constitutional: Positive for fatigue.  HENT: Negative for mouth sores, mouth dryness and nose dryness.   Eyes: Negative for pain, visual disturbance and dryness.  Respiratory: Negative for cough, hemoptysis, shortness of breath and difficulty breathing.   Cardiovascular: Negative for chest pain, palpitations, hypertension and swelling in legs/feet.  Gastrointestinal: Positive for constipation. Negative for  blood in stool and diarrhea.  Endocrine: Negative for increased urination.  Genitourinary: Negative for painful urination.  Musculoskeletal: Positive for arthralgias, joint pain, myalgias, morning stiffness, muscle tenderness and myalgias. Negative for joint swelling and muscle weakness.  Skin: Negative for color change, pallor, rash, hair loss, nodules/bumps, skin tightness, ulcers and sensitivity to sunlight.  Allergic/Immunologic: Negative for susceptible to infections.  Neurological: Negative for dizziness, numbness, headaches and weakness.  Hematological: Negative for swollen glands.  Psychiatric/Behavioral: Positive for sleep disturbance. Negative for depressed mood. The patient is nervous/anxious.     PMFS History:  Patient Active Problem List   Diagnosis Date Noted  . Fibromyalgia 10/09/2016  . Primary insomnia 10/09/2016  . Other fatigue 10/09/2016  . Tendinopathy of right shoulder 10/09/2016  . Trochanteric bursitis of right hip 10/09/2016  . Primary osteoarthritis of both hands 10/09/2016  . Chronic kidney disease 10/09/2016  . Essential hypertension 10/09/2016  . Gastroesophageal reflux disease 10/09/2016  . Restless leg syndrome 10/09/2016  . Malignant neoplasm of female breast ([place]) 10/09/2016  . History of breast cancer 10/09/2016  . Genital herpes simplex 10/09/2016    Past Medical History:  Diagnosis Date  . Arthritis   . Breast cancer ([place])   . Breast cancer ([place])   . Broken toe   . Fibromyalgia   . Hypertension     History reviewed. No pertinent family history. Past Surgical History:  Procedure Laterality Date  . ABDOMINAL HYSTERECTOMY    . BREAST SURGERY    .  CESAREAN SECTION    . SMALL INTESTINE SURGERY     Social History   Social History Narrative  . Not on file    Objective: Vital Signs: BP (!) 149/84 (BP Location: Right Arm, Patient Position: Sitting, Cuff Size: Large)   Pulse 81   Resp 12   Ht 6' (1.829 m)   Wt 225 lb (102.1 kg)   BMI  30.52 kg/m    Physical Exam  Constitutional: She is oriented to person, place, and time. She appears well-developed and well-nourished.  HENT:  Head: Normocephalic and atraumatic.  Eyes: Conjunctivae and EOM are normal.  Neck: Normal range of motion.  Cardiovascular: Normal rate, regular rhythm, normal heart sounds and intact distal pulses.  Pulmonary/Chest: Effort normal and breath sounds normal.  Abdominal: Soft. Bowel sounds are normal.  Lymphadenopathy:    She has no cervical adenopathy.  Neurological: She is alert and oriented to person, place, and time.  Skin: Skin is warm and dry. Capillary refill takes less than 2 seconds.  Psychiatric: She has a normal mood and affect. Her behavior is normal.  Nursing note and vitals reviewed.    Musculoskeletal Exam: Generalized hyperalgesia on exam. Positive tender points. C-spine limited ROM with discomfort.  Thoracic and lumbar spine limited ROM with discomfort.  Shoulder joints, elbow joints, wrist joints, MCPs, PIPs, and DIPs good ROM with no synovitis. Complete fist formation.  Hip joints, knee joints, ankle joints, MTPs, PIPs, and DIPs good ROM with no synovitis.  No warmth or effusion of knee joints.  No tenderness or swelling of ankle joints.  No tenderness of trochanteric bursitis.    CDAI Exam: CDAI Score: Not documented Patient Global Assessment: Not documented; Provider Global Assessment: Not documented Swollen: Not documented; Tender: Not documented Joint Exam   Not documented   There is currently no information documented on the homunculus. Go to the Rheumatology activity and complete the homunculus joint exam.  Investigation: No additional findings.  Imaging: No results found.  Recent Labs: Lab Results  Component Value Date   WBC 4.5 11/30/2017   HGB 12.1 11/30/2017   PLT 268 11/30/2017   NA 141 11/30/2017   K 3.7 11/30/2017   CL 102 11/30/2017   CO2 31 11/30/2017   GLUCOSE 85 11/30/2017   BUN 19 11/30/2017     CREATININE 1.05 (H) 11/30/2017   BILITOT 0.6 11/30/2017   ALKPHOS 99 10/15/2016   AST 19 11/30/2017   ALT 13 11/30/2017   PROT 7.6 11/30/2017   ALBUMIN 4.4 10/15/2016   CALCIUM 9.8 11/30/2017   GFRAA 64 11/30/2017    Speciality Comments: No specialty comments available.  Procedures:  No procedures performed Allergies: Codeine; Penicillins; Sulfa antibiotics; Tramadol; Aromasin [exemestane]; Atorvastatin; Celexa [citalopram hydrobromide]; Citalopram; and Tamoxifen   Assessment / Plan:     Visit Diagnoses: Fibromyalgia -She has generalized hyperalgesia on exam.  She has positive tender points.  She has been having a fibromyalgia flare for the past 3 months.  Her generalized muscle aches and tenderness have been progressively worsening. She is taking Klonopin 1 mg 1 tablet by mouth at bedtime for RLS.  She takes Baclofen 10 mg TID PRN for muscle spasms.  She has been taking Ibuprofen 200 mg 3-4 tablets by mouth daily for pain relief.  She takes Celebrex very sparingly.  We discussed to not taking ibuprofen and celebrex.  She does not need any refills of her medications at this time.   She was encouraged to try to stay active and  exercise on a regular basis.  She has been unable to go to yoga due to the pain and fatigue she has been experiencing.  She has not been sleeping well at night due to the pain she has been experiencing.  She takes trazodone 150 mg at bedtime.  We discussed trying massage therapy and using a heating pad.  We also discussed going to PT.  A referral was placed today.  She will follow up in 6 months.- Plan: Ambulatory referral to Physical Therapy  Primary insomnia: She takes Trazodone 150 mg by mouth at bedtime.  She has very interrupted sleep at night due to the pain she has been experiencing.   Other fatigue: Chronic and related to insomnia.  She was encouraged to stay active and start exercising.  A referral to PT was placed today.   Tendinopathy of right shoulder -She  has good ROM with intermittent discomfort.  Plan: Ambulatory referral to Physical Therapy  Trochanteric bursitis of right hip - She has no tenderness on exam today.  She has intermittent tenderness.  A referral to PT was placed today. Plan: Ambulatory referral to Physical Therapy  Primary osteoarthritis of both hands: She has complete fist formation bilaterally.  She has been having pain in both hands.  No synovitis noted on exam. Joint protection and muscle strengthening discussed.   Other medical conditions are listed as follows:   Chronic kidney disease, unspecified CKD stage  Sacroiliac joint pain  History of gastroesophageal reflux (GERD)  History of hypertension  History of breast cancer  Restless leg syndrome   Orders: Orders Placed This Encounter  Procedures  . Ambulatory referral to Physical Therapy   No orders of the defined types were placed in this encounter.   Face-to-face time spent with patient was 30 minutes. Greater than 50% of time was spent in counseling and coordination of care.  Follow-Up Instructions: Return in about 6 months (around 12/02/2018) for Fibromyalgia, Osteoarthritis.   Ofilia Neas, PA-C  Note - This record has been created using Dragon software.  Chart creation errors have been sought, but may not always  have been located. Such creation errors do not reflect on  the standard of medical care.

## 2018-06-02 ENCOUNTER — Encounter: Payer: Self-pay | Admitting: Physician Assistant

## 2018-06-02 ENCOUNTER — Ambulatory Visit: Payer: BLUE CROSS/BLUE SHIELD | Admitting: Physician Assistant

## 2018-06-02 VITALS — BP 149/84 | HR 81 | Resp 12 | Ht 72.0 in | Wt 225.0 lb

## 2018-06-02 DIAGNOSIS — G2581 Restless legs syndrome: Secondary | ICD-10-CM

## 2018-06-02 DIAGNOSIS — M797 Fibromyalgia: Secondary | ICD-10-CM

## 2018-06-02 DIAGNOSIS — M19042 Primary osteoarthritis, left hand: Secondary | ICD-10-CM

## 2018-06-02 DIAGNOSIS — M533 Sacrococcygeal disorders, not elsewhere classified: Secondary | ICD-10-CM

## 2018-06-02 DIAGNOSIS — F5101 Primary insomnia: Secondary | ICD-10-CM

## 2018-06-02 DIAGNOSIS — Z8719 Personal history of other diseases of the digestive system: Secondary | ICD-10-CM

## 2018-06-02 DIAGNOSIS — M67911 Unspecified disorder of synovium and tendon, right shoulder: Secondary | ICD-10-CM

## 2018-06-02 DIAGNOSIS — R5383 Other fatigue: Secondary | ICD-10-CM | POA: Diagnosis not present

## 2018-06-02 DIAGNOSIS — M19041 Primary osteoarthritis, right hand: Secondary | ICD-10-CM

## 2018-06-02 DIAGNOSIS — M7061 Trochanteric bursitis, right hip: Secondary | ICD-10-CM

## 2018-06-02 DIAGNOSIS — Z853 Personal history of malignant neoplasm of breast: Secondary | ICD-10-CM

## 2018-06-02 DIAGNOSIS — Z8679 Personal history of other diseases of the circulatory system: Secondary | ICD-10-CM

## 2018-06-02 DIAGNOSIS — N189 Chronic kidney disease, unspecified: Secondary | ICD-10-CM

## 2018-06-09 ENCOUNTER — Ambulatory Visit: Payer: BLUE CROSS/BLUE SHIELD | Attending: Physician Assistant | Admitting: Physical Therapy

## 2018-06-09 ENCOUNTER — Encounter: Payer: Self-pay | Admitting: Physical Therapy

## 2018-06-09 DIAGNOSIS — M6281 Muscle weakness (generalized): Secondary | ICD-10-CM

## 2018-06-09 DIAGNOSIS — M25511 Pain in right shoulder: Secondary | ICD-10-CM | POA: Insufficient documentation

## 2018-06-09 DIAGNOSIS — M25551 Pain in right hip: Secondary | ICD-10-CM | POA: Diagnosis present

## 2018-06-09 DIAGNOSIS — G8929 Other chronic pain: Secondary | ICD-10-CM | POA: Insufficient documentation

## 2018-06-09 NOTE — Patient Instructions (Signed)
Access Code: [email or web address]  URL: [email or web address]  Date: 06/09/2018  Prepared by: Elsie Ra   Exercises  Supine Hamstring Stretch with Strap - 3 sets - 30 hold - 2x daily - 6x weekly  Supine Piriformis Stretch with Foot on Ground - 3 sets - 30 hold - 2x daily - 6x weekly  Clamshell - 10 reps - 3 sets - 2x daily - 6x weekly  Standing Shoulder Flexion Wall Slide - 10 reps - 3 sets - 2x daily - 6x weekly  Seated Scapular Retraction - 10 reps - 2-3 sets - 5 sec hold - 2x daily - 6x weekly  Standing Isometric Shoulder Abduction with Doorway - 10 reps - 1 sets - 5 hold - 2x daily - 6x weekly

## 2018-06-10 NOTE — Therapy (Signed)
[facility] [address]  [place], [place], [address] Phone: [phone]   Fax:  [phone]  Physical Therapy Evaluation  Patient Details  Name: Whitney Ward MRN: [id number] Date of Birth: 1951/04/01 Referring Provider (PT): Hazel Sams, PA-C   Encounter Date: 06/09/2018  PT End of Session - 06/09/18 2359    Visit Number  1    Number of Visits  12    Date for PT Re-Evaluation  07/21/18    Authorization Type  [employer] 30 VL    Authorization - Visit Number  1    Authorization - Number of Visits  30    PT Start Time  7371    PT Stop Time  1755    PT Time Calculation (min)  45 min    Activity Tolerance  Patient tolerated treatment well    Behavior During Therapy  [facility] for tasks assessed/performed       Past Medical History:  Diagnosis Date  . Arthritis   . Breast cancer ([place])   . Breast cancer ([place])   . Broken toe   . Fibromyalgia   . Hypertension     Past Surgical History:  Procedure Laterality Date  . ABDOMINAL HYSTERECTOMY    . BREAST SURGERY    . CESAREAN SECTION    . SMALL INTESTINE SURGERY      There were no vitals filed for this visit.   Subjective Assessment - 06/09/18 2353    Subjective  Pt relays chronic widespread pain due to fibromyalgia, but flare up 4 months ago that is progressing now to Rt shoulder pain and Rt hip pain along with back and bilat leg pain.     Limitations  Sitting;Lifting;Standing;Walking    How long can you sit comfortably?  1 hour    How long can you stand comfortably?  30 min    How long can you walk comfortably?  30 min    Diagnostic tests  nothing recent    Patient Stated Goals  reduce pain    Currently in Pain?  Yes    Pain Score  5     Pain Location  Hip    Pain Orientation  Right    Pain Descriptors / Indicators  Tightness;Aching    Pain Type  Chronic pain    Pain Radiating Towards  from back down to foot    Pain Onset  More than a month ago    Pain  Frequency  Constant    Aggravating Factors   prolonged standing, walking, sitting, stairs    Pain Relieving Factors  NSAIDs, heat, rest    Multiple Pain Sites  Yes    Pain Score  5    Pain Location  Shoulder    Pain Orientation  Right    Pain Descriptors / Indicators  Aching;Tightness    Pain Type  Chronic pain    Pain Radiating Towards  denies    Pain Onset  More than a month ago    Pain Frequency  Constant    Aggravating Factors   lifitng, carrying, reaching    Pain Relieving Factors  rest, heat         [facility] PT Assessment - 06/09/18 0001      Assessment   Medical Diagnosis  Pain in right shoulder, Pain in right hip, fibromyalgia    Referring Provider (PT)  Hazel Sams, PA-C    Onset Date/Surgical Date  --   chronic but  worse over last 4 months   Hand Dominance  Right    Next MD Visit  --   not for 6 months   Prior Therapy  --   yes for ankle     Precautions   Precautions  None      Restrictions   Weight Bearing Restrictions  No      Balance Screen   Has the patient fallen in the past 6 months  Yes    How many times?  1    Has the patient had a decrease in activity level because of a fear of falling?   No    Is the patient reluctant to leave their home because of a fear of falling?   No      Home Film/video editor residence    Additional Comments  2 levels handrail on Rt      Prior Function   Level of Independence  Independent with basic ADLs    Vocation  Full time employment    Vocation Requirements  sitting work as Merchandiser, retail    Leisure  reading, walking would like to return to yoga and walking 3 miles      Cognition   Overall Cognitive Status  Within Functional Limits for tasks assessed      Observation/Other Assessments   Focus on Therapeutic Outcomes (FOTO)   not done, multiple Diagnosis      Sensation   Light Touch  Appears Intact      ROM / Strength   AROM / PROM / Strength  AROM;PROM;Strength      AROM   Overall  AROM Comments  Rt hip limited to 90 deg flexion    AROM Assessment Site  Shoulder;Lumbar    Right/Left Shoulder  Right    Right Shoulder Extension  --   Wfl   Right Shoulder Flexion  110 Degrees    Right Shoulder ABduction  110 Degrees    Right Shoulder Internal Rotation  --   WFL   Right Shoulder External Rotation  --   WFL   Lumbar Flexion  75%    Lumbar Extension  50%    Lumbar - Right Side Bend  50%    Lumbar - Left Side Bend  50%    Lumbar - Right Rotation  50%    Lumbar - Left Rotation  50%      PROM   Overall PROM Comments  WFL      Strength   Overall Strength Comments  LE strength 4/5 MMT grossly tested in sitting except knee flexion 5/5 bilat    Strength Assessment Site  Shoulder    Right/Left Shoulder  Right    Right Shoulder Flexion  4/5    Right Shoulder ABduction  4-/5    Right Shoulder Internal Rotation  4+/5    Right Shoulder External Rotation  4+/5      Flexibility   Soft Tissue Assessment /Muscle Length  --   tight H.S, paraspinals     Palpation   Palpation comment  widespread pain, very tender to light touch in legs, back, shoulders      Special Tests   Other special tests  + impingment tests Rt shoulder, all lumbar, SI joint, hip special testing all + for pain so inconclusive      Ambulation/Gait   Gait Comments  slow velocity, decreased step length and stance time  Objective measurements completed on examination: See above findings.      [facility] Adult PT Treatment/Exercise - 06/09/18 0001      Modalities   Modalities  Moist Heat;Electrical Stimulation      Moist Heat Therapy   Number Minutes Moist Heat  10 Minutes    Moist Heat Location  Hip;Shoulder      Electrical Stimulation   Electrical Stimulation Location  Rt shoulder, Rt hip    Electrical Stimulation Action  pre mod    Electrical Stimulation Parameters  tolerance    Electrical Stimulation Goals  Pain             PT Education - 06/09/18 2359     Education Details  HEP,POC,TENS    Person(s) Educated  Patient    Methods  Explanation;Demonstration;Verbal cues;Handout    Comprehension  Verbalized understanding;Need further instruction          PT Long Term Goals - 06/10/18 0006      PT LONG TERM GOAL #1   Title  Pt will be I and compliant with HEP. 6 weeks 07/21/18    Status  New      PT LONG TERM GOAL #2   Title  Pt will improve hip, lumbar, shoulder ROM to [facility]. 6 weeks 07/21/18    Status  New      PT LONG TERM GOAL #3   Title  Pt will be able to ambulate community distances and go up/down 2 flights of stairs with less than 3/10 pain overall. 6 weeks 07/21/18      PT LONG TERM GOAL #4   Title  Pt will improve LE and UE strength to overall 4+/5 grossly. 6 weeks 07/21/18    Status  New             Plan - 06/10/18 0001    Clinical Impression Statement  Pt presents with widespread pain due to fibromyalgia with worsening Rt hip and shoulder pain which presents more muscular in nature. All special testing caused pain and likely false + therefore inconclusive She has has decreased ROM, decreased strength, poor activity tolerance, and incresed pain limiting her function. She will benefit from skilled PT to address her deficits. She will also benefit from pain education.    History and Personal Factors relevant to plan of care:  co morbididies and chronic pain    Clinical Presentation  Evolving    Clinical Presentation due to:  worsening over last 4 months    Clinical Decision Making  Moderate    Rehab Potential  Fair    Clinical Impairments Affecting Rehab Potential  chronic conditions    PT Frequency  2x / week    PT Duration  6 weeks    PT Treatment/Interventions  Cryotherapy;Electrical Stimulation;Iontophoresis 4mg /ml Dexamethasone;Moist Heat;Traction;Ultrasound;Gait training;Stair training;Therapeutic activities;Therapeutic exercise;Neuromuscular re-education;Manual techniques;Passive range of motion;Dry  needling;Taping;Joint Manipulations;Spinal Manipulations    PT Next Visit Plan  review HEP, consider functional testing of TUG or 5TSTS or 6MWT. Graded exercise, ROM and strength as tolerated, consider modalites     Consulted and Agree with Plan of Care  Patient       Patient will benefit from skilled therapeutic intervention in order to improve the following deficits and impairments:  Abnormal gait, Decreased activity tolerance, Decreased endurance, Decreased range of motion, Decreased strength, Increased fascial restricitons, Increased muscle spasms, Impaired flexibility, Improper body mechanics, Postural dysfunction, Pain  Visit Diagnosis: Chronic right shoulder pain  Pain in right hip  Muscle weakness (generalized)  Problem List Patient Active Problem List   Diagnosis Date Noted  . Fibromyalgia 10/09/2016  . Primary insomnia 10/09/2016  . Other fatigue 10/09/2016  . Tendinopathy of right shoulder 10/09/2016  . Trochanteric bursitis of right hip 10/09/2016  . Primary osteoarthritis of both hands 10/09/2016  . Chronic kidney disease 10/09/2016  . Essential hypertension 10/09/2016  . Gastroesophageal reflux disease 10/09/2016  . Restless leg syndrome 10/09/2016  . Malignant neoplasm of female breast ([place]) 10/09/2016  . History of breast cancer 10/09/2016  . Genital herpes simplex 10/09/2016    Debbe Odea, PT,DPT 06/10/2018, 12:09 AM  [facility] [address]  [place], [place], [address] Phone: [phone]   Fax:  [phone]  Name: PARTICIA STRAHM MRN: [id number] Date of Birth: [birth year]

## 2018-06-14 ENCOUNTER — Ambulatory Visit: Payer: BLUE CROSS/BLUE SHIELD | Admitting: Physical Therapy

## 2018-06-14 ENCOUNTER — Encounter: Payer: Self-pay | Admitting: Physical Therapy

## 2018-06-14 DIAGNOSIS — M25511 Pain in right shoulder: Secondary | ICD-10-CM | POA: Diagnosis not present

## 2018-06-14 DIAGNOSIS — M6281 Muscle weakness (generalized): Secondary | ICD-10-CM

## 2018-06-14 DIAGNOSIS — M25551 Pain in right hip: Secondary | ICD-10-CM

## 2018-06-14 DIAGNOSIS — G8929 Other chronic pain: Secondary | ICD-10-CM

## 2018-06-14 NOTE — Therapy (Signed)
[facility] [address]  [place], [place], [address] Phone: [phone]   Fax:  [phone]  Physical Therapy Treatment  Patient Details  Name: Whitney Ward MRN: [id number] Date of Birth: 1951-06-14 Referring Provider (PT): Hazel Sams, PA-C   Encounter Date: 06/14/2018  PT End of Session - 06/14/18 1736    Visit Number  2    Number of Visits  12    Date for PT Re-Evaluation  07/21/18    Authorization Type  BCBS 30 VL    Authorization - Visit Number  1    Authorization - Number of Visits  30    PT Start Time  1700    PT Stop Time  1751   moist heat   PT Time Calculation (min)  51 min    Activity Tolerance  Patient tolerated treatment well    Behavior During Therapy  [facility] for tasks assessed/performed       Past Medical History:  Diagnosis Date  . Arthritis   . Breast cancer ([place])   . Breast cancer ([place])   . Broken toe   . Fibromyalgia   . Hypertension     Past Surgical History:  Procedure Laterality Date  . ABDOMINAL HYSTERECTOMY    . BREAST SURGERY    . CESAREAN SECTION    . SMALL INTESTINE SURGERY      There were no vitals filed for this visit.  Subjective Assessment - 06/14/18 1702    Subjective  Reports she had 10/10 pain in L hip after last session and took some Aleve, but it did not help. Tried to perform HEP but was having a lot of pain.     Diagnostic tests  nothing recent    Patient Stated Goals  reduce pain    Currently in Pain?  Yes    Pain Score  3     Pain Location  Hip    Pain Orientation  Right;Left;Lateral    Pain Descriptors / Indicators  Aching    Pain Type  Chronic pain    Pain Score  4    Pain Location  Shoulder    Pain Orientation  Left    Pain Descriptors / Indicators  Throbbing    Pain Type  Chronic pain                       [facility] Adult PT Treatment/Exercise - 06/14/18 0001      Exercises   Exercises  Neck;Knee/Hip      Neck Exercises:  Seated   Other Seated Exercise  B shoulder retraction 10x3"      Knee/Hip Exercises: Stretches   Passive Hamstring Stretch  Right;Left;1 rep;30 seconds;Limitations    Passive Hamstring Stretch Limitations  supine strap    Piriformis Stretch  Right;Left;1 rep;30 seconds    Other Knee/Hip Stretches  figure 4 stretch 2x30" each LE to tolerance    Other Knee/Hip Stretches  SKTC x30" each LE      Knee/Hip Exercises: Aerobic   Nustep  L1 x 6 min UEs/LEs      Knee/Hip Exercises: Seated   Sit to Sand  1 set;5 reps;without UE support   cues for set up and glute activation     Knee/Hip Exercises: Supine   Bridges  Strengthening;Both;1 set;10 reps    Bridges Limitations  cues for core contraction    Other Supine Knee/Hip Exercises  LTR x20 within very limited range  to avoid pain      Knee/Hip Exercises: Sidelying   Clams  10x each LE   cues to avoid trunk rotation   Other Sidelying Knee/Hip Exercises  open book stretch 10x each side   cues to follow hand with head to promote rotation     Moist Heat Therapy   Number Minutes Moist Heat  10 Minutes    Moist Heat Location  Shoulder;Hip   L hip and shoulder            PT Education - 06/14/18 1735    Education Details  update to HEP, edu on benefits of low intensity aerobic exercise on fibromyalgia    Person(s) Educated  Patient    Methods  Explanation;Demonstration;Tactile cues;Verbal cues;Handout    Comprehension  Verbalized understanding;Returned demonstration          PT Long Term Goals - 06/14/18 1745      PT LONG TERM GOAL #1   Title  Pt will be I and compliant with HEP. 6 weeks 07/21/18    Status  On-going      PT LONG TERM GOAL #2   Title  Pt will improve hip, lumbar, shoulder ROM to [facility]. 6 weeks 07/21/18    Status  On-going      PT LONG TERM GOAL #3   Title  Pt will be able to ambulate community distances and go up/down 2 flights of stairs with less than 3/10 pain overall. 6 weeks 07/21/18    Status  On-going       PT LONG TERM GOAL #4   Title  Pt will improve LE and UE strength to overall 4+/5 grossly. 6 weeks 07/21/18    Status  On-going            Plan - 06/14/18 1744    Clinical Impression Statement  Patient arrived to session with report of 10/10 pain in L hip after last session- tried to take Aleve but it did not help. Reports she tried to perform HEP but was limited by widespread pain. Worked on introducing gentle hip strengthening and LE stretching to patient today while monitoring symptoms for pain. L hip and shoulder were patient's main complaints today. Addressed B LEs and UEs with ther-ex today. Patient reporting discomfort with L piriformis KTOS stretch, better tolerance of figure 4 stretch. Updated HEP to omit KTOS and add in figure 4 to tolerance. Able to perform bridge within limited range with cues to contract core. Patient required cues to correction trunk rotation with clamshell- good carryover of cues demonstrated. Ended session with moist heat to L shoulder and hip as patient requested this modality. Ended session with no complaints.     Clinical Impairments Affecting Rehab Potential  chronic conditions    PT Treatment/Interventions  Cryotherapy;Electrical Stimulation;Iontophoresis 4mg /ml Dexamethasone;Moist Heat;Traction;Ultrasound;Gait training;Stair training;Therapeutic activities;Therapeutic exercise;Neuromuscular re-education;Manual techniques;Passive range of motion;Dry needling;Taping;Joint Manipulations;Spinal Manipulations    PT Next Visit Plan  assess response to today's session, gentle ROM and strengthening to tol    Consulted and Agree with Plan of Care  Patient       Patient will benefit from skilled therapeutic intervention in order to improve the following deficits and impairments:  Abnormal gait, Decreased activity tolerance, Decreased endurance, Decreased range of motion, Decreased strength, Increased fascial restricitons, Increased muscle spasms, Impaired  flexibility, Improper body mechanics, Postural dysfunction, Pain  Visit Diagnosis: Chronic right shoulder pain  Pain in right hip  Muscle weakness (generalized)     Problem List Patient Active  Problem List   Diagnosis Date Noted  . Fibromyalgia 10/09/2016  . Primary insomnia 10/09/2016  . Other fatigue 10/09/2016  . Tendinopathy of right shoulder 10/09/2016  . Trochanteric bursitis of right hip 10/09/2016  . Primary osteoarthritis of both hands 10/09/2016  . Chronic kidney disease 10/09/2016  . Essential hypertension 10/09/2016  . Gastroesophageal reflux disease 10/09/2016  . Restless leg syndrome 10/09/2016  . Malignant neoplasm of female breast ([place]) 10/09/2016  . History of breast cancer 10/09/2016  . Genital herpes simplex 10/09/2016    Janene Harvey, PT, DPT 06/14/18 5:57 PM   [facility] [address]  [place], [place], [address] Phone: [phone]   Fax:  [phone]  Name: KEILANA MORLOCK MRN: [id number] Date of Birth: [birth year]

## 2018-06-17 ENCOUNTER — Encounter: Payer: BLUE CROSS/BLUE SHIELD | Admitting: Physical Therapy

## 2018-06-28 ENCOUNTER — Ambulatory Visit: Payer: BLUE CROSS/BLUE SHIELD | Admitting: Physical Therapy

## 2018-06-28 ENCOUNTER — Encounter: Payer: Self-pay | Admitting: Physical Therapy

## 2018-06-28 DIAGNOSIS — M25551 Pain in right hip: Secondary | ICD-10-CM

## 2018-06-28 DIAGNOSIS — M25511 Pain in right shoulder: Secondary | ICD-10-CM | POA: Diagnosis not present

## 2018-06-28 DIAGNOSIS — M6281 Muscle weakness (generalized): Secondary | ICD-10-CM

## 2018-06-28 DIAGNOSIS — G8929 Other chronic pain: Secondary | ICD-10-CM

## 2018-06-28 NOTE — Therapy (Signed)
[facility] [address]  [place], [place], [address] Phone: [phone]   Fax:  [phone]  Physical Therapy Treatment  Patient Details  Name: Whitney Ward MRN: [id number] Date of Birth: October 10, 1950 Referring Provider (PT): Hazel Sams, PA-C   Encounter Date: 06/28/2018  PT End of Session - 06/28/18 1708    Visit Number  3    Number of Visits  12    Date for PT Re-Evaluation  07/21/18    Authorization Type  BCBS 30 VL    Authorization - Visit Number  3    Authorization - Number of Visits  30    PT Start Time  5329   pt arrived late   PT Stop Time  1746    PT Time Calculation (min)  38 min    Activity Tolerance  Patient tolerated treatment well    Behavior During Therapy  [facility] for tasks assessed/performed       Past Medical History:  Diagnosis Date  . Arthritis   . Breast cancer ([place])   . Breast cancer ([place])   . Broken toe   . Fibromyalgia   . Hypertension     Past Surgical History:  Procedure Laterality Date  . ABDOMINAL HYSTERECTOMY    . BREAST SURGERY    . CESAREAN SECTION    . SMALL INTESTINE SURGERY      There were no vitals filed for this visit.  Subjective Assessment - 06/28/18 1712    Subjective  Reports she has not been able to do any of her HEP as this causes her pain to flare-up. States pain can get up to 10/10 with sit to stand transition. Pain also bad with in/out of bed transtions.    Diagnostic tests  nothing recent    Patient Stated Goals  reduce pain    Currently in Pain?  Yes    Pain Score  --   2-3/10   Pain Location  Leg    Pain Orientation  Right;Left;Anterior    Pain Descriptors / Indicators  Burning   "stretching, pulling"   Pain Type  Chronic pain    Pain Frequency  Constant    Multiple Pain Sites  No                       [facility] Adult PT Treatment/Exercise - 06/28/18 1708      Lumbar Exercises: Supine   Ab Set  10 reps;5 seconds    AB  Set Limitations  core activation - pelvic floor + abd bracing    Clam  10 reps;3 seconds    Clam Limitations  abd bracing + alt bent knee fall-out    Dead Bug  10 reps;3 seconds    Dead Bug Limitations  cues for abd bracing & ROM within painfree range    Other Supine Lumbar Exercises  Hookying abd bracing + alt shoulder flexion within painfree range 10 x 3"    Other Supine Lumbar Exercises  Hooklying abd bracing + alt LE extension 10 x 3"      Knee/Hip Exercises: Aerobic   Nustep  L2 x 6 min (UEs/LEs)      Knee/Hip Exercises: Sidelying   Other Sidelying Knee/Hip Exercises  open book stretch 10x each side   cues to follow hand with head to promote rotation     Shoulder Exercises: Supine   Other Supine Exercises  Hooklying abdominal bracing + isometric scapular retraction  into mat table 10 x 3"             PT Education - 06/28/18 1745    Education Details  HEP - basic core activation/strengthening    Person(s) Educated  Patient    Methods  Explanation;Demonstration;Handout    Comprehension  Verbalized understanding;Returned demonstration          PT Long Term Goals - 06/14/18 1745      PT LONG TERM GOAL #1   Title  Pt will be I and compliant with HEP. 6 weeks 07/21/18    Status  On-going      PT LONG TERM GOAL #2   Title  Pt will improve hip, lumbar, shoulder ROM to [facility]. 6 weeks 07/21/18    Status  On-going      PT LONG TERM GOAL #3   Title  Pt will be able to ambulate community distances and go up/down 2 flights of stairs with less than 3/10 pain overall. 6 weeks 07/21/18    Status  On-going      PT LONG TERM GOAL #4   Title  Pt will improve LE and UE strength to overall 4+/5 grossly. 6 weeks 07/21/18    Status  On-going            Plan - 06/28/18 1714    Clinical Impression Statement  Patient reporting she has been unable to do any of her HEP as all of the exercises cause her pain to flare up. As such, regressed exercises to basic supine level core  activation and gentle ROM exercises with pt reporting good tolerance - updated HEP provided based on these exercises with pt instucted to also complete any of original HEP exercises that do not exacerbate her pain..    Rehab Potential  Fair    Clinical Impairments Affecting Rehab Potential  chronic conditions    PT Treatment/Interventions  Cryotherapy;Electrical Stimulation;Iontophoresis 4mg /ml Dexamethasone;Moist Heat;Traction;Ultrasound;Gait training;Stair training;Therapeutic activities;Therapeutic exercise;Neuromuscular re-education;Manual techniques;Passive range of motion;Dry needling;Taping;Joint Manipulations;Spinal Manipulations    PT Next Visit Plan  assess response to updated HEP, gentle ROM and strengthening to tol    Consulted and Agree with Plan of Care  Patient       Patient will benefit from skilled therapeutic intervention in order to improve the following deficits and impairments:  Abnormal gait, Decreased activity tolerance, Decreased endurance, Decreased range of motion, Decreased strength, Increased fascial restricitons, Increased muscle spasms, Impaired flexibility, Improper body mechanics, Postural dysfunction, Pain  Visit Diagnosis: Chronic right shoulder pain  Pain in right hip  Muscle weakness (generalized)     Problem List Patient Active Problem List   Diagnosis Date Noted  . Fibromyalgia 10/09/2016  . Primary insomnia 10/09/2016  . Other fatigue 10/09/2016  . Tendinopathy of right shoulder 10/09/2016  . Trochanteric bursitis of right hip 10/09/2016  . Primary osteoarthritis of both hands 10/09/2016  . Chronic kidney disease 10/09/2016  . Essential hypertension 10/09/2016  . Gastroesophageal reflux disease 10/09/2016  . Restless leg syndrome 10/09/2016  . Malignant neoplasm of female breast ([place]) 10/09/2016  . History of breast cancer 10/09/2016  . Genital herpes simplex 10/09/2016    Percival Spanish, PT, MPT 06/28/2018, 5:57 PM  [facility] [address]  [place], [place], [address] Phone: [phone]   Fax:  [phone]  Name: Whitney Ward MRN: [id number] Date of Birth: 08/24/1950

## 2018-07-01 ENCOUNTER — Encounter: Payer: Self-pay | Admitting: Physical Therapy

## 2018-07-01 ENCOUNTER — Ambulatory Visit: Payer: BLUE CROSS/BLUE SHIELD | Attending: Physician Assistant | Admitting: Physical Therapy

## 2018-07-01 DIAGNOSIS — M25511 Pain in right shoulder: Secondary | ICD-10-CM | POA: Insufficient documentation

## 2018-07-01 DIAGNOSIS — M6281 Muscle weakness (generalized): Secondary | ICD-10-CM | POA: Insufficient documentation

## 2018-07-01 DIAGNOSIS — G8929 Other chronic pain: Secondary | ICD-10-CM | POA: Insufficient documentation

## 2018-07-01 DIAGNOSIS — M25551 Pain in right hip: Secondary | ICD-10-CM | POA: Insufficient documentation

## 2018-07-01 NOTE — Therapy (Signed)
[facility] [address]  [place], [place], [address] Phone: [phone]   Fax:  [phone]  Physical Therapy Treatment  Patient Details  Name: Whitney Ward MRN: [id number] Date of Birth: January 22, 1951 Referring Provider (PT): Hazel Sams, PA-C   Encounter Date: 07/01/2018  PT End of Session - 07/01/18 1705    Visit Number  4    Number of Visits  12    Date for PT Re-Evaluation  07/21/18    Authorization Type  BCBS 30 VL    Authorization - Visit Number  3    Authorization - Number of Visits  30    PT Start Time  8182    PT Stop Time  1754    PT Time Calculation (min)  49 min    Activity Tolerance  Patient tolerated treatment well    Behavior During Therapy  [facility] for tasks assessed/performed       Past Medical History:  Diagnosis Date  . Arthritis   . Breast cancer ([place])   . Breast cancer ([place])   . Broken toe   . Fibromyalgia   . Hypertension     Past Surgical History:  Procedure Laterality Date  . ABDOMINAL HYSTERECTOMY    . BREAST SURGERY    . CESAREAN SECTION    . SMALL INTESTINE SURGERY      There were no vitals filed for this visit.  Subjective Assessment - 07/01/18 1708    Subjective  Pt reporting that she has been better able to complete the latest HEP at home - still having some increased pain afterward, but able to complete the exercises. Pain still up to 10/10 at times.    Diagnostic tests  nothing recent    Patient Stated Goals  reduce pain    Currently in Pain?  Yes    Pain Score  1     Pain Location  Leg    Pain Descriptors / Indicators  Aching;Sharp;Burning    Pain Type  Chronic pain    Pain Frequency  Constant    Multiple Pain Sites  Yes    Pain Score  2    Pain Location  Shoulder    Pain Orientation  Right;Left   R > L   Pain Descriptors / Indicators  Aching;Sharp;Burning   "it just hurts"   Pain Frequency  Constant                       [facility] Adult  PT Treatment/Exercise - 07/01/18 1705      Lumbar Exercises: Seated   Hip Flexion on Ball  Both;10 reps    Hip Flexion on Ball Limitations  on edge of mat table      Lumbar Exercises: Supine   Clam  10 reps;3 seconds    Clam Limitations  abd bracing + alt hip ABD/ER with yellow TB    Bent Knee Raise  10 reps;3 seconds    Bent Knee Raise Limitations  brace marching with yellow TB at knees    Dead Bug  10 reps;3 seconds    Dead Bug Limitations  cues for abd bracing     Large Ball Oblique Isometric  10 reps;3 seconds    Large Ball Oblique Isometric Limitations  orange Pball      Knee/Hip Exercises: Aerobic   Nustep  L2 x 6 min (UEs/LEs)      Knee/Hip Exercises: Supine   Bridges  Both;10  reps;Strengthening   3 sec hold   Bridges Limitations  cues for core contraction      Shoulder Exercises: Supine   Horizontal ABduction  Both;10 reps;Theraband;Strengthening    Theraband Level (Shoulder Horizontal ABduction)  Level 1 (Yellow)    Horizontal ABduction Limitations  hooklying + scap retraction    External Rotation  Both;10 reps;Theraband;Strengthening    Theraband Level (Shoulder External Rotation)  Level 1 (Yellow)    External Rotation Limitations  hooklying + scap retraction    Other Supine Exercises  Hooklying abdominal bracing + isometric scapular retraction into mat table 10 x 3"      Shoulder Exercises: Seated   Extension  Both;5 reps;Theraband;Strengthening    Theraband Level (Shoulder Extension)  Level 1 (Yellow)    Extension Limitations  cues for scap retraction; stopped d/t c/o pain in L lateral shoulder    Row  Both;10 reps;Theraband;Strengthening    Theraband Level (Shoulder Row)  Level 1 (Yellow)    Row Limitations  cues for scap retraction      Modalities   Modalities  Moist Heat      Moist Heat Therapy   Number Minutes Moist Heat  10 Minutes    Moist Heat Location  Shoulder;Hip   L hip & B shoulder                 PT Long Term Goals - 06/14/18 1745       PT LONG TERM GOAL #1   Title  Pt will be I and compliant with HEP. 6 weeks 07/21/18    Status  On-going      PT LONG TERM GOAL #2   Title  Pt will improve hip, lumbar, shoulder ROM to [facility]. 6 weeks 07/21/18    Status  On-going      PT LONG TERM GOAL #3   Title  Pt will be able to ambulate community distances and go up/down 2 flights of stairs with less than 3/10 pain overall. 6 weeks 07/21/18    Status  On-going      PT LONG TERM GOAL #4   Title  Pt will improve LE and UE strength to overall 4+/5 grossly. 6 weeks 07/21/18    Status  On-going            Plan - 07/01/18 1747    Clinical Impression Statement  Whitney Ward reporting better tolerance for revised HEP - still noting some increased pain but able to complete all exercises. Slight resistance with yellow TB added to some exercises during therapy session today, with good tolerance for all exercises except seated shoulder extension d/t sharp pain in L shoulder - small TP noted in L posterior/lateral deltoid which resolved with STM and light pressure manual TPR. Pt requesting moist heat to B shoulders and L hip at end of session today.    Rehab Potential  Fair    Clinical Impairments Affecting Rehab Potential  chronic conditions    PT Treatment/Interventions  Cryotherapy;Electrical Stimulation;Iontophoresis 4mg /ml Dexamethasone;Moist Heat;Traction;Ultrasound;Gait training;Stair training;Therapeutic activities;Therapeutic exercise;Neuromuscular re-education;Manual techniques;Passive range of motion;Dry needling;Taping;Joint Manipulations;Spinal Manipulations    PT Next Visit Plan  gentle ROM and strengthening to tol; gradually progress HEP as tolerated    Consulted and Agree with Plan of Care  Patient       Patient will benefit from skilled therapeutic intervention in order to improve the following deficits and impairments:  Abnormal gait, Decreased activity tolerance, Decreased endurance, Decreased range of motion, Decreased  strength, Increased fascial restricitons, Increased muscle  spasms, Impaired flexibility, Improper body mechanics, Postural dysfunction, Pain  Visit Diagnosis: Chronic right shoulder pain  Pain in right hip  Muscle weakness (generalized)     Problem List Patient Active Problem List   Diagnosis Date Noted  . Fibromyalgia 10/09/2016  . Primary insomnia 10/09/2016  . Other fatigue 10/09/2016  . Tendinopathy of right shoulder 10/09/2016  . Trochanteric bursitis of right hip 10/09/2016  . Primary osteoarthritis of both hands 10/09/2016  . Chronic kidney disease 10/09/2016  . Essential hypertension 10/09/2016  . Gastroesophageal reflux disease 10/09/2016  . Restless leg syndrome 10/09/2016  . Malignant neoplasm of female breast ([place]) 10/09/2016  . History of breast cancer 10/09/2016  . Genital herpes simplex 10/09/2016    Percival Spanish, PT, MPT 07/01/2018, 6:02 PM  [facility] [address]  [place], [place], [address] Phone: [phone]   Fax:  [phone]  Name: Whitney Ward MRN: [id number] Date of Birth: 22-Sep-1950

## 2018-07-05 ENCOUNTER — Ambulatory Visit: Payer: BLUE CROSS/BLUE SHIELD | Admitting: Physical Therapy

## 2018-07-08 ENCOUNTER — Encounter: Payer: BLUE CROSS/BLUE SHIELD | Admitting: Physical Therapy

## 2018-07-12 ENCOUNTER — Ambulatory Visit: Payer: BLUE CROSS/BLUE SHIELD | Admitting: Physical Therapy

## 2018-07-12 ENCOUNTER — Encounter: Payer: Self-pay | Admitting: Physical Therapy

## 2018-07-12 DIAGNOSIS — M25511 Pain in right shoulder: Secondary | ICD-10-CM | POA: Diagnosis not present

## 2018-07-12 DIAGNOSIS — G8929 Other chronic pain: Secondary | ICD-10-CM

## 2018-07-12 DIAGNOSIS — M6281 Muscle weakness (generalized): Secondary | ICD-10-CM

## 2018-07-12 DIAGNOSIS — M25551 Pain in right hip: Secondary | ICD-10-CM

## 2018-07-12 NOTE — Therapy (Signed)
[facility] [address]  [place], [place], [address] Phone: [phone]   Fax:  [phone]  Physical Therapy Treatment  Patient Details  Name: Whitney Ward MRN: [id number] Date of Birth: 1950/09/04 Referring Provider (PT): Hazel Sams, PA-C   Encounter Date: 07/12/2018  PT End of Session - 07/12/18 1748    Visit Number  5    Number of Visits  12    Date for PT Re-Evaluation  07/21/18    Authorization Type  BCBS 30 VL    Authorization - Visit Number  4    Authorization - Number of Visits  30    PT Start Time  5027    PT Stop Time  1757   moist heat   PT Time Calculation (min)  49 min    Activity Tolerance  Patient tolerated treatment well;Patient limited by pain    Behavior During Therapy  [facility] for tasks assessed/performed       Past Medical History:  Diagnosis Date  . Arthritis   . Breast cancer ([place])   . Breast cancer ([place])   . Broken toe   . Fibromyalgia   . Hypertension     Past Surgical History:  Procedure Laterality Date  . ABDOMINAL HYSTERECTOMY    . BREAST SURGERY    . CESAREAN SECTION    . SMALL INTESTINE SURGERY      There were no vitals filed for this visit.  Subjective Assessment - 07/12/18 1710    Subjective  Reports she has been "sicker than sick" starting on new years eve. Still has been struggling with pain as well, however has tried to perform HEP as tolerated.     Diagnostic tests  nothing recent    Patient Stated Goals  reduce pain    Currently in Pain?  Yes    Pain Score  3     Pain Location  Leg    Pain Orientation  Right;Left;Anterior    Pain Descriptors / Indicators  Aching   pulling   Pain Type  Chronic pain                       [facility] Adult PT Treatment/Exercise - 07/12/18 0001      Lumbar Exercises: Supine   Ab Set  10 reps    AB Set Limitations  10x10" TrA activation    Pelvic Tilt  10 reps    Pelvic Tilt Limitations  ant/pos tilting  with cues for proper motion    Other Supine Lumbar Exercises  Hookying TrA contraction + alt shoulder flexion within painfree range 10x       Knee/Hip Exercises: Stretches   Other Knee/Hip Stretches  figure 4 stretch 30" R LE    could not tolerate   Other Knee/Hip Stretches  SKTC x30" each LE   mild c/o lateral hip pain on R LE     Knee/Hip Exercises: Aerobic   Nustep  L2 x 6 min (UEs/LEs)      Knee/Hip Exercises: Seated   Other Seated Knee/Hip Exercises  ab set w/ beachball 10x10"      Knee/Hip Exercises: Supine   Bridges  Both;10 reps;Strengthening;1 set    Bridges Limitations  cues for core contraction    Bridges with [employer]  Strengthening;Both;1 set;10 reps    Other Supine Knee/Hip Exercises  TrA contraction + marching x20     Other Supine Knee/Hip Exercises  LTR x20 to  tolerance      Moist Heat Therapy   Number Minutes Moist Heat  10 Minutes   B shoulder and B hip   Moist Heat Location  Shoulder;Hip                  PT Long Term Goals - 06/14/18 1745      PT LONG TERM GOAL #1   Title  Pt will be I and compliant with HEP. 6 weeks 07/21/18    Status  On-going      PT LONG TERM GOAL #2   Title  Pt will improve hip, lumbar, shoulder ROM to [facility]. 6 weeks 07/21/18    Status  On-going      PT LONG TERM GOAL #3   Title  Pt will be able to ambulate community distances and go up/down 2 flights of stairs with less than 3/10 pain overall. 6 weeks 07/21/18    Status  On-going      PT LONG TERM GOAL #4   Title  Pt will improve LE and UE strength to overall 4+/5 grossly. 6 weeks 07/21/18    Status  On-going            Plan - 07/12/18 1748    Clinical Impression Statement  Patient arrived late to session with report that she has been quite sick since [place] Years Eve. Reports she has tried to maintain compliance with HEP throughout her illness. Worked on gentle lumbopelvic ROM and core activation to patient's tolerance to avoid exacerbation of pain. Unable to  tolerate supine LE stretching today d/t R shoulder and hip pain- these stretches were discontinued. Able to progress bridges with ball squeeze with good form.  Patient requested moist heat to B shoulders and hips at end of session. No complaints upon conclusion of session.     Clinical Impairments Affecting Rehab Potential  chronic conditions    PT Treatment/Interventions  Cryotherapy;Electrical Stimulation;Iontophoresis 4mg /ml Dexamethasone;Moist Heat;Traction;Ultrasound;Gait training;Stair training;Therapeutic activities;Therapeutic exercise;Neuromuscular re-education;Manual techniques;Passive range of motion;Dry needling;Taping;Joint Manipulations;Spinal Manipulations    PT Next Visit Plan  gentle ROM and strengthening to tol; gradually progress HEP as tolerated    Consulted and Agree with Plan of Care  Patient       Patient will benefit from skilled therapeutic intervention in order to improve the following deficits and impairments:  Abnormal gait, Decreased activity tolerance, Decreased endurance, Decreased range of motion, Decreased strength, Increased fascial restricitons, Increased muscle spasms, Impaired flexibility, Improper body mechanics, Postural dysfunction, Pain  Visit Diagnosis: Chronic right shoulder pain  Pain in right hip  Muscle weakness (generalized)     Problem List Patient Active Problem List   Diagnosis Date Noted  . Fibromyalgia 10/09/2016  . Primary insomnia 10/09/2016  . Other fatigue 10/09/2016  . Tendinopathy of right shoulder 10/09/2016  . Trochanteric bursitis of right hip 10/09/2016  . Primary osteoarthritis of both hands 10/09/2016  . Chronic kidney disease 10/09/2016  . Essential hypertension 10/09/2016  . Gastroesophageal reflux disease 10/09/2016  . Restless leg syndrome 10/09/2016  . Malignant neoplasm of female breast ([place]) 10/09/2016  . History of breast cancer 10/09/2016  . Genital herpes simplex 10/09/2016    Janene Harvey, PT,  DPT 07/12/18 5:59 PM   [facility] [address]  [place], [place], [address] Phone: [phone]   Fax:  [phone]  Name: Whitney Ward MRN: [id number] Date of Birth: 02/27/1951

## 2018-07-15 ENCOUNTER — Encounter: Payer: Self-pay | Admitting: Physical Therapy

## 2018-07-15 ENCOUNTER — Ambulatory Visit: Payer: BLUE CROSS/BLUE SHIELD | Admitting: Physical Therapy

## 2018-07-15 DIAGNOSIS — G8929 Other chronic pain: Secondary | ICD-10-CM

## 2018-07-15 DIAGNOSIS — M25511 Pain in right shoulder: Secondary | ICD-10-CM | POA: Diagnosis not present

## 2018-07-15 DIAGNOSIS — M25551 Pain in right hip: Secondary | ICD-10-CM

## 2018-07-15 DIAGNOSIS — M6281 Muscle weakness (generalized): Secondary | ICD-10-CM

## 2018-07-15 NOTE — Therapy (Addendum)
[facility] [address]  [place], [place], [address] Phone: [phone]   Fax:  [phone]  Physical Therapy Treatment/Discharge Addendum  Patient Details  Name: Whitney Ward MRN: [id number] Date of Birth: 01/11/1951 Referring Provider (PT): Hazel Sams, PA-C   Encounter Date: 07/15/2018  PT End of Session - 07/15/18 1706    Visit Number  6    Number of Visits  12    Date for PT Re-Evaluation  07/21/18    Authorization Type  BCBS 30 VL    Authorization - Visit Number  6    Authorization - Number of Visits  30    PT Start Time  0814    PT Stop Time  1751    PT Time Calculation (min)  45 min    Activity Tolerance  Patient tolerated treatment well;Patient limited by pain    Behavior During Therapy  [facility] for tasks assessed/performed       Past Medical History:  Diagnosis Date  . Arthritis   . Breast cancer ([place])   . Breast cancer ([place])   . Broken toe   . Fibromyalgia   . Hypertension     Past Surgical History:  Procedure Laterality Date  . ABDOMINAL HYSTERECTOMY    . BREAST SURGERY    . CESAREAN SECTION    . SMALL INTESTINE SURGERY      There were no vitals filed for this visit.  Subjective Assessment - 07/15/18 1710    Subjective  Reports back and leg pain stays at 3-4/10 most of the day, but goes up to 7/10 as soon as she has to get up.    Diagnostic tests  nothing recent    Patient Stated Goals  reduce pain    Currently in Pain?  Yes    Pain Score  4    3-4/10   Pain Location  Back    Pain Orientation  Right;Left;Lower    Pain Descriptors / Indicators  Burning   & pulling   Pain Type  Chronic pain    Pain Radiating Towards  down into both legs    Pain Frequency  Constant    Pain Score  0   up to 2/10 with activity   Pain Location  Shoulder    Pain Orientation  Right;Left    Pain Frequency  Intermittent         [facility] PT Assessment - 07/15/18 1706      Assessment   Medical  Diagnosis  Pain in right shoulder, Pain in right hip, fibromyalgia    Referring Provider (PT)  Hazel Sams, PA-C    Onset Date/Surgical Date  --   chronic but worse over last 5 months   Hand Dominance  Right    Next MD Visit  --   not for ~6 months from eval     [facility]   Right Shoulder Extension  --   [facility]   Right Shoulder Flexion  120 Degrees    Right Shoulder ABduction  124 Degrees    Right Shoulder Internal Rotation  --   [facility]   Right Shoulder External Rotation  --   [facility]   Lumbar Flexion  90% - tight    Lumbar Extension  50% - low back/buttock pain    Lumbar - Right Side Bend  80% - pain    Lumbar - Left Side Bend  80% - pain    Lumbar - Right Rotation  [facility]  Lumbar - Left Rotation  [facility]      Strength   Strength Assessment Site  Shoulder;Hip;Knee    Right/Left Shoulder  Right;Left    Right Shoulder Flexion  4+/5    Right Shoulder ABduction  4+/5    Right Shoulder Internal Rotation  4+/5    Right Shoulder External Rotation  4+/5    Left Shoulder Flexion  4+/5    Left Shoulder ABduction  4+/5    Left Shoulder Internal Rotation  4+/5    Left Shoulder External Rotation  4+/5    Right/Left Hip  Right;Left    Right Hip Flexion  4/5    Right Hip Extension  4-/5    Right Hip External Rotation   4-/5    Right Hip Internal Rotation  4/5    Right Hip ABduction  4+/5    Right Hip ADduction  4+/5    Left Hip Flexion  4/5    Left Hip Extension  4-/5    Left Hip External Rotation  4/5    Left Hip Internal Rotation  4+/5    Left Hip ABduction  4+/5    Left Hip ADduction  4+/5    Right/Left Knee  Right;Left    Right Knee Flexion  4/5    Right Knee Extension  4+/5    Left Knee Flexion  5/5    Left Knee Extension  5/5                   [facility] Adult PT Treatment/Exercise - 07/15/18 1706      Exercises   Exercises  Neck;Knee/Hip      Lumbar Exercises: Seated   Other Seated Lumbar Exercises  Seated rows & shoulder extension + scap retraction with yellow TB x10 each       Lumbar Exercises: Supine   Clam  10 reps;3 seconds    Clam Limitations  abd bracing + alt hip ABD/ER with yellow TB    Bent Knee Raise  10 reps;3 seconds    Bent Knee Raise Limitations  brace marching with yellow TB at knees    Dead Bug  10 reps;3 seconds    Dead Bug Limitations  cues for abd bracing       Knee/Hip Exercises: Aerobic   Recumbent Bike  L1 x 6 min      Knee/Hip Exercises: Sidelying   Clams  R clam x 5 with yellow TB             PT Education - 07/15/18 1750    Education Details  HEP review, update and instruction in self progression    Person(s) Educated  Patient    Methods  Explanation;Demonstration;Handout    Comprehension  Verbalized understanding;Returned demonstration          PT Long Term Goals - 07/15/18 1716      PT LONG TERM GOAL #1   Title  Pt will be I and compliant with HEP. 6 weeks 07/21/18    Status  Achieved      PT LONG TERM GOAL #2   Title  Pt will improve hip, lumbar, shoulder ROM to [facility]. 6 weeks 07/21/18    Status  Partially Met      PT LONG TERM GOAL #3   Title  Pt will be able to ambulate community distances and go up/down 2 flights of stairs with less than 3/10 pain overall. 6 weeks 07/21/18    Status  Partially Met  PT LONG TERM GOAL #4   Title  Pt will improve LE and UE strength to overall 4+/5 grossly. 6 weeks 07/21/18    Status  Partially Met            Plan - 07/15/18 1751    Clinical Impression Statement  Shandie has demonstrated progress with PT since [facility] with improved lumbar and shoulder ROM as well as improving UE & LE strength, altough pt still notes some restrictions due to pain and tightness. Overall pain ratings decreased, but pt still reporting pain can increase to 7/10 when she needs to get up and move around at work. Pt reporting ability to walk limited community distances and negotiate stairs as indicated in goals, but still experiences pain > 3/10 with these activities. Goals only partially met at  this time - discussed options of extending PT POC vs attempting transition to HEP with 30 day hold for PT, with pt opting for transition to HEP. HEP reviewed and updated, along with instruction in continued self-progression of exercises at home with pt verbalizing and demonstrating understanding.    Clinical Impairments Affecting Rehab Potential  chronic conditions    PT Treatment/Interventions  Cryotherapy;Electrical Stimulation;Iontophoresis 67m/ml Dexamethasone;Moist Heat;Traction;Ultrasound;Gait training;Stair training;Therapeutic activities;Therapeutic exercise;Neuromuscular re-education;Manual techniques;Passive range of motion;Dry needling;Taping;Joint Manipulations;Spinal Manipulations    PT Next Visit Plan  30 day hold    Consulted and Agree with Plan of Care  Patient       Patient will benefit from skilled therapeutic intervention in order to improve the following deficits and impairments:  Abnormal gait, Decreased activity tolerance, Decreased endurance, Decreased range of motion, Decreased strength, Increased fascial restricitons, Increased muscle spasms, Impaired flexibility, Improper body mechanics, Postural dysfunction, Pain  Visit Diagnosis: Chronic right shoulder pain  Pain in right hip  Muscle weakness (generalized)     Problem List Patient Active Problem List   Diagnosis Date Noted  . Fibromyalgia 10/09/2016  . Primary insomnia 10/09/2016  . Other fatigue 10/09/2016  . Tendinopathy of right shoulder 10/09/2016  . Trochanteric bursitis of right hip 10/09/2016  . Primary osteoarthritis of both hands 10/09/2016  . Chronic kidney disease 10/09/2016  . Essential hypertension 10/09/2016  . Gastroesophageal reflux disease 10/09/2016  . Restless leg syndrome 10/09/2016  . [place] 10/09/2016  . History of breast cancer 10/09/2016  . Genital herpes simplex 10/09/2016    JPercival Spanish PT, MPT 07/15/2018, 6:18 PM  PHYSICAL THERAPY  DISCHARGE SUMMARY  Visits from Start of Care: 6  Current functional level related to goals / functional outcomes: See above   Remaining deficits: See above   Education / Equipment: HEP Plan: Patient agrees to discharge.  Patient goals were partially met. Patient is being discharged due to not returning since the last visit.  ?????    BElsie Ra PT, DPT 08/19/18 10:56 AM      [facility] [place] [address]Phone: [phone]  Fax:  [phone] Name: PTAJANA CROTTEAUMRN: [id number] of Birth: [birth year]

## 2018-08-13 ENCOUNTER — Other Ambulatory Visit: Payer: Self-pay | Admitting: Rheumatology

## 2018-08-13 NOTE — Telephone Encounter (Signed)
Last Visit: 06/02/18 Next Visit: 12/02/18  Okay to refill per Dr. Estanislado Pandy

## 2018-08-31 NOTE — Progress Notes (Signed)
Office Visit Note  Patient: Whitney Ward             Date of Birth: May 31, 1951           MRN: [id number]             PCP: Rocky Morel, MD Referring: Huey Romans* Visit Date: 09/01/2018 Occupation: @GUAROCC @  Subjective:  Left trochanteric bursitis   History of Present Illness: Whitney Ward is a 68 y.o. female with history of fibromyalgia and osteoarthritis.  She reports she is having significant discomfort due to left trochanteric bursitis.    She reports physical therapy helped but the pain returned over the past 1.5 months.  She reports the pain is hindering her level of activity.  She has pain when she rises from a chair that radiates down both legs.  She has been experiencing left hip pain that radiates to her groin. She reports the pain has been severe for 1 week.  She denies any recent injuries.  She continues to have chronic pain in both knee joints. She has increased muscle tenderness in bilateral lower extremities. She denies any joint swelling.   Activities of Daily Living:  Patient reports morning stiffness for 1  hour.   Patient Reports nocturnal pain.  Difficulty dressing/grooming: Denies Difficulty climbing stairs: Reports Difficulty getting out of chair: Reports Difficulty using hands for taps, buttons, cutlery, and/or writing: Denies  Review of Systems  Constitutional: Positive for fatigue.  HENT: Positive for mouth dryness. Negative for mouth sores and nose dryness.   Eyes: Negative for pain, visual disturbance and dryness.  Respiratory: Negative for cough, hemoptysis, shortness of breath and difficulty breathing.   Cardiovascular: Negative for chest pain, palpitations, hypertension and swelling in legs/feet.  Gastrointestinal: Negative for blood in stool, constipation and diarrhea.  Endocrine: Negative for increased urination.  Genitourinary: Negative for painful urination.  Musculoskeletal: Positive for arthralgias, joint  pain, myalgias, morning stiffness, muscle tenderness and myalgias. Negative for joint swelling and muscle weakness.  Skin: Negative for color change, pallor, rash, hair loss, nodules/bumps, skin tightness, ulcers and sensitivity to sunlight.  Allergic/Immunologic: Negative for susceptible to infections.  Neurological: Negative for dizziness, numbness, headaches and weakness.  Hematological: Negative for swollen glands.  Psychiatric/Behavioral: Positive for sleep disturbance. Negative for depressed mood. The patient is not nervous/anxious.     PMFS History:  Patient Active Problem List   Diagnosis Date Noted  . Fibromyalgia 10/09/2016  . Primary insomnia 10/09/2016  . Other fatigue 10/09/2016  . Tendinopathy of right shoulder 10/09/2016  . Trochanteric bursitis of right hip 10/09/2016  . Primary osteoarthritis of both hands 10/09/2016  . Chronic kidney disease 10/09/2016  . Essential hypertension 10/09/2016  . Gastroesophageal reflux disease 10/09/2016  . Restless leg syndrome 10/09/2016  . Malignant neoplasm of female breast ([place]) 10/09/2016  . History of breast cancer 10/09/2016  . Genital herpes simplex 10/09/2016    Past Medical History:  Diagnosis Date  . Arthritis   . Breast cancer ([place])   . Breast cancer ([place])   . Broken toe   . Fibromyalgia   . Hypertension     History reviewed. No pertinent family history. Past Surgical History:  Procedure Laterality Date  . ABDOMINAL HYSTERECTOMY    . BREAST SURGERY    . CESAREAN SECTION    . SMALL INTESTINE SURGERY     Social History   Social History Narrative  . Not on file    There is no immunization history on file  for this patient.   Objective: Vital Signs: BP 132/80 (BP Location: Right Arm, Patient Position: Sitting, Cuff Size: Normal)   Pulse 82   Resp 14   Ht 6' (1.829 m)   Wt 220 lb (99.8 kg)   BMI 29.84 kg/m    Physical Exam Vitals signs and nursing note reviewed.  Constitutional:      Appearance: She  is well-developed.  HENT:     Head: Normocephalic and atraumatic.  Eyes:     Conjunctiva/sclera: Conjunctivae normal.  Neck:     Musculoskeletal: Normal range of motion.  Cardiovascular:     Rate and Rhythm: Normal rate and regular rhythm.     Heart sounds: Normal heart sounds.  Pulmonary:     Effort: Pulmonary effort is normal.     Breath sounds: Normal breath sounds.  Abdominal:     General: Bowel sounds are normal.     Palpations: Abdomen is soft.  Lymphadenopathy:     Cervical: No cervical adenopathy.  Skin:    General: Skin is warm and dry.     Capillary Refill: Capillary refill takes less than 2 seconds.  Neurological:     Mental Status: She is alert and oriented to person, place, and time.  Psychiatric:        Behavior: Behavior normal.      Musculoskeletal Exam: C-spine, thoracic spine, and lumbar spine good ROM.  No midline spinal tenderness.  No SI joint tenderness.  Shoulder joints, elbow joints, wrist joints, MCPs, PIPs, and DIPs good ROM with no synovitis.  Complete fist formation bilaterally.  Quad tenderness bilaterally.  Left hip good ROM with discomfort. bilateral trochanteric bursitis.  Right hip good ROM with no discomfort. Knee joints, ankle joints, MTPs, PIPs, and DIPs good ROM with no synovitis.  No warmth or effusion of knee joints.  No tenderness or swelling of ankle joints.  CDAI Exam: CDAI Score: Not documented Patient Global Assessment: Not documented; Provider Global Assessment: Not documented Swollen: Not documented; Tender: Not documented Joint Exam   Not documented   There is currently no information documented on the homunculus. Go to the Rheumatology activity and complete the homunculus joint exam.  Investigation: No additional findings.  Imaging: Xr Hips Bilat W Or W/o Pelvis 3-4 Views  Result Date: 09/01/2018 No hip joint narrowing or SI joint changes were noted.  No chondrocalcinosis was noted. Impression: Unremarkable x-ray of the hip  joints.  Xr Knee 3 View Left  Result Date: 09/01/2018 Moderate medial compartment narrowing was noted.  No chondrocalcinosis was noted.  Moderate patellofemoral narrowing was noted. Impression: These findings are consistent with moderate osteoarthritis and moderate chondromalacia patella.  Xr Knee 3 View Right  Result Date: 09/01/2018 Mild medial compartment narrowing was noted.  Moderate patellofemoral narrowing was noted.  No chondrocalcinosis was noted. Impression: These findings are consistent with mild osteoarthritis and moderate chondromalacia patella.   Recent Labs: Lab Results  Component Value Date   WBC 4.5 11/30/2017   HGB 12.1 11/30/2017   PLT 268 11/30/2017   NA 141 11/30/2017   K 3.7 11/30/2017   CL 102 11/30/2017   CO2 31 11/30/2017   GLUCOSE 85 11/30/2017   BUN 19 11/30/2017   CREATININE 1.05 (H) 11/30/2017   BILITOT 0.6 11/30/2017   ALKPHOS 99 10/15/2016   AST 19 11/30/2017   ALT 13 11/30/2017   PROT 7.6 11/30/2017   ALBUMIN 4.4 10/15/2016   CALCIUM 9.8 11/30/2017   GFRAA 64 11/30/2017    Speciality Comments:  No specialty comments available.  Procedures:  Large Joint Inj: bilateral greater trochanter on 09/01/2018 9:38 AM Indications: pain Details: 27 G 1.5 in needle, lateral approach  Arthrogram: No  Medications (Right): 1.5 mL lidocaine 1 %; 40 mg triamcinolone acetonide 40 MG/ML Aspirate (Right): 0 mL Medications (Left): 1.5 mL lidocaine 1 %; 40 mg triamcinolone acetonide 40 MG/ML Aspirate (Left): 0 mL Outcome: tolerated well, no immediate complications Procedure, treatment alternatives, risks and benefits explained, specific risks discussed. Consent was given by the patient. Immediately prior to procedure a time out was called to verify the correct patient, procedure, equipment, support staff and site/side marked as required. Patient was prepped and draped in the usual sterile fashion.     Allergies: Codeine; Penicillins; Sulfa antibiotics;  Tramadol; Aromasin [exemestane]; Atorvastatin; Celexa [citalopram hydrobromide]; Citalopram; and Tamoxifen      Assessment / Plan:     Visit Diagnoses: Fibromyalgia: She has generalized hyperalgesia and positive tender points on exam.  She has been having increased muscle tenderness and muscle aches in bilateral lower extremities.She has bilateral trochanteric bursitis.  We referred her to PT in the past which helped but her symptoms have progressively been returning over the past 1.5 months. She has been having increased bilateral hip and bilateral knee joint pain. X-rays of both hips and both knees were obtained toady.  We will also check a CK and sed rate.  She has chronic fatigue and insomnia. The need for regular exercise and good sleep hygiene was discussed.    Primary insomnia:She takes Klonopin 1 mg po at bedtime to help with insomnia.    Other fatigue: She has chronic fatigue related to insomnia.    Tendinopathy of right shoulder: She has good ROM with no discomfort.   Trochanteric bursitis of both hips: She has tenderness over bilateral trochanteric bursa.  She went to PT which provided some pain relief but her pain has progressively been worsening over the past 1.5 months.  She was given a handout of exercises to perform. Bilateral trochanteric bursa were injected with cortisone after informed consent was obtained. Procedure note completed above.   Primary osteoarthritis of both hands: She experiences stiffness in both hands but no tenderness or synovitis noted.  Joint protection and muscle strengthening were discussed.   Chronic pain of both knees - X-rays of both knee joints were obtained today that revealed moderate osteoarthritis and moderate chondromalacia patella.  No chondrocalcinosis noted. Plan: XR KNEE 3 VIEW LEFT, XR KNEE 3 VIEW RIGHT  Bilateral hip pain -She has been having increased discomfort in both hip joints, especially the left hip joint.  She has good ROM bilaterally  but experiences discomfort with left hip ROM.  X-rays of both hips were obtained today.   Plan: XR HIPS BILAT W OR W/O PELVIS 3-4 VIEWS  Myalgia - She has been having increased myalgias and muscle tenderness of bilateral lower extremities.  CK and sed rate will be checked today. Plan: Sedimentation rate, CK  Medication monitoring encounter - She is taking Celebrex 200 mg 1 tablet by mouth PRN for pain relief.  CBC and CMP will be checked today. Plan: COMPLETE METABOLIC PANEL WITH GFR, CBC with Differential/Platelet   Other medical conditions are listed as follows:   History of gastroesophageal reflux (GERD)  History of hypertension  History of breast cancer  Restless leg syndrome   Orders: Orders Placed This Encounter  Procedures  . Large Joint Inj: bilateral greater trochanter  . XR KNEE 3 VIEW LEFT  .  XR KNEE 3 VIEW RIGHT  . XR HIPS BILAT W OR W/O PELVIS 3-4 VIEWS  . Sedimentation rate  . CK  . COMPLETE METABOLIC PANEL WITH GFR  . CBC with Differential/Platelet   No orders of the defined types were placed in this encounter.   Face-to-face time spent with patient was 30 minutes. Greater than 50% of time was spent in counseling and coordination of care.  Follow-Up Instructions: Return in about 6 months (around 03/04/2019) for Fibromyalgia, Osteoarthritis.  Hazel Sams, PA-C I examined and evaluated the patient with Hazel Sams PA.  Patient has no synovitis on examination.  X-ray findings were discussed at length.  Handout on exercises was given.  The plan of care was discussed as noted above.  Bo Merino, MD  Note - This record has been created using Editor, commissioning.  Chart creation errors have been sought, but may not always  have been located. Such creation errors do not reflect on  the standard of medical care.

## 2018-09-01 ENCOUNTER — Ambulatory Visit (INDEPENDENT_AMBULATORY_CARE_PROVIDER_SITE_OTHER): Payer: Self-pay

## 2018-09-01 ENCOUNTER — Encounter: Payer: Self-pay | Admitting: Physician Assistant

## 2018-09-01 ENCOUNTER — Ambulatory Visit: Payer: BLUE CROSS/BLUE SHIELD | Admitting: Physician Assistant

## 2018-09-01 ENCOUNTER — Encounter (INDEPENDENT_AMBULATORY_CARE_PROVIDER_SITE_OTHER): Payer: Self-pay

## 2018-09-01 VITALS — BP 132/80 | HR 82 | Resp 14 | Ht 72.0 in | Wt 220.0 lb

## 2018-09-01 DIAGNOSIS — M7062 Trochanteric bursitis, left hip: Secondary | ICD-10-CM

## 2018-09-01 DIAGNOSIS — M7061 Trochanteric bursitis, right hip: Secondary | ICD-10-CM

## 2018-09-01 DIAGNOSIS — M25562 Pain in left knee: Secondary | ICD-10-CM | POA: Diagnosis not present

## 2018-09-01 DIAGNOSIS — R5383 Other fatigue: Secondary | ICD-10-CM

## 2018-09-01 DIAGNOSIS — Z8679 Personal history of other diseases of the circulatory system: Secondary | ICD-10-CM

## 2018-09-01 DIAGNOSIS — M25561 Pain in right knee: Secondary | ICD-10-CM

## 2018-09-01 DIAGNOSIS — M791 Myalgia, unspecified site: Secondary | ICD-10-CM

## 2018-09-01 DIAGNOSIS — M25551 Pain in right hip: Secondary | ICD-10-CM

## 2018-09-01 DIAGNOSIS — G2581 Restless legs syndrome: Secondary | ICD-10-CM

## 2018-09-01 DIAGNOSIS — M797 Fibromyalgia: Secondary | ICD-10-CM

## 2018-09-01 DIAGNOSIS — Z8719 Personal history of other diseases of the digestive system: Secondary | ICD-10-CM

## 2018-09-01 DIAGNOSIS — F5101 Primary insomnia: Secondary | ICD-10-CM

## 2018-09-01 DIAGNOSIS — G8929 Other chronic pain: Secondary | ICD-10-CM

## 2018-09-01 DIAGNOSIS — Z5181 Encounter for therapeutic drug level monitoring: Secondary | ICD-10-CM

## 2018-09-01 DIAGNOSIS — M25552 Pain in left hip: Secondary | ICD-10-CM

## 2018-09-01 DIAGNOSIS — M67911 Unspecified disorder of synovium and tendon, right shoulder: Secondary | ICD-10-CM

## 2018-09-01 DIAGNOSIS — Z853 Personal history of malignant neoplasm of breast: Secondary | ICD-10-CM

## 2018-09-01 DIAGNOSIS — M19042 Primary osteoarthritis, left hand: Secondary | ICD-10-CM

## 2018-09-01 DIAGNOSIS — M19041 Primary osteoarthritis, right hand: Secondary | ICD-10-CM

## 2018-09-01 MED ORDER — TRIAMCINOLONE ACETONIDE 40 MG/ML IJ SUSP
40.0000 mg | INTRAMUSCULAR | Status: AC | PRN
  Administered 2018-09-01: 40 mg via INTRA_ARTICULAR

## 2018-09-01 MED ORDER — LIDOCAINE HCL 1 % IJ SOLN
1.5000 mL | INTRAMUSCULAR | Status: AC | PRN
  Administered 2018-09-01: 1.5 mL

## 2018-09-01 NOTE — Patient Instructions (Signed)

## 2018-09-02 ENCOUNTER — Telehealth: Payer: Self-pay | Admitting: Physician Assistant

## 2018-09-02 LAB — COMPLETE METABOLIC PANEL WITH GFR
AG Ratio: 1.6 (calc) (ref 1.0–2.5)
ALBUMIN MSPROF: 4.4 g/dL (ref 3.6–5.1)
ALT: 16 U/L (ref 6–29)
AST: 21 U/L (ref 10–35)
Alkaline phosphatase (APISO): 95 U/L (ref 37–153)
BUN/Creatinine Ratio: 13 (calc) (ref 6–22)
BUN: 13 mg/dL (ref 7–25)
CO2: 30 mmol/L (ref 20–32)
Calcium: 9.5 mg/dL (ref 8.6–10.4)
Chloride: 105 mmol/L (ref 98–110)
Creat: 1.01 mg/dL — ABNORMAL HIGH (ref 0.50–0.99)
GFR, Est African American: 66 mL/min/{1.73_m2} (ref >=60)
GFR, Est Non African American: 57 mL/min/{1.73_m2} — ABNORMAL LOW (ref >=60)
Globulin: 2.7 g/dL (calc) (ref 1.9–3.7)
Glucose, Bld: 95 mg/dL (ref 65–99)
Potassium: 3.7 mmol/L (ref 3.5–5.3)
Sodium: 143 mmol/L (ref 135–146)
Total Bilirubin: 0.6 mg/dL (ref 0.2–1.2)
Total Protein: 7.1 g/dL (ref 6.1–8.1)

## 2018-09-02 LAB — CBC WITH DIFFERENTIAL/PLATELET
Absolute Monocytes: 365 cells/uL (ref 200–950)
BASOS ABS: 10 {cells}/uL (ref 0–200)
Basophils Relative: 0.2 %
Eosinophils Absolute: 82 cells/uL (ref 15–500)
Eosinophils Relative: 1.7 %
HCT: 36.6 % (ref 35.0–45.0)
Hemoglobin: 12.5 g/dL (ref 11.7–15.5)
Lymphs Abs: 1181 cells/uL (ref 850–3900)
MCH: 30.9 pg (ref 27.0–33.0)
MCHC: 34.2 g/dL (ref 32.0–36.0)
MCV: 90.6 fL (ref 80.0–100.0)
MPV: 10.6 fL (ref 7.5–12.5)
Monocytes Relative: 7.6 %
Neutro Abs: 3163 cells/uL (ref 1500–7800)
Neutrophils Relative %: 65.9 %
Platelets: 258 10*3/uL (ref 140–400)
RBC: 4.04 10*6/uL (ref 3.80–5.10)
RDW: 12.8 % (ref 11.0–15.0)
Total Lymphocyte: 24.6 %
WBC: 4.8 10*3/uL (ref 3.8–10.8)

## 2018-09-02 LAB — CK: Total CK: 251 U/L — ABNORMAL HIGH (ref 29–143)

## 2018-09-02 LAB — SEDIMENTATION RATE: Sed Rate: 29 mm/h (ref 0–30)

## 2018-09-02 NOTE — Telephone Encounter (Signed)
Open in error

## 2018-09-02 NOTE — Progress Notes (Signed)
Sed rate WNL. CK is elevated-251.  Please advise patient to return to check aldolase and myositis panel.   CBC WNL. Creatinine is borderline elevated but GFR is 66.  Please advise patient to take celebrex very sparingly.

## 2018-09-03 ENCOUNTER — Telehealth: Payer: Self-pay | Admitting: *Deleted

## 2018-09-03 ENCOUNTER — Other Ambulatory Visit: Payer: Self-pay

## 2018-09-03 DIAGNOSIS — R748 Abnormal levels of other serum enzymes: Secondary | ICD-10-CM

## 2018-09-03 NOTE — Telephone Encounter (Signed)
-----   Message from Ofilia Neas, PA-C sent at 09/02/2018  4:57 PM EST ----- Sed rate WNL. CK is elevated-251.  Please advise patient to return to check aldolase and myositis panel.   CBC WNL. Creatinine is borderline elevated but GFR is 66.  Please advise patient to take celebrex very sparingly.

## 2018-09-07 NOTE — Progress Notes (Signed)
Aldolase is normal.  Myositis panel is pending.

## 2018-09-10 ENCOUNTER — Telehealth: Payer: Self-pay | Admitting: Rheumatology

## 2018-09-10 NOTE — Telephone Encounter (Signed)
Patient called stating she received the results from one of her lab tests, but is requesting a return call with the results of the second test.

## 2018-09-10 NOTE — Telephone Encounter (Signed)
Patient advised we have not received the lab results for the myositis panel. Patient advised will call when resulted.

## 2018-09-11 LAB — MYOSITIS ASSESSR PLUS JO-1 AUTOABS
EJ Autoabs: NOT DETECTED
Jo-1 Autoabs: <1 AI
Ku Autoabs: NOT DETECTED
MI-2 AUTOABS: NOT DETECTED
OJ Autoabs: NOT DETECTED
PL-12 AUTOABS: NOT DETECTED
PL-7 Autoabs: NOT DETECTED
SRP Autoabs: NOT DETECTED

## 2018-09-11 LAB — ALDOLASE: Aldolase: 6.5 U/L (ref <=8.1)

## 2018-09-13 ENCOUNTER — Telehealth (INDEPENDENT_AMBULATORY_CARE_PROVIDER_SITE_OTHER): Payer: Self-pay | Admitting: *Deleted

## 2018-09-13 NOTE — Telephone Encounter (Signed)
error 

## 2018-11-16 ENCOUNTER — Other Ambulatory Visit: Payer: Self-pay | Admitting: Rheumatology

## 2018-11-16 NOTE — Telephone Encounter (Signed)
Last visit: 09/01/18 Next visit: 03/08/19  Okay to refill per Dr. Estanislado Pandy

## 2018-12-02 ENCOUNTER — Ambulatory Visit: Payer: BLUE CROSS/BLUE SHIELD | Admitting: Physician Assistant

## 2019-02-22 ENCOUNTER — Other Ambulatory Visit: Payer: Self-pay | Admitting: Rheumatology

## 2019-02-22 NOTE — Telephone Encounter (Signed)
Last visit: 09/01/18 Next visit: 03/08/19  Okay to refill per Dr. Deveshwar  

## 2019-02-22 NOTE — Progress Notes (Signed)
Office Visit Note  Patient: Whitney Ward             Date of Birth: [birth year]           MRN: [id number]             PCP: Rocky Morel, MD Referring: Huey Romans* Visit Date: 03/08/2019 Occupation: @GUAROCC @  Subjective:  Trochanteric bursitis bilaterally   History of Present Illness: Whitney Ward is a 68 y.o. female with history of fibromyalgia and osteoarthritis.  She takes baclofen 10 mg 1 tablet by mouth 3 times daily for muscle spasms, which has been very effective.  She is no longer taking Celebrex 200 mg 1 capsule by mouth for pain relief. She continues to take Klonopin 1 mg 1 tablet by mouth at bedtime as needed.  She reports she has been having more frequent and severe fibromyalgia flares.  She has not been exercising as much due to COVID-19.  She continues with generalized muscle aches and muscle tenderness.  She has trochanter bursitis bilaterally.  She is been experiencing pain at night due to the discomfort.  She has chronic fatigue related to insomnia.  She has been performing stretching exercises for bursitis on a regular basis.  She had cortisone injections in March and 2020 which helped so she would like repeat injections today.  She has been applying Aspercreme topically as needed for pain relief.  She has been having increased stiffness in multiple joints.  She soaks and Epson salt baths which helped with her muscle aches and tenderness.  Activities of Daily Living:  Patient reports morning stiffness for several hours  Patient Reports nocturnal pain.  Difficulty dressing/grooming: Denies Difficulty climbing stairs: Denies Difficulty getting out of chair: Reports Difficulty using hands for taps, buttons, cutlery, and/or writing: Denies  Review of Systems  Constitutional: Negative for fatigue.  HENT: Negative for mouth sores, mouth dryness and nose dryness.   Eyes: Negative for pain, visual disturbance and dryness.  Respiratory:  Negative for cough, hemoptysis, shortness of breath and difficulty breathing.   Cardiovascular: Negative for chest pain, palpitations, hypertension and swelling in legs/feet.  Gastrointestinal: Negative for blood in stool, constipation and diarrhea.  Endocrine: Negative for increased urination.  Genitourinary: Negative for painful urination.  Musculoskeletal: Positive for myalgias, morning stiffness, muscle tenderness and myalgias. Negative for arthralgias, joint pain, joint swelling and muscle weakness.  Skin: Negative for color change, pallor, rash, hair loss, nodules/bumps, skin tightness, ulcers and sensitivity to sunlight.  Allergic/Immunologic: Negative for susceptible to infections.  Neurological: Negative for dizziness, numbness, headaches and weakness.  Hematological: Negative for swollen glands.  Psychiatric/Behavioral: Positive for sleep disturbance. Negative for depressed mood. The patient is not nervous/anxious.     PMFS History:  Patient Active Problem List   Diagnosis Date Noted  . Fibromyalgia 10/09/2016  . Primary insomnia 10/09/2016  . Other fatigue 10/09/2016  . Tendinopathy of right shoulder 10/09/2016  . Trochanteric bursitis of right hip 10/09/2016  . Primary osteoarthritis of both hands 10/09/2016  . Chronic kidney disease 10/09/2016  . Essential hypertension 10/09/2016  . Gastroesophageal reflux disease 10/09/2016  . Restless leg syndrome 10/09/2016  . Malignant neoplasm of female breast ([place]) 10/09/2016  . History of breast cancer 10/09/2016  . Genital herpes simplex 10/09/2016    Past Medical History:  Diagnosis Date  . Arthritis   . Breast cancer ([place])   . Breast cancer ([place])   . Broken toe   . Fibromyalgia   . Hypertension  History reviewed. No pertinent family history. Past Surgical History:  Procedure Laterality Date  . ABDOMINAL HYSTERECTOMY    . BREAST SURGERY    . CESAREAN SECTION    . SMALL INTESTINE SURGERY     Social History    Social History Narrative  . Not on file    There is no immunization history on file for this patient.   Objective: Vital Signs: BP 133/84 (BP Location: Right Arm, Patient Position: Sitting, Cuff Size: Normal)   Pulse 91   Resp 14   Ht 6' (1.829 m)   Wt 221 lb 9.6 oz (100.5 kg)   BMI 30.05 kg/m    Physical Exam Vitals signs and nursing note reviewed.  Constitutional:      Appearance: She is well-developed.  HENT:     Head: Normocephalic and atraumatic.  Eyes:     Conjunctiva/sclera: Conjunctivae normal.  Neck:     Musculoskeletal: Normal range of motion.  Cardiovascular:     Rate and Rhythm: Normal rate and regular rhythm.     Heart sounds: Normal heart sounds.  Pulmonary:     Effort: Pulmonary effort is normal.     Breath sounds: Normal breath sounds.  Abdominal:     General: Bowel sounds are normal.     Palpations: Abdomen is soft.  Lymphadenopathy:     Cervical: No cervical adenopathy.  Skin:    General: Skin is warm and dry.     Capillary Refill: Capillary refill takes less than 2 seconds.  Neurological:     Mental Status: She is alert and oriented to person, place, and time.  Psychiatric:        Behavior: Behavior normal.    Musculoskeletal Exam: C-spine, thoracic spine, and lumbar spine good ROM.  Shoulder joints, elbow joints, wrist joints, MCPs, PIPs, and DIPs good ROM with no synovitis.  Hip joints good ROM with no discomfort. Tenderness over bilateral trochanteric bursa.  Knee joints, ankle joints, MTPs, PIPs, and DIPs good ROM with no synovitis.  No warmth or effusion of knee joints.  No tenderness or swelling of ankle joints.   CDAI Exam: CDAI Score: - Patient Global: -; Provider Global: - Swollen: -; Tender: - Joint Exam   No joint exam has been documented for this visit   There is currently no information documented on the homunculus. Go to the Rheumatology activity and complete the homunculus joint exam.  Investigation: No additional findings.   Imaging: No results found.  Recent Labs: Lab Results  Component Value Date   WBC 4.8 09/01/2018   HGB 12.5 09/01/2018   PLT 258 09/01/2018   NA 143 09/01/2018   K 3.7 09/01/2018   CL 105 09/01/2018   CO2 30 09/01/2018   GLUCOSE 95 09/01/2018   BUN 13 09/01/2018   CREATININE 1.01 (H) 09/01/2018   BILITOT 0.6 09/01/2018   ALKPHOS 99 10/15/2016   AST 21 09/01/2018   ALT 16 09/01/2018   PROT 7.1 09/01/2018   ALBUMIN 4.4 10/15/2016   CALCIUM 9.5 09/01/2018   GFRAA 66 09/01/2018    Speciality Comments: No specialty comments available.  Procedures:  Large Joint Inj: bilateral greater trochanter on 03/08/2019 3:30 PM Indications: pain Details: 27 G 1.5 in needle, lateral approach  Arthrogram: No  Medications (Right): 1.5 mL lidocaine 1 %; 40 mg triamcinolone acetonide 40 MG/ML Medications (Left): 1.5 mL lidocaine 1 %; 40 mg triamcinolone acetonide 40 MG/ML Outcome: tolerated well, no immediate complications Procedure, treatment alternatives, risks and benefits explained, specific  risks discussed. Consent was given by the patient. Immediately prior to procedure a time out was called to verify the correct patient, procedure, equipment, support staff and site/side marked as required. Patient was prepped and draped in the usual sterile fashion.     Allergies: Codeine, Penicillins, Sulfa antibiotics, Tramadol, Aromasin [exemestane], Atorvastatin, Celexa [citalopram hydrobromide], Citalopram, and Tamoxifen   Assessment / Plan:     Visit Diagnoses: Fibromyalgia: She has been having more frequent and severe fibromyalgia flares recently.  She has been more sedentary and has not been exercising as much as she was before COVID-19.  She presents today with trochanteric bursitis bilaterally.  She had cortisone injections on 09/01/2018 which provided significant but temporary relief.  She requested repeat injections today.  She tolerated the procedure well.  We discussed the importance of  performing stretching exercises on a regular basis.  She was given a handout of exercises to perform.  She continues have generalized muscle aches and muscle tenderness.  She takes baclofen 10 mg 3 times daily for muscle spasms.  Baclofen has been very effective.  She does not need any refills at this time.  She was also encouraged to start exercising on a regular basis.  She has chronic fatigue related to insomnia.  That severity was discussed.  She continues to take Klonopin 1 mg by mouth at bedtime as needed.  She does not need any refills at this time.  She will follow-up in the office in 6 months.  Primary insomnia: She has been experiencing interrupted sleep at night due to discomfort she has been experiencing due to  trochanteric bursitis bilaterally.    Other fatigue: She has chronic fatigue related to insomnia.    Tendinopathy of right shoulder: She has good ROM with no discomfort.   Trochanteric bursitis of both hips: She has tenderness over bilateral trochanteric bursa on exam today.  She has been experiencing nocturnal pain at night.  She has difficulty walking for long distances due to the discomfort she experiences.  She had trochanteric bursa cortisone injections performed on 09/01/2018 which provided significant relief.  She would like repeat cortisone injections today.  She tolerated procedure well.  The procedure note was completed above.  She was given a handout of exercises to perform.  Primary osteoarthritis of both hands: She has no tenderness or synovitis on exam.  She has complete fist formation bilaterally.  She has no discomfort at this time.  Joint protection and muscle strengthening were discussed.  Elevated CK: CK 251 on 09/01/2018. Myositis panel negative and aldolase within normal limits on 09/03/2018.  She has no muscle weakness at this time.  She continues have generalized muscle aches and muscle tenderness due to fibromyalgia.  Other medical conditions are listed as follows:    History of gastroesophageal reflux (GERD)  History of hypertension  History of breast cancer  Restless leg syndrome  Orders: Orders Placed This Encounter  Procedures  . Large Joint Inj: bilateral greater trochanter   No orders of the defined types were placed in this encounter.   Face-to-face time spent with patient was 30 minutes. Greater than 50% of time was spent in counseling and coordination of care.  Follow-Up Instructions: Return in about 6 months (around 09/05/2019) for Fibromyalgia, Osteoarthritis.   Ofilia Neas, PA-C  Note - This record has been created using Dragon software.  Chart creation errors have been sought, but may not always  have been located. Such creation errors do not reflect on  the  standard of medical care.

## 2019-03-08 ENCOUNTER — Other Ambulatory Visit: Payer: Self-pay

## 2019-03-08 ENCOUNTER — Ambulatory Visit (INDEPENDENT_AMBULATORY_CARE_PROVIDER_SITE_OTHER): Payer: BC Managed Care – PPO | Admitting: Physician Assistant

## 2019-03-08 ENCOUNTER — Encounter: Payer: Self-pay | Admitting: Physician Assistant

## 2019-03-08 VITALS — BP 133/84 | HR 91 | Resp 14 | Ht 72.0 in | Wt 221.6 lb

## 2019-03-08 DIAGNOSIS — R748 Abnormal levels of other serum enzymes: Secondary | ICD-10-CM

## 2019-03-08 DIAGNOSIS — R5383 Other fatigue: Secondary | ICD-10-CM

## 2019-03-08 DIAGNOSIS — M797 Fibromyalgia: Secondary | ICD-10-CM

## 2019-03-08 DIAGNOSIS — M7061 Trochanteric bursitis, right hip: Secondary | ICD-10-CM | POA: Diagnosis not present

## 2019-03-08 DIAGNOSIS — G2581 Restless legs syndrome: Secondary | ICD-10-CM

## 2019-03-08 DIAGNOSIS — Z853 Personal history of malignant neoplasm of breast: Secondary | ICD-10-CM

## 2019-03-08 DIAGNOSIS — M67911 Unspecified disorder of synovium and tendon, right shoulder: Secondary | ICD-10-CM | POA: Diagnosis not present

## 2019-03-08 DIAGNOSIS — M7062 Trochanteric bursitis, left hip: Secondary | ICD-10-CM | POA: Diagnosis not present

## 2019-03-08 DIAGNOSIS — M19041 Primary osteoarthritis, right hand: Secondary | ICD-10-CM

## 2019-03-08 DIAGNOSIS — Z8719 Personal history of other diseases of the digestive system: Secondary | ICD-10-CM

## 2019-03-08 DIAGNOSIS — F5101 Primary insomnia: Secondary | ICD-10-CM | POA: Diagnosis not present

## 2019-03-08 DIAGNOSIS — M19042 Primary osteoarthritis, left hand: Secondary | ICD-10-CM

## 2019-03-08 DIAGNOSIS — Z8679 Personal history of other diseases of the circulatory system: Secondary | ICD-10-CM

## 2019-03-08 MED ORDER — TRIAMCINOLONE ACETONIDE 40 MG/ML IJ SUSP
40.0000 mg | INTRAMUSCULAR | Status: AC | PRN
  Administered 2019-03-08: 40 mg via INTRA_ARTICULAR

## 2019-03-08 MED ORDER — LIDOCAINE HCL 1 % IJ SOLN
1.5000 mL | INTRAMUSCULAR | Status: AC | PRN
  Administered 2019-03-08: 1.5 mL

## 2019-03-08 NOTE — Patient Instructions (Signed)
Iliotibial Band Syndrome Rehab Ask your health care provider which exercises are safe for you. Do exercises exactly as told by your health care provider and adjust them as directed. It is normal to feel mild stretching, pulling, tightness, or discomfort as you do these exercises. Stop right away if you feel sudden pain or your pain gets significantly worse. Do not begin these exercises until told by your health care provider. Stretching and range-of-motion exercises These exercises warm up your muscles and joints and improve the movement and flexibility of your hip and pelvis. Quadriceps stretch, prone  1. Lie on your abdomen on a firm surface, such as a bed or padded floor (prone position). 2. Bend your left / right knee and reach back to hold your ankle or pant leg. If you cannot reach your ankle or pant leg, loop a belt around your foot and grab the belt instead. 3. Gently pull your heel toward your buttocks. Your knee should not slide out to the side. You should feel a stretch in the front of your thigh and knee (quadriceps). 4. Hold this position for __________ seconds. Repeat __________ times. Complete this exercise __________ times a day. Iliotibial band stretch An iliotibial band is a strong band of muscle tissue that runs from the outer side of your hip to the outer side of your thigh and knee. 1. Lie on your side with your left / right leg in the top position. 2. Bend both of your knees and grab your left / right ankle. Stretch out your bottom arm to help you balance. 3. Slowly bring your top knee back so your thigh goes behind your trunk. 4. Slowly lower your top leg toward the floor until you feel a gentle stretch on the outside of your left / right hip and thigh. If you do not feel a stretch and your knee will not fall farther, place the heel of your other foot on top of your knee and pull your knee down toward the floor with your foot. 5. Hold this position for __________ seconds.  Repeat __________ times. Complete this exercise __________ times a day. Strengthening exercises These exercises build strength and endurance in your hip and pelvis. Endurance is the ability to use your muscles for a long time, even after they get tired. Straight leg raises, side-lying This exercise strengthens the muscles that rotate the leg at the hip and move it away from your body (hip abductors). 1. Lie on your side with your left / right leg in the top position. Lie so your head, shoulder, hip, and knee line up. You may bend your bottom knee to help you balance. 2. Roll your hips slightly forward so your hips are stacked directly over each other and your left / right knee is facing forward. 3. Tense the muscles in your outer thigh and lift your top leg 4-6 inches (10-15 cm). 4. Hold this position for __________ seconds. 5. Slowly return to the starting position. Let your muscles relax completely before doing another repetition. Repeat __________ times. Complete this exercise __________ times a day. Leg raises, prone This exercise strengthens the muscles that move the hips (hip extensors). 1. Lie on your abdomen on your bed or a firm surface. You can put a pillow under your hips if that is more comfortable for your lower back. 2. Bend your left / right knee so your foot is straight up in the air. 3. Squeeze your buttocks muscles and lift your left / right thigh   off the bed. Do not let your back arch. 4. Tense your thigh muscle as hard as you can without increasing any knee pain. 5. Hold this position for __________ seconds. 6. Slowly lower your leg to the starting position and allow it to relax completely. Repeat __________ times. Complete this exercise __________ times a day. Hip hike 1. Stand sideways on a bottom step. Stand on your left / right leg with your other foot unsupported next to the step. You can hold on to the railing or wall for balance if needed. 2. Keep your knees straight  and your torso square. Then lift your left / right hip up toward the ceiling. 3. Slowly let your left / right hip lower toward the floor, past the starting position. Your foot should get closer to the floor. Do not lean or bend your knees. Repeat __________ times. Complete this exercise __________ times a day. This information is not intended to replace advice given to you by your health care provider. Make sure you discuss any questions you have with your health care provider. Document Released: 06/16/2005 Document Revised: 10/07/2018 Document Reviewed: 04/07/2018 Elsevier Patient Education  2020 [employer]. Hip Bursitis  Hip bursitis is swelling of a fluid-filled sac (bursa) in your hip joint. This swelling (inflammation) can be painful. This condition may come and go over time. What are the causes?  Injury to the hip.  Overuse of the muscles that surround the hip joint.  An earlier injury or surgery of the hip.  Arthritis or gout.  Diabetes.  Thyroid disease.  Infection.  In some cases, the cause may not be known. What are the signs or symptoms?  Mild or moderate pain in the hip area. Pain may get worse with movement.  Tenderness and swelling of the hip, especially on the outer side of the hip.  In rare cases, the bursa may become infected. This may cause: ? A fever. ? Warmth and redness in the area. Symptoms may come and go. How is this treated? This condition is treated by resting, icing, applying pressure (compression), and raising (elevating) the injured area. You may hear this called the RICE treatment. Treatment may also include:  Using crutches.  Draining fluid out of the bursa to help relieve swelling.  Giving a shot of (injecting) medicine that helps to reduce swelling (cortisone).  Other medicines if the bursa is infected. Follow these instructions at home: Managing pain, stiffness, and swelling   If told, put ice on the painful area. ? Put ice in a  plastic bag. ? Place a towel between your skin and the bag. ? Leave the ice on for 20 minutes, 2-3 times a day. ? Raise (elevate) your hip above the level of your heart as much as you can without pain. To do this, try putting a pillow under your hips while you lie down. Stop if this causes pain. Activity  Return to your normal activities as told by your doctor. Ask your doctor what activities are safe for you.  Rest and protect your hip as much as you can until you feel better. General instructions  Take over-the-counter and prescription medicines only as told by your doctor.  Wear wraps that put pressure on your hip (compression wraps) only as told by your doctor.  Do not use your hip to support your body weight until your doctor says that you can.  Use crutches as told by your doctor.  Gently rub and stretch your injured area as often  as is comfortable.  Keep all follow-up visits as told by your doctor. This is important. How is this prevented?  Exercise regularly, as told by your doctor.  Warm up and stretch before being active.  Cool down and stretch after being active.  Avoid activities that bother your hip or cause pain.  Avoid sitting down for long periods at a time. Contact a doctor if:  You have a fever.  You get new symptoms.  You have trouble walking.  You have trouble doing everyday activities.  You have pain that gets worse.  You have pain that does not get better with medicine.  You get red skin on your hip area.  You get a feeling of warmth in your hip area. Get help right away if:  You cannot move your hip.  You have very bad pain. Summary  Hip bursitis is swelling of a fluid-filled sac (bursa) in your hip.  Hip bursitis can be painful.  Symptoms often come and go over time.  This condition is treated with rest, ice, compression, elevation, and medicines. This information is not intended to replace advice given to you by your health care  provider. Make sure you discuss any questions you have with your health care provider. Document Released: 07/19/2010 Document Revised: 02/22/2018 Document Reviewed: 02/22/2018 Elsevier Patient Education  2020 [employer].

## 2019-06-10 ENCOUNTER — Other Ambulatory Visit: Payer: Self-pay | Admitting: Rheumatology

## 2019-06-10 NOTE — Telephone Encounter (Signed)
Last Visit: 03/08/2019 Next Visit: 09/05/2019  Okay to refill per Dr. Estanislado Pandy.

## 2019-08-29 NOTE — Progress Notes (Signed)
Office Visit Note  Patient: Whitney Ward             Date of Birth: [birth year]           MRN: [id number]             PCP: Rocky Morel, MD Referring: Huey Romans* Visit Date: 09/05/2019 Occupation: @GUAROCC @  Subjective:  Generalized pain   History of Present Illness: Whitney Ward is a 69 y.o. female with history of fibromyalgia and osteoarthritis.  She reports that she has a fibromyalgia flare about once a month.  She continues have generalized muscle aches and muscle tenderness. She continues to take baclofen 10 mg 3 times daily as needed for muscle spasms.  She requested a refill of baclofen today. She states that the trochanteric bursitis bilaterally resolved after the cortisone injections in September 2020.  She continues to have intermittent pain in the right knee joint but denies any joint swelling.  She states that her right shoulder joint pain has resolved.  She denies any pain or swelling in her hands at this time.  She continues to have chronic fatigue secondary to insomnia.  She takes Klonopin 1 mg 1 tablet by mouth at bedtime for anxiety and insomnia. She takes Trazodone 50 mg 1 tablet by mouth at bedtime.    Activities of Daily Living:  Patient reports morning stiffness for  20-30 minutes.   Patient Reports nocturnal pain.  Difficulty dressing/grooming: Denies Difficulty climbing stairs: Reports Difficulty getting out of chair: Reports Difficulty using hands for taps, buttons, cutlery, and/or writing: Denies  Review of Systems  Constitutional: Positive for fatigue.  HENT: Negative for mouth sores, mouth dryness and nose dryness.   Eyes: Negative for pain, visual disturbance and dryness.  Respiratory: Negative for cough, hemoptysis, shortness of breath and difficulty breathing.   Cardiovascular: Negative for chest pain, palpitations, hypertension and swelling in legs/feet.  Gastrointestinal: Negative for blood in stool, constipation  and diarrhea.  Endocrine: Negative for increased urination.  Genitourinary: Negative for painful urination.  Musculoskeletal: Positive for arthralgias, joint pain, morning stiffness and muscle tenderness. Negative for joint swelling, myalgias, muscle weakness and myalgias.  Skin: Negative for color change, pallor, rash, hair loss, nodules/bumps, skin tightness, ulcers and sensitivity to sunlight.  Allergic/Immunologic: Negative for susceptible to infections.  Neurological: Positive for weakness. Negative for dizziness, numbness and headaches.  Hematological: Negative for swollen glands.  Psychiatric/Behavioral: Positive for depressed mood. Negative for sleep disturbance. The patient is nervous/anxious.     PMFS History:  Patient Active Problem List   Diagnosis Date Noted  . Fibromyalgia 10/09/2016  . Primary insomnia 10/09/2016  . Other fatigue 10/09/2016  . Tendinopathy of right shoulder 10/09/2016  . Trochanteric bursitis of right hip 10/09/2016  . Primary osteoarthritis of both hands 10/09/2016  . Chronic kidney disease 10/09/2016  . Essential hypertension 10/09/2016  . Gastroesophageal reflux disease 10/09/2016  . Restless leg syndrome 10/09/2016  . Malignant neoplasm of female breast ([place]) 10/09/2016  . History of breast cancer 10/09/2016  . Genital herpes simplex 10/09/2016    Past Medical History:  Diagnosis Date  . Arthritis   . Breast cancer ([place])   . Breast cancer ([place])   . Broken toe   . Fibromyalgia   . Hypertension     History reviewed. No pertinent family history. Past Surgical History:  Procedure Laterality Date  . ABDOMINAL HYSTERECTOMY    . BREAST SURGERY    . CESAREAN SECTION    . SMALL  INTESTINE SURGERY     Social History   Social History Narrative  . Not on file    There is no immunization history on file for this patient.   Objective: Vital Signs: BP (!) 147/87 (BP Location: Right Arm, Patient Position: Sitting, Cuff Size: Normal)   Pulse 68    Resp 15   Ht 6' (1.829 m)   Wt 224 lb (101.6 kg)   BMI 30.38 kg/m    Physical Exam Vitals and nursing note reviewed.  Constitutional:      Appearance: She is well-developed.  HENT:     Head: Normocephalic and atraumatic.  Eyes:     Conjunctiva/sclera: Conjunctivae normal.  Pulmonary:     Effort: Pulmonary effort is normal.  Abdominal:     General: Bowel sounds are normal.     Palpations: Abdomen is soft.  Musculoskeletal:     Cervical back: Normal range of motion.  Lymphadenopathy:     Cervical: No cervical adenopathy.  Skin:    General: Skin is warm and dry.     Capillary Refill: Capillary refill takes less than 2 seconds.  Neurological:     Mental Status: She is alert and oriented to person, place, and time.  Psychiatric:        Behavior: Behavior normal.      Musculoskeletal Exam: C-spine, thoracic spine, lumbar spine good range of motion.  No midline spinal tenderness.  No SI joint tenderness.  Shoulder joints, elbow joints, wrist joints, MCPs, PIPs and DIPs good range of motion no synovitis.  She has complete fist formation bilaterally.  Hip joints, knee joints, ankle joints, MTPs, PIPs and DIPs good range of motion with no synovitis.  No warmth or effusion of bilateral knee joints.  No tenderness or swelling of ankle joints.  No tenderness over trochanteric bursa bilaterally.  CDAI Exam: CDAI Score: -- Patient Global: --; Provider Global: -- Swollen: --; Tender: -- Joint Exam 09/05/2019   No joint exam has been documented for this visit   There is currently no information documented on the homunculus. Go to the Rheumatology activity and complete the homunculus joint exam.  Investigation: No additional findings.  Imaging: No results found.  Recent Labs: Lab Results  Component Value Date   WBC 4.8 09/01/2018   HGB 12.5 09/01/2018   PLT 258 09/01/2018   NA 143 09/01/2018   K 3.7 09/01/2018   CL 105 09/01/2018   CO2 30 09/01/2018   GLUCOSE 95  09/01/2018   BUN 13 09/01/2018   CREATININE 1.01 (H) 09/01/2018   BILITOT 0.6 09/01/2018   ALKPHOS 99 10/15/2016   AST 21 09/01/2018   ALT 16 09/01/2018   PROT 7.1 09/01/2018   ALBUMIN 4.4 10/15/2016   CALCIUM 9.5 09/01/2018   GFRAA 66 09/01/2018    Speciality Comments: No specialty comments available.  Procedures:  No procedures performed Allergies: Codeine, Penicillins, Sulfa antibiotics, Tramadol, Aromasin [exemestane], Atorvastatin, Celexa [citalopram hydrobromide], Citalopram, and Tamoxifen   Assessment / Plan:     Visit Diagnoses: Fibromyalgia - She has generalized hyperalgesia and positive muscles on exam.  She has been having a fibromyalgia flare about once a month which typically last 3 to 5 days.  She experiences generalized myalgias and muscle tenderness.  She has not had any increased muscle weakness recently.  She takes baclofen 10 mg 3 times daily for muscle spasms.  A refill of baclofen was sent to the pharmacy today.  She performs yoga on a regular basis.  We discussed  the importance of regular exercise.  She continues to have chronic fatigue secondary to insomnia.  She has been taking trazodone 50 mg 1 tablet by mouth at bedtime and Klonopin 1 mg by mouth at bedtime as needed for anxiety and insomnia.  She continues to take gabapentin as prescribed.  She will follow-up in the office in 6 months.  Other fatigue: She has chronic fatigue secondary to insomnia.  Discussed the importance of regular exercise and good sleep hygiene.  Primary insomnia: She takes trazodone 50 mg 1 tablet by mouth at bedtime to help her sleep.  Good sleep hygiene was discussed.  Tendinopathy of right shoulder: Resolved.  She has good range of motion of the right shoulder joint on exam.  No tenderness was noted.  Trochanteric bursitis of both hips: Resolved.  She had no tenderness to palpation.  She had bilateral greater trochanteric bursa cortisone injections on 03/08/2019 which provided significant  relief.  She was encouraged to continue to perform stretching exercises daily.  She has been performing yoga exercises on a regular basis.  Primary osteoarthritis of both hands: She has mild PIP and DIP thickening.  She has complete fist formation bilaterally.  No tenderness or synovitis was noted.  She has no discomfort in her hands at this time.  Joint protection and muscle strengthening were discussed. She has noticed a progressively worsening tremor in both hands.  She declined referral to neurology at this time.  She was advised to notify us if the tremor worsens or if she develops any new symptoms.  Elevated CK - CK 251 on 09/01/2018. Myositis panel negative and aldolase within normal limits on 09/03/2018.  She has not had any increased muscle weakness.  She continues to have myalgias related to fibromyalgia.  We will recheck CK today.  She was advised to notify us if she develops any new or worsening symptoms.- Plan: CK  Other medical conditions are listed as follows:   History of gastroesophageal reflux (GERD)  History of breast cancer  History of hypertension  Restless leg syndrome  Orders: Orders Placed This Encounter  Procedures  . CK   Meds ordered this encounter  Medications  . baclofen (LIORESAL) 10 MG tablet    Sig: Take 1 tablet (10 mg total) by mouth 3 (three) times daily.    Dispense:  270 tablet    Refill:  0      Follow-Up Instructions: Return in about 6 months (around 03/07/2020) for Fibromyalgia, Osteoarthritis.   Ofilia Neas, PA-C  Note - This record has been created using Dragon software.  Chart creation errors have been sought, but may not always  have been located. Such creation errors do not reflect on  the standard of medical care.

## 2019-09-05 ENCOUNTER — Other Ambulatory Visit: Payer: Self-pay

## 2019-09-05 ENCOUNTER — Encounter: Payer: Self-pay | Admitting: Physician Assistant

## 2019-09-05 ENCOUNTER — Ambulatory Visit: Payer: Medicare Other | Admitting: Physician Assistant

## 2019-09-05 VITALS — BP 147/87 | HR 68 | Resp 15 | Ht 72.0 in | Wt 224.0 lb

## 2019-09-05 DIAGNOSIS — M67911 Unspecified disorder of synovium and tendon, right shoulder: Secondary | ICD-10-CM

## 2019-09-05 DIAGNOSIS — Z8719 Personal history of other diseases of the digestive system: Secondary | ICD-10-CM

## 2019-09-05 DIAGNOSIS — Z8679 Personal history of other diseases of the circulatory system: Secondary | ICD-10-CM

## 2019-09-05 DIAGNOSIS — M797 Fibromyalgia: Secondary | ICD-10-CM

## 2019-09-05 DIAGNOSIS — F5101 Primary insomnia: Secondary | ICD-10-CM

## 2019-09-05 DIAGNOSIS — M19042 Primary osteoarthritis, left hand: Secondary | ICD-10-CM

## 2019-09-05 DIAGNOSIS — M19041 Primary osteoarthritis, right hand: Secondary | ICD-10-CM

## 2019-09-05 DIAGNOSIS — M7061 Trochanteric bursitis, right hip: Secondary | ICD-10-CM

## 2019-09-05 DIAGNOSIS — M7062 Trochanteric bursitis, left hip: Secondary | ICD-10-CM

## 2019-09-05 DIAGNOSIS — R5383 Other fatigue: Secondary | ICD-10-CM

## 2019-09-05 DIAGNOSIS — R748 Abnormal levels of other serum enzymes: Secondary | ICD-10-CM

## 2019-09-05 DIAGNOSIS — Z853 Personal history of malignant neoplasm of breast: Secondary | ICD-10-CM

## 2019-09-05 DIAGNOSIS — G2581 Restless legs syndrome: Secondary | ICD-10-CM

## 2019-09-05 MED ORDER — BACLOFEN 10 MG PO TABS: 10.0000 mg | ORAL_TABLET | Freq: Three times a day (TID) | ORAL | 0 refills | Status: DC

## 2019-09-06 LAB — CK: Total CK: 299 U/L — ABNORMAL HIGH (ref 29–143)

## 2019-09-06 NOTE — Progress Notes (Signed)
CK remains elevated-299.  She was not experiencing any muscle weakness on examination on 09/05/2019.  She continues to have generalized myalgias related to underlying fibromyalgia.  Please advise the patient to notify us if she develops increased muscle weakness.  Her aldolase and myositis panel have been negative in the past.

## 2019-12-07 ENCOUNTER — Other Ambulatory Visit: Payer: Self-pay | Admitting: Physician Assistant

## 2019-12-07 NOTE — Telephone Encounter (Signed)
Last Visit: 09/05/2019 Next visit: 12/06/2019  Okay to refill per Dr. Estanislado Pandy

## 2020-02-23 NOTE — Progress Notes (Deleted)
Office Visit Note  Patient: Whitney Ward             Date of Birth: Dec 17, 1950           MRN: [id number]             PCP: Rocky Morel, MD Referring: Huey Romans* Visit Date: 03/07/2020 Occupation: @GUAROCC @  Subjective:    History of Present Illness: Whitney Ward is a 69 y.o. female with history of fibromyalgia and osteoarthritis. She takes klonopin 1 mg po at bedtime for anxiety and insomnia, baclofen TID PRN for muscle spasms, and gabapentin as prescribed.   Activities of Daily Living:  Patient reports morning stiffness for   {minute/hour:19697}.   Patient {ACTIONS;DENIES/REPORTS:21021675::"Denies"} nocturnal pain.  Difficulty dressing/grooming: {ACTIONS;DENIES/REPORTS:21021675::"Denies"} Difficulty climbing stairs: {ACTIONS;DENIES/REPORTS:21021675::"Denies"} Difficulty getting out of chair: {ACTIONS;DENIES/REPORTS:21021675::"Denies"} Difficulty using hands for taps, buttons, cutlery, and/or writing: {ACTIONS;DENIES/REPORTS:21021675::"Denies"}  Review of Systems  Constitutional: Negative for fatigue.  HENT: Negative for mouth sores, mouth dryness and nose dryness.   Eyes: Negative for pain, visual disturbance and dryness.  Respiratory: Negative for cough, hemoptysis, shortness of breath and difficulty breathing.   Cardiovascular: Negative for chest pain, palpitations, hypertension and swelling in legs/feet.  Gastrointestinal: Negative for blood in stool, constipation and diarrhea.  Endocrine: Negative for increased urination.  Genitourinary: Negative for painful urination.  Musculoskeletal: Negative for arthralgias, joint pain, joint swelling, myalgias, muscle weakness, morning stiffness, muscle tenderness and myalgias.  Skin: Negative for color change, pallor, rash, hair loss, nodules/bumps, skin tightness, ulcers and sensitivity to sunlight.  Allergic/Immunologic: Negative for susceptible to infections.  Neurological: Negative for  dizziness, numbness, headaches and weakness.  Hematological: Negative for swollen glands.  Psychiatric/Behavioral: Negative for depressed mood and sleep disturbance. The patient is not nervous/anxious.     PMFS History:  Patient Active Problem List   Diagnosis Date Noted  . Fibromyalgia 10/09/2016  . Primary insomnia 10/09/2016  . Other fatigue 10/09/2016  . Tendinopathy of right shoulder 10/09/2016  . Trochanteric bursitis of right hip 10/09/2016  . Primary osteoarthritis of both hands 10/09/2016  . Chronic kidney disease 10/09/2016  . Essential hypertension 10/09/2016  . Gastroesophageal reflux disease 10/09/2016  . Restless leg syndrome 10/09/2016  . Malignant neoplasm of female breast ([place]) 10/09/2016  . History of breast cancer 10/09/2016  . Genital herpes simplex 10/09/2016    Past Medical History:  Diagnosis Date  . Arthritis   . Breast cancer ([place])   . Breast cancer ([place])   . Broken toe   . Fibromyalgia   . Hypertension     No family history on file. Past Surgical History:  Procedure Laterality Date  . ABDOMINAL HYSTERECTOMY    . BREAST SURGERY    . CESAREAN SECTION    . SMALL INTESTINE SURGERY     Social History   Social History Narrative  . Not on file    There is no immunization history on file for this patient.   Objective: Vital Signs: There were no vitals taken for this visit.   Physical Exam Vitals and nursing note reviewed.  Constitutional:      Appearance: She is well-developed.  HENT:     Head: Normocephalic and atraumatic.  Eyes:     Conjunctiva/sclera: Conjunctivae normal.  Pulmonary:     Effort: Pulmonary effort is normal.  Abdominal:     Palpations: Abdomen is soft.  Musculoskeletal:     Cervical back: Normal range of motion.  Skin:    General: Skin is warm and  dry.     Capillary Refill: Capillary refill takes less than 2 seconds.  Neurological:     Mental Status: She is alert and oriented to person, place, and time.    Psychiatric:        Behavior: Behavior normal.      Musculoskeletal Exam:    CDAI Exam: CDAI Score: -- Patient Global: --; Provider Global: -- Swollen: --; Tender: -- Joint Exam 03/07/2020   No joint exam has been documented for this visit   There is currently no information documented on the homunculus. Go to the Rheumatology activity and complete the homunculus joint exam.  Investigation: No additional findings.  Imaging: No results found.  Recent Labs: Lab Results  Component Value Date   WBC 4.8 09/01/2018   HGB 12.5 09/01/2018   PLT 258 09/01/2018   NA 143 09/01/2018   K 3.7 09/01/2018   CL 105 09/01/2018   CO2 30 09/01/2018   GLUCOSE 95 09/01/2018   BUN 13 09/01/2018   CREATININE 1.01 (H) 09/01/2018   BILITOT 0.6 09/01/2018   ALKPHOS 99 10/15/2016   AST 21 09/01/2018   ALT 16 09/01/2018   PROT 7.1 09/01/2018   ALBUMIN 4.4 10/15/2016   CALCIUM 9.5 09/01/2018   GFRAA 66 09/01/2018    Speciality Comments: No specialty comments available.  Procedures:  No procedures performed Allergies: Codeine, Penicillins, Sulfa antibiotics, Tramadol, Aromasin [exemestane], Atorvastatin, Celexa [citalopram hydrobromide], Citalopram, and Tamoxifen   Assessment / Plan:     Visit Diagnoses: Fibromyalgia - baclofen, klonopin  Primary insomnia - trazodone 50 mg 1 tablet by mouth at bedtime to help her sleep.    Other fatigue  Tendinopathy of right shoulder - Resolved.   Trochanteric bursitis of both hips - Resolved.   Primary osteoarthritis of both hands  Elevated CK - CK 251 on 09/01/2018. Myositis panel negative and aldolase within normal limits on 09/03/2018.   History of breast cancer  History of gastroesophageal reflux (GERD)  Restless leg syndrome  History of hypertension  Orders: No orders of the defined types were placed in this encounter.  No orders of the defined types were placed in this encounter.   Face-to-face time spent with patient was ***  minutes. Greater than 50% of time was spent in counseling and coordination of care.  Follow-Up Instructions: No follow-ups on file.   Ofilia Neas, PA-C  Note - This record has been created using Dragon software.  Chart creation errors have been sought, but may not always  have been located. Such creation errors do not reflect on  the standard of medical care.

## 2020-03-07 ENCOUNTER — Ambulatory Visit: Payer: Medicare Other | Admitting: Physician Assistant

## 2020-03-17 ENCOUNTER — Other Ambulatory Visit: Payer: Self-pay | Admitting: Physician Assistant

## 2020-03-19 NOTE — Telephone Encounter (Signed)
Please schedule patient for a follow up visit. Patient due September 2021. Thanks!

## 2020-03-19 NOTE — Telephone Encounter (Signed)
Last Visit: 09/05/2019 Next visit: was due September 2021.message sent to the front to schedule patient.   Last Fill: 12/07/2019  Okay to refill Baclofen?

## 2020-03-20 NOTE — Progress Notes (Signed)
Office Visit Note  Patient: Whitney Ward             Date of Birth: [birth year]           MRN: [id number]             PCP: Whitney Morel, MD Referring: Whitney Ward* Visit Date: 03/21/2020 Occupation: @GUAROCC @  Subjective:  Tremors   History of Present Illness: Whitney Ward is a 69 y.o. female with history of fibromyalgia and osteoarthritis. Patient continues to experience generalized myalgias and muscle tenderness due to fibromyalgia. She takes baclofen 10 mg twice daily for muscle spasms. She has chronic fatigue secondary to insomnia. She experiences interrupted sleep at night due to nocturnal pain.She has been sleeping about 6 hours nightly. She continues to take Klonopin 1 mg 1 tablet by mouth at bedtime. She experiences discomfort when lying on her sides at night due to trochanteric bursitis bilaterally. Her discomfort has been tolerable overall and she does not need cortisone injections today.  According to the patient she has been experiencing increased intentional tremors as well as at rest. She has also noticed an increased issue with balance. She has not had any recent falls but feels as though she is unstable on her feet.   Activities of Daily Living:  Patient reports morning stiffness for several hours.   Patient Reports nocturnal pain.  Difficulty dressing/grooming: Denies Difficulty climbing stairs: Reports Difficulty getting out of chair: Reports Difficulty using hands for taps, buttons, cutlery, and/or writing: Denies  Review of Systems  Constitutional: Positive for fatigue.  HENT: Negative for mouth sores, mouth dryness and nose dryness.   Eyes: Negative for pain, itching and dryness.  Respiratory: Negative for shortness of breath and difficulty breathing.   Cardiovascular: Negative for chest pain and palpitations.  Gastrointestinal: Negative for blood in stool, constipation and diarrhea.  Endocrine: Negative for increased  urination.  Genitourinary: Negative for difficulty urinating.  Musculoskeletal: Positive for arthralgias, joint pain, myalgias, morning stiffness, muscle tenderness and myalgias. Negative for joint swelling.  Skin: Negative for color change, rash and redness.  Allergic/Immunologic: Negative for susceptible to infections.  Neurological: Positive for tremors, headaches and memory loss. Negative for dizziness, numbness and weakness.  Hematological: Negative for bruising/bleeding tendency.  Psychiatric/Behavioral: Negative for confusion and sleep disturbance.    PMFS History:  Patient Active Problem List   Diagnosis Date Noted  . Fibromyalgia 10/09/2016  . Primary insomnia 10/09/2016  . Other fatigue 10/09/2016  . Tendinopathy of right shoulder 10/09/2016  . Trochanteric bursitis of right hip 10/09/2016  . Primary osteoarthritis of both hands 10/09/2016  . Chronic kidney disease 10/09/2016  . Essential hypertension 10/09/2016  . Gastroesophageal reflux disease 10/09/2016  . Restless leg syndrome 10/09/2016  . Malignant neoplasm of female breast ([place]) 10/09/2016  . History of breast cancer 10/09/2016  . Genital herpes simplex 10/09/2016    Past Medical History:  Diagnosis Date  . Arthritis   . Breast cancer ([place])   . Breast cancer ([place])   . Broken toe   . Fibromyalgia   . Hypertension     History reviewed. No pertinent family history. Past Surgical History:  Procedure Laterality Date  . ABDOMINAL HYSTERECTOMY    . BREAST SURGERY    . CESAREAN SECTION    . SMALL INTESTINE SURGERY     Social History   Social History Narrative  . Not on file   Immunization History  Administered Date(s) Administered  . Moderna SARS-COVID-2 Vaccination 07/07/2019, 08/04/2019  Objective: Vital Signs: BP (!) 145/84 (BP Location: Right Arm, Patient Position: Sitting, Cuff Size: Normal)   Pulse 73   Resp 14   Ht 6' (1.829 m)   Wt 222 lb 6.4 oz (100.9 kg)   BMI 30.16 kg/m     Physical Exam Vitals and nursing note reviewed.  Constitutional:      Appearance: She is well-developed.  HENT:     Head: Normocephalic and atraumatic.  Eyes:     Conjunctiva/sclera: Conjunctivae normal.  Pulmonary:     Effort: Pulmonary effort is normal.  Abdominal:     Palpations: Abdomen is soft.  Musculoskeletal:     Cervical back: Normal range of motion.  Skin:    General: Skin is warm and dry.     Capillary Refill: Capillary refill takes less than 2 seconds.  Neurological:     Mental Status: She is alert and oriented to person, place, and time.  Psychiatric:        Behavior: Behavior normal.      Musculoskeletal Exam: Generalized hyperalgesia and positive tender points. C-spine, thoracic spine, and lumbar spine good ROM. Trapezius muscle tension and tenderness bilaterally.  Shoulder joints, elbow joints, wrist joints, MCPs, PIPs, and DIPs good ROM with no synovitis.  Complete fist formation bilaterally.  PIP and DIP thickening consistent with osteoarthritis of both hands. Hip joints good ROM with no discomfort.  Tenderness over bilateral trochanteric bursa. Knee joints and ankle joints good ROM with no discomfort.  No warmth or effusion of knee joints or ankle joints noted.  CDAI Exam: CDAI Score: -- Patient Global: --; Provider Global: -- Swollen: --; Tender: -- Joint Exam 03/21/2020   No joint exam has been documented for this visit   There is currently no information documented on the homunculus. Go to the Rheumatology activity and complete the homunculus joint exam.  Investigation: No additional findings.  Imaging: No results found.  Recent Labs: Lab Results  Component Value Date   WBC 4.8 09/01/2018   HGB 12.5 09/01/2018   PLT 258 09/01/2018   NA 143 09/01/2018   K 3.7 09/01/2018   CL 105 09/01/2018   CO2 30 09/01/2018   GLUCOSE 95 09/01/2018   BUN 13 09/01/2018   CREATININE 1.01 (H) 09/01/2018   BILITOT 0.6 09/01/2018   ALKPHOS 99 10/15/2016    AST 21 09/01/2018   ALT 16 09/01/2018   PROT 7.1 09/01/2018   ALBUMIN 4.4 10/15/2016   CALCIUM 9.5 09/01/2018   GFRAA 66 09/01/2018    Speciality Comments: No specialty comments available.  Procedures:  No procedures performed Allergies: Codeine, Penicillins, Sulfa antibiotics, Tramadol, Aromasin [exemestane], Atorvastatin, Celexa [citalopram hydrobromide], Citalopram, and Tamoxifen   Assessment / Plan:     Visit Diagnoses: Fibromyalgia: She has generalized hyperalgesia and positive tender points on exam.  She continues to experience frequent fibromyalgia flares.  She takes baclofen 10 mg twice daily for muscle spasms and will attempt to try reducing the dose and taking it as needed.  She continues to take gabapentin as prescribed.  She is also taking coenzyme Q 1030 mg daily.  She has chronic fatigue secondary to insomnia.  She has been having increased nocturnal pain due to generalized myalgias and trochanteric bursitis bilaterally.  She was encouraged to continue to perform stretching exercises on a daily basis and was advised to notify us if she would like to return for a cortisone injection in the future.  We discussed the importance of regular exercise and good sleep hygiene.  She will follow-up in the office in 6 months.   Other fatigue: She has chronic fatigue secondary to insomnia.  She has been sleeping about 6 hours at night which has been interrupted.  We discussed the importance of regular exercise and good sleep hygiene.  Primary insomnia: She takes Klonopin 1 mg 1 tablet by mouth at bedtime and trazodone as needed for insomnia.  She has difficulty staying asleep due to nocturnal pain.  Good sleep hygiene was discussed.  Tendinopathy of right shoulder: She has good range of motion of the right shoulder joint on exam.  She has some tenderness to palpation on exam.  Trochanteric bursitis of both hips: She has tenderness palpation over bilateral trochanteric bursa.  Her discomfort  has been tolerable overall.  She has been performing stretching exercises on a daily basis.  She declined cortisone injections today but was advised to notify us if her symptoms persist or worsen and she would like to return for an injection in the future.  She was encouraged to perform stretching exercises every day.  Primary osteoarthritis of both hands: She has PIP and DIP thickening consistent with osteoarthritis of both hands.  No tenderness or inflammation was noted on exam.  She is able to make a complete fist bilaterally.  Joint protection and muscle strengthening were discussed.  Occasional tremors: She has been experiencing an increase in intention tremors and tremors at rest recently.  She has been unable to identify a trigger.  A referral to neurology will be placed today for further evaluation and management.  Balance problems: She has been experiencing increased instability and issues with balance recently.  She has not had any falls but feels as though she is going to while at work.  A referral to neurology will be placed today.  Elevated CK: CK was 299 on 09/05/2019.  Myositis panel was negative in the past as well as aldolase on 09/03/2018.  She has a chronic elevation in CK.  She has not had any signs or symptoms of myositis.  She is not experiencing any muscle weakness at this time.  Other medical conditions are listed as follows:   History of gastroesophageal reflux (GERD)  History of breast cancer  History of hypertension  Restless leg syndrome  Orders: No orders of the defined types were placed in this encounter.  No orders of the defined types were placed in this encounter.    Follow-Up Instructions: Return in about 6 months (around 09/18/2020) for Fibromyalgia, Osteoarthritis.   Ofilia Neas, PA-C  Note - This record has been created using Dragon software.  Chart creation errors have been sought, but may not always  have been located. Such creation errors do not  reflect on  the standard of medical care.

## 2020-03-20 NOTE — Telephone Encounter (Signed)
I called patient.  And discussed that she may want to get back on the muscle relaxer.  She is willing to reduce it to twice a day and see how she tolerates it.  Once she can taper it we might be able to reduce it to once a day.  Increased risk of dizziness and drowsiness was discussed.

## 2020-03-21 ENCOUNTER — Other Ambulatory Visit: Payer: Self-pay

## 2020-03-21 ENCOUNTER — Encounter: Payer: Self-pay | Admitting: Physician Assistant

## 2020-03-21 ENCOUNTER — Ambulatory Visit: Payer: Medicare Other | Admitting: Physician Assistant

## 2020-03-21 VITALS — BP 145/84 | HR 73 | Resp 14 | Ht 72.0 in | Wt 222.4 lb

## 2020-03-21 DIAGNOSIS — R2689 Other abnormalities of gait and mobility: Secondary | ICD-10-CM

## 2020-03-21 DIAGNOSIS — M19042 Primary osteoarthritis, left hand: Secondary | ICD-10-CM

## 2020-03-21 DIAGNOSIS — R748 Abnormal levels of other serum enzymes: Secondary | ICD-10-CM

## 2020-03-21 DIAGNOSIS — F5101 Primary insomnia: Secondary | ICD-10-CM | POA: Diagnosis not present

## 2020-03-21 DIAGNOSIS — M67911 Unspecified disorder of synovium and tendon, right shoulder: Secondary | ICD-10-CM | POA: Diagnosis not present

## 2020-03-21 DIAGNOSIS — Z8719 Personal history of other diseases of the digestive system: Secondary | ICD-10-CM

## 2020-03-21 DIAGNOSIS — G2581 Restless legs syndrome: Secondary | ICD-10-CM

## 2020-03-21 DIAGNOSIS — M7061 Trochanteric bursitis, right hip: Secondary | ICD-10-CM

## 2020-03-21 DIAGNOSIS — M797 Fibromyalgia: Secondary | ICD-10-CM

## 2020-03-21 DIAGNOSIS — Z853 Personal history of malignant neoplasm of breast: Secondary | ICD-10-CM

## 2020-03-21 DIAGNOSIS — M7062 Trochanteric bursitis, left hip: Secondary | ICD-10-CM

## 2020-03-21 DIAGNOSIS — R251 Tremor, unspecified: Secondary | ICD-10-CM

## 2020-03-21 DIAGNOSIS — Z8679 Personal history of other diseases of the circulatory system: Secondary | ICD-10-CM

## 2020-03-21 DIAGNOSIS — R5383 Other fatigue: Secondary | ICD-10-CM | POA: Diagnosis not present

## 2020-03-21 DIAGNOSIS — M19041 Primary osteoarthritis, right hand: Secondary | ICD-10-CM

## 2020-03-22 ENCOUNTER — Ambulatory Visit: Payer: Medicare Other | Admitting: Physician Assistant

## 2020-03-27 ENCOUNTER — Telehealth: Payer: Self-pay

## 2020-03-27 NOTE — Telephone Encounter (Signed)
Patient left a voicemail requesting a return call regarding her appointment last week.

## 2020-03-27 NOTE — Telephone Encounter (Signed)
Patient states she was supposed to get a referral to neurology when she was in for her last appointment. Patient advised referral has been placed. Patient provided with phone number to [facility] so she may contact them to set up an appointment.

## 2020-05-14 ENCOUNTER — Other Ambulatory Visit: Payer: Self-pay

## 2020-05-14 ENCOUNTER — Ambulatory Visit: Payer: Medicare Other | Admitting: Physician Assistant

## 2020-05-14 ENCOUNTER — Encounter: Payer: Self-pay | Admitting: Physician Assistant

## 2020-05-14 VITALS — BP 106/67 | HR 91

## 2020-05-14 DIAGNOSIS — M7061 Trochanteric bursitis, right hip: Secondary | ICD-10-CM | POA: Diagnosis not present

## 2020-05-14 MED ORDER — TRIAMCINOLONE ACETONIDE 40 MG/ML IJ SUSP
40.0000 mg | INTRAMUSCULAR | Status: AC | PRN
  Administered 2020-05-14: 40 mg via INTRA_ARTICULAR

## 2020-05-14 MED ORDER — LIDOCAINE HCL 1 % IJ SOLN
1.5000 mL | INTRAMUSCULAR | Status: AC | PRN
  Administered 2020-05-14: 1.5 mL

## 2020-05-14 NOTE — Progress Notes (Signed)
   Procedure Note  Patient: [name]             Date of Birth: [birth year]           MRN: [id number]             Visit Date: 05/14/2020  Procedures: Visit Diagnoses:  1. Trochanteric bursitis of right hip     Large Joint Inj: R greater trochanter on 05/14/2020 1:05 PM Indications: pain Details: 27 G 1.5 in needle, lateral approach  Arthrogram: No  Medications: 40 mg triamcinolone acetonide 40 MG/ML; 1.5 mL lidocaine 1 % Aspirate: 0 mL Outcome: tolerated well, no immediate complications Procedure, treatment alternatives, risks and benefits explained, specific risks discussed. Consent was given by the patient. Immediately prior to procedure a time out was called to verify the correct patient, procedure, equipment, support staff and site/side marked as required. Patient was prepped and draped in the usual sterile fashion.     She was evaluated in the office on 03/21/20 at which time she was having discomfort due to trochanteric bursitis of both hips.  She declined injections at that time and was advised to notify us if her discomfort persisted or worsened.  She has tenderness to palpation on exam today.   She is also experiencing right shoulder joint pain.  Tenderness over the right deltoid insertion site.  She was advised to use voltaren gel or salonpas patches as needed for pain relief.  She was given a sample of a salonpas patch today to try.  Patient tolerated the procedure well today.  Aftercare was discussed.  Hazel Sams, PA-C

## 2020-09-06 NOTE — Progress Notes (Deleted)
Office Visit Note  Patient: Whitney Ward             Date of Birth: 1951-03-23           MRN: [id number]             PCP: Rocky Morel, MD Referring: Huey Romans* Visit Date: 09/19/2020 Occupation: @GUAROCC @  Subjective:  No chief complaint on file.   History of Present Illness: Whitney Ward is a 70 y.o. female ***   Activities of Daily Living:  Patient reports morning stiffness for *** {minute/hour:19697}.   Patient {ACTIONS;DENIES/REPORTS:21021675::"Denies"} nocturnal pain.  Difficulty dressing/grooming: {ACTIONS;DENIES/REPORTS:21021675::"Denies"} Difficulty climbing stairs: {ACTIONS;DENIES/REPORTS:21021675::"Denies"} Difficulty getting out of chair: {ACTIONS;DENIES/REPORTS:21021675::"Denies"} Difficulty using hands for taps, buttons, cutlery, and/or writing: {ACTIONS;DENIES/REPORTS:21021675::"Denies"}  No Rheumatology ROS completed.   PMFS History:  Patient Active Problem List   Diagnosis Date Noted  . Fibromyalgia 10/09/2016  . Primary insomnia 10/09/2016  . Other fatigue 10/09/2016  . Tendinopathy of right shoulder 10/09/2016  . Trochanteric bursitis of right hip 10/09/2016  . Primary osteoarthritis of both hands 10/09/2016  . Chronic kidney disease 10/09/2016  . Essential hypertension 10/09/2016  . Gastroesophageal reflux disease 10/09/2016  . Restless leg syndrome 10/09/2016  . Malignant neoplasm of female breast ([place]) 10/09/2016  . History of breast cancer 10/09/2016  . Genital herpes simplex 10/09/2016    Past Medical History:  Diagnosis Date  . Arthritis   . Breast cancer ([place])   . Breast cancer ([place])   . Broken toe   . Fibromyalgia   . Hypertension     No family history on file. Past Surgical History:  Procedure Laterality Date  . ABDOMINAL HYSTERECTOMY    . BREAST SURGERY    . CESAREAN SECTION    . SMALL INTESTINE SURGERY     Social History   Social History Narrative  . Not on file   Immunization  History  Administered Date(s) Administered  . Moderna Sars-Covid-2 Vaccination 07/07/2019, 08/04/2019     Objective: Vital Signs: There were no vitals taken for this visit.   Physical Exam   Musculoskeletal Exam: ***  CDAI Exam: CDAI Score: -- Patient Global: --; Provider Global: -- Swollen: --; Tender: -- Joint Exam 09/19/2020   No joint exam has been documented for this visit   There is currently no information documented on the homunculus. Go to the Rheumatology activity and complete the homunculus joint exam.  Investigation: No additional findings.  Imaging: No results found.  Recent Labs: Lab Results  Component Value Date   WBC 4.8 09/01/2018   HGB 12.5 09/01/2018   PLT 258 09/01/2018   NA 143 09/01/2018   K 3.7 09/01/2018   CL 105 09/01/2018   CO2 30 09/01/2018   GLUCOSE 95 09/01/2018   BUN 13 09/01/2018   CREATININE 1.01 (H) 09/01/2018   BILITOT 0.6 09/01/2018   ALKPHOS 99 10/15/2016   AST 21 09/01/2018   ALT 16 09/01/2018   PROT 7.1 09/01/2018   ALBUMIN 4.4 10/15/2016   CALCIUM 9.5 09/01/2018   GFRAA 66 09/01/2018    Speciality Comments: No specialty comments available.  Procedures:  No procedures performed Allergies: Codeine, Penicillins, Sulfa antibiotics, Tramadol, Aromasin [exemestane], Atorvastatin, Celexa [citalopram hydrobromide], Citalopram, and Tamoxifen   Assessment / Plan:     Visit Diagnoses: No diagnosis found.  Orders: No orders of the defined types were placed in this encounter.  No orders of the defined types were placed in this encounter.   Face-to-face time spent with patient  was *** minutes. Greater than 50% of time was spent in counseling and coordination of care.  Follow-Up Instructions: No follow-ups on file.   Earnestine Mealing, CMA  Note - This record has been created using Editor, commissioning.  Chart creation errors have been sought, but may not always  have been located. Such creation errors do not reflect on   the standard of medical care.

## 2020-09-19 ENCOUNTER — Ambulatory Visit: Payer: Medicare Other | Admitting: Rheumatology

## 2020-09-19 DIAGNOSIS — G2581 Restless legs syndrome: Secondary | ICD-10-CM

## 2020-09-19 DIAGNOSIS — R748 Abnormal levels of other serum enzymes: Secondary | ICD-10-CM

## 2020-09-19 DIAGNOSIS — Z8679 Personal history of other diseases of the circulatory system: Secondary | ICD-10-CM

## 2020-09-19 DIAGNOSIS — F5101 Primary insomnia: Secondary | ICD-10-CM

## 2020-09-19 DIAGNOSIS — M7061 Trochanteric bursitis, right hip: Secondary | ICD-10-CM

## 2020-09-19 DIAGNOSIS — Z8719 Personal history of other diseases of the digestive system: Secondary | ICD-10-CM

## 2020-09-19 DIAGNOSIS — R2689 Other abnormalities of gait and mobility: Secondary | ICD-10-CM

## 2020-09-19 DIAGNOSIS — R251 Tremor, unspecified: Secondary | ICD-10-CM

## 2020-09-19 DIAGNOSIS — M797 Fibromyalgia: Secondary | ICD-10-CM

## 2020-09-19 DIAGNOSIS — Z853 Personal history of malignant neoplasm of breast: Secondary | ICD-10-CM

## 2020-09-19 DIAGNOSIS — M67911 Unspecified disorder of synovium and tendon, right shoulder: Secondary | ICD-10-CM

## 2020-09-19 DIAGNOSIS — R5383 Other fatigue: Secondary | ICD-10-CM

## 2020-09-19 DIAGNOSIS — M19042 Primary osteoarthritis, left hand: Secondary | ICD-10-CM

## 2020-09-21 NOTE — Progress Notes (Deleted)
Office Visit Note  Patient: Whitney Ward             Date of Birth: 1950-10-02           MRN: [id number]             PCP: Rocky Morel, MD Referring: Huey Romans* Visit Date: 09/26/2020 Occupation: @GUAROCC @  Subjective:    History of Present Illness: Whitney Ward is a 70 y.o. female with history of fibromyalgia and osteoarthritis. She had a right trochanteric bursa cortisone injection on 05/14/20.    Evaluated by neuro for tremor and balance issues   Activities of Daily Living:  Patient reports morning stiffness for *** {minute/hour:19697}.   Patient {ACTIONS;DENIES/REPORTS:21021675::"Denies"} nocturnal pain.  Difficulty dressing/grooming: {ACTIONS;DENIES/REPORTS:21021675::"Denies"} Difficulty climbing stairs: {ACTIONS;DENIES/REPORTS:21021675::"Denies"} Difficulty getting out of chair: {ACTIONS;DENIES/REPORTS:21021675::"Denies"} Difficulty using hands for taps, buttons, cutlery, and/or writing: {ACTIONS;DENIES/REPORTS:21021675::"Denies"}  No Rheumatology ROS completed.   PMFS History:  Patient Active Problem List   Diagnosis Date Noted  . Fibromyalgia 10/09/2016  . Primary insomnia 10/09/2016  . Other fatigue 10/09/2016  . Tendinopathy of right shoulder 10/09/2016  . Trochanteric bursitis of right hip 10/09/2016  . Primary osteoarthritis of both hands 10/09/2016  . Chronic kidney disease 10/09/2016  . Essential hypertension 10/09/2016  . Gastroesophageal reflux disease 10/09/2016  . Restless leg syndrome 10/09/2016  . Malignant neoplasm of female breast ([place]) 10/09/2016  . History of breast cancer 10/09/2016  . Genital herpes simplex 10/09/2016    Past Medical History:  Diagnosis Date  . Arthritis   . Breast cancer ([place])   . Breast cancer ([place])   . Broken toe   . Fibromyalgia   . Hypertension     No family history on file. Past Surgical History:  Procedure Laterality Date  . ABDOMINAL HYSTERECTOMY    . BREAST SURGERY     . CESAREAN SECTION    . SMALL INTESTINE SURGERY     Social History   Social History Narrative  . Not on file   Immunization History  Administered Date(s) Administered  . Moderna Sars-Covid-2 Vaccination 07/07/2019, 08/04/2019     Objective: Vital Signs: There were no vitals taken for this visit.   Physical Exam Vitals and nursing note reviewed.  Constitutional:      Appearance: She is well-developed.  HENT:     Head: Normocephalic and atraumatic.  Eyes:     Conjunctiva/sclera: Conjunctivae normal.  Pulmonary:     Effort: Pulmonary effort is normal.  Abdominal:     Palpations: Abdomen is soft.  Musculoskeletal:     Cervical back: Normal range of motion.  Skin:    General: Skin is warm and dry.     Capillary Refill: Capillary refill takes less than 2 seconds.  Neurological:     Mental Status: She is alert and oriented to person, place, and time.  Psychiatric:        Behavior: Behavior normal.      Musculoskeletal Exam: ***  CDAI Exam: CDAI Score: -- Patient Global: --; Provider Global: -- Swollen: --; Tender: -- Joint Exam 09/26/2020   No joint exam has been documented for this visit   There is currently no information documented on the homunculus. Go to the Rheumatology activity and complete the homunculus joint exam.  Investigation: No additional findings.  Imaging: No results found.  Recent Labs: Lab Results  Component Value Date   WBC 4.8 09/01/2018   HGB 12.5 09/01/2018   PLT 258 09/01/2018   NA 143 09/01/2018  K 3.7 09/01/2018   CL 105 09/01/2018   CO2 30 09/01/2018   GLUCOSE 95 09/01/2018   BUN 13 09/01/2018   CREATININE 1.01 (H) 09/01/2018   BILITOT 0.6 09/01/2018   ALKPHOS 99 10/15/2016   AST 21 09/01/2018   ALT 16 09/01/2018   PROT 7.1 09/01/2018   ALBUMIN 4.4 10/15/2016   CALCIUM 9.5 09/01/2018   GFRAA 66 09/01/2018    Speciality Comments: No specialty comments available.  Procedures:  No procedures performed Allergies:  Codeine, Penicillins, Sulfa antibiotics, Tramadol, Aromasin [exemestane], Atorvastatin, Celexa [citalopram hydrobromide], Citalopram, and Tamoxifen   Assessment / Plan:     Visit Diagnoses: Fibromyalgia  Other fatigue  Primary insomnia  Primary osteoarthritis of both hands  Tendinopathy of right shoulder  Trochanteric bursitis of both hips  Elevated CK  History of gastroesophageal reflux (GERD)  History of breast cancer  History of hypertension  Restless leg syndrome  Occasional tremors  Balance problems  Orders: No orders of the defined types were placed in this encounter.  No orders of the defined types were placed in this encounter.     Follow-Up Instructions: No follow-ups on file.   Ofilia Neas, PA-C  Note - This record has been created using Dragon software.  Chart creation errors have been sought, but may not always  have been located. Such creation errors do not reflect on  the standard of medical care.

## 2020-09-26 ENCOUNTER — Ambulatory Visit: Payer: Medicare Other | Admitting: Physician Assistant

## 2020-09-26 DIAGNOSIS — M67911 Unspecified disorder of synovium and tendon, right shoulder: Secondary | ICD-10-CM

## 2020-09-26 DIAGNOSIS — M19042 Primary osteoarthritis, left hand: Secondary | ICD-10-CM

## 2020-09-26 DIAGNOSIS — Z8719 Personal history of other diseases of the digestive system: Secondary | ICD-10-CM

## 2020-09-26 DIAGNOSIS — R2689 Other abnormalities of gait and mobility: Secondary | ICD-10-CM

## 2020-09-26 DIAGNOSIS — M797 Fibromyalgia: Secondary | ICD-10-CM

## 2020-09-26 DIAGNOSIS — R748 Abnormal levels of other serum enzymes: Secondary | ICD-10-CM

## 2020-09-26 DIAGNOSIS — Z853 Personal history of malignant neoplasm of breast: Secondary | ICD-10-CM

## 2020-09-26 DIAGNOSIS — G2581 Restless legs syndrome: Secondary | ICD-10-CM

## 2020-09-26 DIAGNOSIS — Z8679 Personal history of other diseases of the circulatory system: Secondary | ICD-10-CM

## 2020-09-26 DIAGNOSIS — F5101 Primary insomnia: Secondary | ICD-10-CM

## 2020-09-26 DIAGNOSIS — R5383 Other fatigue: Secondary | ICD-10-CM

## 2020-09-26 DIAGNOSIS — M7062 Trochanteric bursitis, left hip: Secondary | ICD-10-CM

## 2020-09-26 DIAGNOSIS — R251 Tremor, unspecified: Secondary | ICD-10-CM

## 2021-02-07 ENCOUNTER — Other Ambulatory Visit: Payer: Self-pay

## 2021-02-07 ENCOUNTER — Emergency Department (HOSPITAL_BASED_OUTPATIENT_CLINIC_OR_DEPARTMENT_OTHER): Payer: Medicare Other | Attending: Emergency Medicine

## 2021-02-07 ENCOUNTER — Encounter (HOSPITAL_BASED_OUTPATIENT_CLINIC_OR_DEPARTMENT_OTHER): Payer: Self-pay | Admitting: *Deleted

## 2021-02-07 ENCOUNTER — Emergency Department (HOSPITAL_BASED_OUTPATIENT_CLINIC_OR_DEPARTMENT_OTHER)
Admission: EM | Admit: 2021-02-07 | Discharge: 2021-02-07 | Disposition: A | Discharge status: Home / Self Care | Payer: Worker's Compensation | Attending: Emergency Medicine | Admitting: Emergency Medicine

## 2021-02-07 DIAGNOSIS — Y990 Civilian activity done for income or pay: Secondary | ICD-10-CM | POA: Diagnosis not present

## 2021-02-07 DIAGNOSIS — I10 Essential (primary) hypertension: Secondary | ICD-10-CM | POA: Insufficient documentation

## 2021-02-07 DIAGNOSIS — Z853 Personal history of malignant neoplasm of breast: Secondary | ICD-10-CM | POA: Insufficient documentation

## 2021-02-07 DIAGNOSIS — Z79899 Other long term (current) drug therapy: Secondary | ICD-10-CM | POA: Insufficient documentation

## 2021-02-07 DIAGNOSIS — Z7982 Long term (current) use of aspirin: Secondary | ICD-10-CM | POA: Insufficient documentation

## 2021-02-07 DIAGNOSIS — S0990XA Unspecified injury of head, initial encounter: Secondary | ICD-10-CM | POA: Diagnosis present

## 2021-02-07 DIAGNOSIS — S0993XA Unspecified injury of face, initial encounter: Secondary | ICD-10-CM

## 2021-02-07 DIAGNOSIS — R519 Headache, unspecified: Secondary | ICD-10-CM | POA: Insufficient documentation
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
## 2021-02-07 NOTE — Discharge Instructions (Addendum)
Your imaging was negative for any acute abnormalities.  You may have some very minor concussion symptoms given your headache and feeling a little off.  Take Motrin and Tylenol for your pain, elevate the head of your bed, make sure to follow-up with your dentist about your tooth and return for any new or worsening symptoms.  If you are having mild dizziness you can take over-the-counter meclizine.  Please follow closely with your primary care physician for any new or worsening symptoms.

## 2021-02-07 NOTE — ED Notes (Signed)
Patient transported to CT 
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
## 2021-03-20 NOTE — Progress Notes (Signed)
Office Visit Note  Patient: Whitney Ward             Date of Birth: [birth year]           MRN: [id number]             PCP: Rocky Morel, MD Referring: Huey Romans* Visit Date: 03/25/2021 Occupation: @GUAROCC @  Subjective:  Generalized pain   History of Present Illness: Whitney Ward is a 70 y.o. female with history of fibromyalgia and osteoarthritis.  Patient reports that she has been having more frequent and severe fibromyalgia flares over the past several months.  She describes the flares as a fibromyalgia storm due to the severity of her generalized pain.  She is currently having trapezius muscle tension and tenderness bilaterally.  She is also been having increased right-sided lower back pain.  She has not been having any muscle spasms recently.  She remains on gabapentin 600 mg in the morning and 1200 mg at bedtime.  She has tried taking ibuprofen and acetaminophen combination but notices minimal improvement in her fibromyalgia pain.  She has ongoing pain due to trochanteric bursitis of both hips.  She has pain in both hands but denies any joint swelling at this time.She plans to retire in 2 weeks which she feels will help with her stress level and will allow her to be more active in the mornings. She takes trazodone 150 mg at bedtime for insomnia but continues to have difficulty falling asleep as well as staying asleep due to the severity of pain.     Activities of Daily Living:  Patient reports morning stiffness for several hours.   Patient Reports nocturnal pain.  Difficulty dressing/grooming: Denies Difficulty climbing stairs: Reports Difficulty getting out of chair: Reports Difficulty using hands for taps, buttons, cutlery, and/or writing: Reports  Review of Systems  Constitutional:  Positive for fatigue.  HENT:  Negative for mouth sores, mouth dryness and nose dryness.   Eyes:  Negative for pain, itching and dryness.  Respiratory:   Negative for shortness of breath and difficulty breathing.   Cardiovascular:  Negative for chest pain and palpitations.  Gastrointestinal:  Negative for blood in stool, constipation and diarrhea.  Endocrine: Negative for increased urination.  Genitourinary:  Negative for difficulty urinating.  Musculoskeletal:  Positive for joint pain, joint pain, myalgias, morning stiffness, muscle tenderness and myalgias. Negative for joint swelling.  Skin:  Negative for color change, rash and redness.  Allergic/Immunologic: Negative for susceptible to infections.  Neurological:  Positive for tremors and weakness. Negative for dizziness, numbness, headaches and memory loss.  Hematological:  Negative for bruising/bleeding tendency.  Psychiatric/Behavioral:  Negative for confusion.    PMFS History:  Patient Active Problem List   Diagnosis Date Noted   Fibromyalgia 10/09/2016   Primary insomnia 10/09/2016   Other fatigue 10/09/2016   Tendinopathy of right shoulder 10/09/2016   Trochanteric bursitis of right hip 10/09/2016   Primary osteoarthritis of both hands 10/09/2016   Chronic kidney disease 10/09/2016   Essential hypertension 10/09/2016   Gastroesophageal reflux disease 10/09/2016   Restless leg syndrome 10/09/2016   Malignant neoplasm of female breast ([place]) 10/09/2016   History of breast cancer 10/09/2016   Genital herpes simplex 10/09/2016    Past Medical History:  Diagnosis Date   Arthritis    Breast cancer ([place])    Breast cancer ([place])    Broken toe    Fibromyalgia    Hypertension     History reviewed. No pertinent family  history. Past Surgical History:  Procedure Laterality Date   ABDOMINAL HYSTERECTOMY     BREAST SURGERY     CESAREAN SECTION     ingrown toenail  03/21/2021   SMALL INTESTINE SURGERY     TONSILLECTOMY     Social History   Social History Narrative   Not on file   Immunization History  Administered Date(s) Administered   Moderna SARS-COV2 Booster  Vaccination 05/14/2020   Moderna Sars-Covid-2 Vaccination 07/07/2019, 08/04/2019   PFIZER(Purple Top)SARS-COV-2 Vaccination 03/20/2021     Objective: Vital Signs: BP (!) 173/101 (BP Location: Right Arm, Patient Position: Sitting, Cuff Size: Normal)   Pulse 76   Ht 6' (1.829 m)   Wt 231 lb 12.8 oz (105.1 kg)   BMI 31.44 kg/m    Physical Exam Vitals and nursing note reviewed.  Constitutional:      Appearance: She is well-developed.  HENT:     Head: Normocephalic and atraumatic.  Eyes:     Conjunctiva/sclera: Conjunctivae normal.  Pulmonary:     Effort: Pulmonary effort is normal.  Abdominal:     Palpations: Abdomen is soft.  Musculoskeletal:     Cervical back: Normal range of motion.  Skin:    General: Skin is warm and dry.     Capillary Refill: Capillary refill takes less than 2 seconds.  Neurological:     Mental Status: She is alert and oriented to person, place, and time.  Psychiatric:        Behavior: Behavior normal.     Musculoskeletal Exam: Generalized hyperalgesia and positive tender points on examination.  C-spine has good range of motion with some discomfort with lateral rotation.  Trapezius muscle tension and tenderness bilaterally.  Shoulder joints have good range of motion with some discomfort bilaterally.  Elbow joints, wrist joints, MCPs, PIPs, DIPs have good range of motion with no synovitis.  Complete fist formation bilaterally.  PIP and DIP thickening consistent with osteoarthritis of both hands.  Hip joints have good range of motion with no groin pain.  Tenderness over bilateral trochanteric bursa.  Knee joints have good range of motion with no warmth or effusion.  Ankle joints have good range of motion with no tenderness or joint swelling.  CDAI Exam: CDAI Score: -- Patient Global: --; Provider Global: -- Swollen: --; Tender: -- Joint Exam 03/25/2021   No joint exam has been documented for this visit   There is currently no information documented on the  homunculus. Go to the Rheumatology activity and complete the homunculus joint exam.  Investigation: No additional findings.  Imaging: XR Lumbar Spine 2-3 Views  Result Date: 03/25/2021 S shaped scoliosis of the lumbar spine was noted.  Multilevel spondylosis with the significant narrowing of T12-L1, L1-L2, L4-L5 was noted.  Anterior osteophytes were noted.  Facet joint arthropathy with foraminal narrowing was noted. Impression: These findings are consistent with scoliosis, multilevel spondylosis and facet joint arthropathy.  XR Pelvis 1-2 Views  Result Date: 03/25/2021 No SI joint sclerosis or narrowing was noted.  Mild hip joint narrowing was noted. Impression: Unremarkable x-ray of the SI joints.  Mild osteoarthritic changes were noted in the hip joints.   Recent Labs: Lab Results  Component Value Date   WBC 4.8 09/01/2018   HGB 12.5 09/01/2018   PLT 258 09/01/2018   NA 143 09/01/2018   K 3.7 09/01/2018   CL 105 09/01/2018   CO2 30 09/01/2018   GLUCOSE 95 09/01/2018   BUN 13 09/01/2018   CREATININE 1.01 (H)  09/01/2018   BILITOT 0.6 09/01/2018   ALKPHOS 99 10/15/2016   AST 21 09/01/2018   ALT 16 09/01/2018   PROT 7.1 09/01/2018   ALBUMIN 4.4 10/15/2016   CALCIUM 9.5 09/01/2018   GFRAA 66 09/01/2018    Speciality Comments: No specialty comments available.  Procedures:  No procedures performed Allergies: Codeine, Penicillins, Sulfa antibiotics, Tramadol, Aromasin [exemestane], Atorvastatin, Celexa [citalopram hydrobromide], Citalopram, and Tamoxifen   Assessment / Plan:     Visit Diagnoses: Fibromyalgia - She has generalized hyperalgesia and positive tender points on examination today.  She has been experiencing more frequent and more severe fibromyalgia flares on a weekly basis.  She is currently experiencing generalized myalgias and muscle tenderness. She has trapezius muscle tenderness and tenderness over bilateral trochanteric bursa.  She has been experiencing increased  nocturnal pain which has been worsening her insomnia.  She has also had increased fatigue secondary to insomnia.  She is planning to retire in 2 weeks, which will hopefully improve her stress level and allow her to increase her exercise regimen.  Discussed the importance of regular exercise and good sleep hygiene.  A referral to water therapy will be placed today.  She declined a referral to pain management at this time.  She plans on continuing to take gabapentin as prescribed.  She will follow up in 6 months.   Plan: Ambulatory referral to Physical Therapy  Other fatigue: She continues to experience chronic fatigue secondary to insomnia.  Discussed the importance of regular exercise and good sleep hygiene.  She will be retiring in 2 weeks at which time she plans on trying to increase her exercise regimen.    Primary insomnia - She takes Klonopin 1 mg 1 tablet by mouth at bedtime and trazodone as needed for insomnia.  Discussed the importance of good sleep hygiene.  Tendinopathy of right shoulder: She has good range of motion of the right shoulder joint with some discomfort on examination today.  Trochanteric bursitis of both hips -She continues to experience ongoing discomfort in both hips due to trochanter bursitis bilaterally.  She has good range of motion of both hip joints with no groin pain at this time.  We discussed the importance of performing stretching exercises on a daily basis.  She was given a handout of these exercises to perform.  Referral to physical therapy was placed today.  Plan: Ambulatory referral to Physical Therapy  Primary osteoarthritis of both hands: She has PIP and DIP thickening consistent with osteoarthritis of both hands.  She has been experiencing some pain in both hands but has no inflammation on examination today.  She was able to make a complete fist bilaterally.  Discussed the importance of joint protection and muscle strengthening.  Elevated CK - CK was 299 on  09/05/2019.  Myositis panel was negative in the past as well as aldolase on 09/03/2018.  She is not exhibiting any muscle weakness at this time.  Chronic right-sided low back pain without sciatica -She presents today with some increased right-sided lower back pain.  She did not have any injury prior to the onset of symptoms.  She has not been experiencing any muscle spasms or muscle weakness.  She has no symptoms of radiculopathy.  No midline spinal tenderness was noted.  Some tenderness over the right SI joint and paraspinal muscles in the lumbar region noted.  X-rays of the lumbar spine and pelvis were updated today and were reviewed with the patient today in the office.  Scoliosis and multilevel  spondylosis noted. Different treatment options were offered.  Referral to physical therapy specifically for aquatic therapy was placed today as requested by the patient.  We also discussed referral to pain management but she declined at this time.  She was given 2 salonpas patch samples to apply as needed for pain relief.  She was advised to notify us if her discomfort persists or worsens. plan: XR Pelvis 1-2 Views, XR Lumbar Spine 2-3 Views, Ambulatory referral to Physical Therapy  Other medical conditions are listed as follows:   Occasional tremors  Balance problems  History of gastroesophageal reflux (GERD)  History of hypertension  Restless leg syndrome  History of breast cancer    Orders: Orders Placed This Encounter  Procedures   XR Pelvis 1-2 Views   XR Lumbar Spine 2-3 Views   Ambulatory referral to Physical Therapy    No orders of the defined types were placed in this encounter.   Follow-Up Instructions: Return in about 6 months (around 09/22/2021) for Fibromyalgia, Osteoarthritis.   Ofilia Neas, PA-C  Note - This record has been created using Dragon software.  Chart creation errors have been sought, but may not always  have been located. Such creation errors do not reflect on   the standard of medical care.

## 2021-03-21 HISTORY — PX: OTHER SURGICAL HISTORY: SHX169

## 2021-03-25 ENCOUNTER — Ambulatory Visit: Payer: Self-pay

## 2021-03-25 ENCOUNTER — Ambulatory Visit: Payer: Medicare Other | Admitting: Physician Assistant

## 2021-03-25 ENCOUNTER — Encounter: Payer: Self-pay | Admitting: Physician Assistant

## 2021-03-25 ENCOUNTER — Other Ambulatory Visit: Payer: Self-pay

## 2021-03-25 VITALS — BP 173/101 | HR 76 | Ht 72.0 in | Wt 231.8 lb

## 2021-03-25 DIAGNOSIS — Z8679 Personal history of other diseases of the circulatory system: Secondary | ICD-10-CM

## 2021-03-25 DIAGNOSIS — M797 Fibromyalgia: Secondary | ICD-10-CM

## 2021-03-25 DIAGNOSIS — Z853 Personal history of malignant neoplasm of breast: Secondary | ICD-10-CM

## 2021-03-25 DIAGNOSIS — M7062 Trochanteric bursitis, left hip: Secondary | ICD-10-CM

## 2021-03-25 DIAGNOSIS — F5101 Primary insomnia: Secondary | ICD-10-CM | POA: Diagnosis not present

## 2021-03-25 DIAGNOSIS — G2581 Restless legs syndrome: Secondary | ICD-10-CM

## 2021-03-25 DIAGNOSIS — M545 Low back pain, unspecified: Secondary | ICD-10-CM | POA: Diagnosis not present

## 2021-03-25 DIAGNOSIS — M19042 Primary osteoarthritis, left hand: Secondary | ICD-10-CM

## 2021-03-25 DIAGNOSIS — Z8719 Personal history of other diseases of the digestive system: Secondary | ICD-10-CM

## 2021-03-25 DIAGNOSIS — R748 Abnormal levels of other serum enzymes: Secondary | ICD-10-CM

## 2021-03-25 DIAGNOSIS — M19041 Primary osteoarthritis, right hand: Secondary | ICD-10-CM

## 2021-03-25 DIAGNOSIS — M67911 Unspecified disorder of synovium and tendon, right shoulder: Secondary | ICD-10-CM | POA: Diagnosis not present

## 2021-03-25 DIAGNOSIS — R5383 Other fatigue: Secondary | ICD-10-CM | POA: Diagnosis not present

## 2021-03-25 DIAGNOSIS — R2689 Other abnormalities of gait and mobility: Secondary | ICD-10-CM

## 2021-03-25 DIAGNOSIS — G8929 Other chronic pain: Secondary | ICD-10-CM

## 2021-03-25 DIAGNOSIS — M7061 Trochanteric bursitis, right hip: Secondary | ICD-10-CM

## 2021-03-25 DIAGNOSIS — R251 Tremor, unspecified: Secondary | ICD-10-CM

## 2021-03-25 NOTE — Patient Instructions (Signed)
Myofascial Pain Syndrome and Fibromyalgia Myofascial pain syndrome and fibromyalgia are both pain disorders. This pain may be felt mainly in your muscles. Myofascial pain syndrome: Always has tender points in the muscle that will cause pain when pressed (trigger points). The pain may come and go. Usually affects your neck, upper back, and shoulder areas. The pain often radiates into your arms and hands. Fibromyalgia: Has muscle pains and tenderness that come and go. Is often associated with fatigue and sleep problems. Has trigger points. Tends to be long-lasting (chronic), but is not life-threatening. Fibromyalgia and myofascial pain syndrome are not the same. However, they often occur together. If you have both conditions, each can make the other worse. Both are common and can cause enough pain and fatigue to make day-to-day activities difficult. Both can be hard to diagnose because their symptoms are common in many other conditions. What are the causes? The exact causes of these conditions are not known. What increases the risk? You are more likely to develop this condition if: You have a family history of the condition. You have certain triggers, such as: Spine disorders. An injury (trauma) or other physical stressors. Being under a lot of stress. Medical conditions such as osteoarthritis, rheumatoid arthritis, or lupus. What are the signs or symptoms? Fibromyalgia The main symptom of fibromyalgia is widespread pain and tenderness in your muscles. Pain is sometimes described as stabbing, shooting, or burning. You may also have: Tingling or numbness. Sleep problems and fatigue. Problems with attention and concentration (fibro fog). Other symptoms may include:  Bowel and bladder problems. Headaches. Visual problems. Problems with odors and noises. Depression or mood changes. Painful menstrual periods (dysmenorrhea). Dry skin or eyes. These symptoms can vary over time. Myofascial  pain syndrome Symptoms of myofascial pain syndrome include: Tight, ropy bands of muscle. Uncomfortable sensations in muscle areas. These may include aching, cramping, burning, numbness, tingling, and weakness. Difficulty moving certain parts of the body freely (poor range of motion). How is this diagnosed? This condition may be diagnosed by your symptoms and medical history. You will also have a physical exam. In general: Fibromyalgia is diagnosed if you have pain, fatigue, and other symptoms for more than 3 months, and symptoms cannot be explained by another condition. Myofascial pain syndrome is diagnosed if you have trigger points in your muscles, and those trigger points are tender and cause pain elsewhere in your body (referred pain). How is this treated? Treatment for these conditions depends on the type that you have. For fibromyalgia: Pain medicines, such as NSAIDs. Medicines for treating depression. Medicines for treating seizures. Medicines that relax the muscles. For myofascial pain: Pain medicines, such as NSAIDs. Cooling and stretching of muscles. Trigger point injections. Sound wave (ultrasound) treatments to stimulate muscles. Treating these conditions often requires a team of health care providers. These may include: Your primary care provider. Physical therapist. Complementary health care providers, such as massage therapists or acupuncturists. Psychiatrist for cognitive behavioral therapy. Follow these instructions at home: Medicines Take over-the-counter and prescription medicines only as told by your health care provider. Do not drive or use heavy machinery while taking prescription pain medicine. If you are taking prescription pain medicine, take actions to prevent or treat constipation. Your health care provider may recommend that you: Drink enough fluid to keep your urine pale yellow. Eat foods that are high in fiber, such as fresh fruits and vegetables, whole  grains, and beans. Limit foods that are high in fat and processed sugars, such as fried   or sweet foods. Take an over-the-counter or prescription medicine for constipation. Lifestyle  Exercise as directed by your health care provider or physical therapist. Practice relaxation techniques to control your stress. You may want to try: Biofeedback. Visual imagery. Hypnosis. Muscle relaxation. Yoga. Meditation. Maintain a healthy lifestyle. This includes eating a healthy diet and getting enough sleep. Do not use any products that contain nicotine or tobacco, such as cigarettes and e-cigarettes. If you need help quitting, ask your health care provider. General instructions Talk to your health care provider about complementary treatments, such as acupuncture or massage. Consider joining a support group with others who are diagnosed with this condition. Do not do activities that stress or strain your muscles. This includes repetitive motions and heavy lifting. Keep all follow-up visits as told by your health care provider. This is important. Where to find more information National Fibromyalgia Association: www.fmaware.[place]: www.arthritis.org American Chronic Pain Association: www.theacpa.org Contact a health care provider if: You have new symptoms. Your symptoms get worse or your pain is severe. You have side effects from your medicines. You have trouble sleeping. Your condition is causing depression or anxiety. Summary Myofascial pain syndrome and fibromyalgia are pain disorders. Myofascial pain syndrome has tender points in the muscle that will cause pain when pressed (trigger points). Fibromyalgia also has muscle pains and tenderness that come and go, but this condition is often associated with fatigue and sleep disturbances. Fibromyalgia and myofascial pain syndrome are not the same but often occur together, causing pain and fatigue that make day-to-day activities  difficult. Treatment for fibromyalgia includes taking medicines to relax the muscles and medicines for pain, depression, or seizures. Treatment for myofascial pain syndrome includes taking medicines for pain, cooling and stretching of muscles, and injecting medicines into trigger points. Follow your health care provider's instructions for taking medicines and maintaining a healthy lifestyle. This information is not intended to replace advice given to you by your health care provider. Make sure you discuss any questions you have with your health care provider. Document Revised: 10/08/2018 Document Reviewed: 07/01/2017 Elsevier Patient Education  2022 [place]. Hip Bursitis Rehab Ask your health care provider which exercises are safe for you. Do exercises exactly as told by your health care provider and adjust them as directed. It is normal to feel mild stretching, pulling, tightness, or discomfort as you do these exercises. Stop right away if you feel sudden pain or your pain gets worse. Do not begin these exercises until told by your health care provider. Stretching exercise This exercise warms up your muscles and joints and improves the movement and flexibility of your hip. This exercise also helps to relieve pain and stiffness. Iliotibial band stretch An iliotibial band is a strong band of muscle tissue that runs from the outer side of your hip to the outer side of your thigh and knee. Lie on your side with your left / right leg in the top position. Bend your left / right knee and grab your ankle. Stretch out your bottom arm to help you balance. Slowly bring your knee back so your thigh is behind your body. Slowly lower your knee toward the floor until you feel a gentle stretch on the outside of your left / right thigh. If you do not feel a stretch and your knee will not fall farther, place the heel of your other foot on top of your knee and pull your knee down toward the floor with your  foot. Hold this position for  __________ seconds. Slowly return to the starting position. Repeat __________ times. Complete this exercise __________ times a day. Strengthening exercises These exercises build strength and endurance in your hip and pelvis. Endurance is the ability to use your muscles for a long time, even after they get tired. Bridge This exercise strengthens the muscles that move your thigh backward (hip extensors). Lie on your back on a firm surface with your knees bent and your feet flat on the floor. Tighten your buttocks muscles and lift your buttocks off the floor until your trunk is level with your thighs. Do not arch your back. You should feel the muscles working in your buttocks and the back of your thighs. If you do not feel these muscles, slide your feet 1-2 inches (2.5-5 cm) farther away from your buttocks. If this exercise is too easy, try doing it with your arms crossed over your chest. Hold this position for __________ seconds. Slowly lower your hips to the starting position. Let your muscles relax completely after each repetition. Repeat __________ times. Complete this exercise __________ times a day. Squats This exercise strengthens the muscles in front of your thigh and knee (quadriceps). Stand in front of a table, with your feet and knees pointing straight ahead. You may rest your hands on the table for balance but not for support. Slowly bend your knees and lower your hips like you are going to sit in a chair. Keep your weight over your heels, not over your toes. Keep your lower legs upright so they are parallel with the table legs. Do not let your hips go lower than your knees. Do not bend lower than told by your health care provider. If your hip pain increases, do not bend as low. Hold the squat position for __________ seconds. Slowly push with your legs to return to standing. Do not use your hands to pull yourself to standing. Repeat __________ times.  Complete this exercise __________ times a day. Hip hike Stand sideways on a bottom step. Stand on your left / right leg with your other foot unsupported next to the step. You can hold on to the railing or wall for balance if needed. Keep your knees straight and your torso square. Then lift your left / right hip up toward the ceiling. Hold this position for __________ seconds. Slowly let your left / right hip lower toward the floor, past the starting position. Your foot should get closer to the floor. Do not lean or bend your knees. Repeat __________ times. Complete this exercise __________ times a day. Single leg stand Without shoes, stand near a railing or in a doorway. You may hold on to the railing or door frame as needed for balance. Squeeze your left / right buttock muscles, then lift up your other foot. Do not let your left / right hip push out to the side. It is helpful to stand in front of a mirror for this exercise so you can watch your hip. Hold this position for __________ seconds. Repeat __________ times. Complete this exercise __________ times a day. This information is not intended to replace advice given to you by your health care provider. Make sure you discuss any questions you have with your health care provider. Document Revised: 10/11/2018 Document Reviewed: 10/11/2018 Elsevier Patient Education  [place].

## 2021-04-08 ENCOUNTER — Ambulatory Visit (HOSPITAL_BASED_OUTPATIENT_CLINIC_OR_DEPARTMENT_OTHER): Payer: Self-pay | Admitting: Physical Therapy

## 2021-04-18 ENCOUNTER — Ambulatory Visit (HOSPITAL_BASED_OUTPATIENT_CLINIC_OR_DEPARTMENT_OTHER): Payer: Medicare Other | Attending: Physician Assistant | Admitting: Physical Therapy

## 2021-04-18 ENCOUNTER — Other Ambulatory Visit: Payer: Self-pay

## 2021-04-18 DIAGNOSIS — M25561 Pain in right knee: Secondary | ICD-10-CM | POA: Insufficient documentation

## 2021-04-18 DIAGNOSIS — G8929 Other chronic pain: Secondary | ICD-10-CM | POA: Insufficient documentation

## 2021-04-18 DIAGNOSIS — R2689 Other abnormalities of gait and mobility: Secondary | ICD-10-CM | POA: Insufficient documentation

## 2021-04-18 DIAGNOSIS — M25562 Pain in left knee: Secondary | ICD-10-CM | POA: Diagnosis not present

## 2021-04-18 DIAGNOSIS — M545 Low back pain, unspecified: Secondary | ICD-10-CM | POA: Diagnosis present

## 2021-04-18 NOTE — Therapy (Signed)
OUTPATIENT PHYSICAL THERAPY LOWER EXTREMITY EVALUATION   Patient Name: Whitney Ward MRN: [id number] DOB:January 19, 1951, 70 y.o., female Today's Date: 04/18/2021    Past Medical History:  Diagnosis Date   Arthritis    Breast cancer ([place])    Breast cancer ([place])    Broken toe    Fibromyalgia    Hypertension    Past Surgical History:  Procedure Laterality Date   ABDOMINAL HYSTERECTOMY     BREAST SURGERY     CESAREAN SECTION     ingrown toenail  03/21/2021   SMALL INTESTINE SURGERY     TONSILLECTOMY     Patient Active Problem List   Diagnosis Date Noted   Fibromyalgia 10/09/2016   Primary insomnia 10/09/2016   Other fatigue 10/09/2016   Tendinopathy of right shoulder 10/09/2016   Trochanteric bursitis of right hip 10/09/2016   Primary osteoarthritis of both hands 10/09/2016   Chronic kidney disease 10/09/2016   Essential hypertension 10/09/2016   Gastroesophageal reflux disease 10/09/2016   Restless leg syndrome 10/09/2016   Malignant neoplasm of female breast ([place]) 10/09/2016   History of breast cancer 10/09/2016   Genital herpes simplex 10/09/2016    PCP: Rocky Morel, MD  REFERRING PROVIDER: Ofilia Neas, PA-C  REFERRING DIAG:  M79.7 (ICD-10-CM) - Fibromyalgia  M70.61,M70.62 (ICD-10-CM) - Trochanteric bursitis of both hips  M54.50,G89.29 (ICD-10-CM) - Chronic right-sided low back pain without sciatica    THERAPY DIAG:  Low back pain  Bilateral hip pain   ONSET DATE: 6-7 years prior  SUBJECTIVE:   SUBJECTIVE STATEMENT: Patient feels like she has pain in her hips and into her thighs. She has had the pain for about 6-7 years. She feels like the pain has gotten worse. She is now having increased pain walking and going up and down steps.   PERTINENT HISTORY: OA: Hips, Lower back, and shoulders; Breast Cancer in 2012 ; Broken toe last year and ankle;    PAIN:  Are you having pain? No VAS scale: 3/10 can reach an 8/10 with standing   Pain location: Low Back  Pain orientation: Left and right  PAIN TYPE: aching and burning Pain description: constant  Aggravating factors: sitting for too long or standing for too long  Relieving factors: patient can use heat   Are you having pain? Yes VAS scale: 3/10 Pain location: bilateral hips  Pain orientation: Right and Left posterior hips  PAIN TYPE: aching, burning  Pain description: constant  Aggravating factors: Standing and walking  Relieving factors: heat and advil   How long can you sit: depends on position or surface; has to shift How long can you stand for: 30 min   PRECAUTIONS: None  WEIGHT BEARING RESTRICTIONS No  FALLS:  Has patient fallen in last 6 months? No, Number of falls:   LIVING ENVIRONMENT: Lives with: lives with their family Lives in: House/apartment Stairs: Yes; Internal: 18 steps;  Has following equipment at home: None  OCCUPATION: Retired  Merchandiser, retail   hobbies: WALKING   PLOF: Independent  PATIENT GOALS    To develop a program to return to exercising     OBJECTIVE:   DIAGNOSTIC FINDINGS:  Lumbar spine 9/22  shaped scoliosis of the lumbar spine was noted.  Multilevel spondylosis  with the significant narrowing of T12-L1, L1-L2, L4-L5 was noted.    Anterior osteophytes were noted.  Facet joint arthropathy with foraminal  narrowing was noted.   Bilateral hips: No hip joint narrowing or SI joint changes were noted.  No  chondrocalcinosis was noted.   Right knee:  Mild medial compartment narrowing was noted.  Moderate patellofemoral  narrowing was noted.  No chondrocalcinosis was noted.   Left knee x-ray: No hip joint narrowing or SI joint changes were noted.  No  chondrocalcinosis was noted.   Impression: Unremarkable x-ray of the hip joints.  Impression: These findings are consistent with mild osteoarthritis and  moderate chondromalacia patella. Impression: Unremarkable x-ray of the hip joints.  Impression: These  findings are consistent with scoliosis, multilevel  spondylosis and facet joint arthropathy.  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  POSTURE:  Stands in a flexed position; forward head;  LE AROM/PROM:  PROM Right 04/18/2021 Left 04/18/2021  Hip flexion To 90 degrees but painful 60 before she starts to have pain but can get to 90 degrees   Hip extension    Hip abduction    Hip adduction    Hip internal rotation Pain  Pain   Hip external rotation    Knee flexion    Knee extension    Ankle dorsiflexion    Ankle plantarflexion    Ankle inversion    Ankle eversion     (Blank rows = not tested)  LE MMT:  MMT Right 04/18/2021 Left 04/18/2021  Hip flexion 14.9 14.5  Hip extension    Hip abduction 19.8 23.8  Hip adduction    Hip internal rotation    Hip external rotation    Knee flexion    Knee extension 19.6 18.2  Ankle dorsiflexion    Ankle plantarflexion    Ankle inversion    Ankle eversion      Lumbar Motion  Flexion 10-50 with pain Extension can come to neutral but it is painful  Left rotation limited 50% with pain  Right rotation limited 2% with pain    Sit to stand: slow transfer after sitting for some time    GAIT: Comments: flexed posture with gait. Decreased hip and shoulder rotation bilateral; decreased hip flexion     TODAY'S TREATMENT: Reviewed chronic pain concepts of desensitization of the CNS and hurt vs harm  Program Notes Liba back massager  Also reviewed use of tennis ball for self trigger point release and de-sensitization   Exercises Theracane Over Shoulder - 1 x daily - 7 x weekly - 3 sets - 10 reps Supine Lower Trunk Rotation - 1 x daily - 7 x weekly - 3 sets - 10 reps Seated Hamstring  Stretch - 1 x daily - 7 x weekly - 3 sets - 10 reps     PATIENT EDUCATION:  Education details: reviewed PNE concepts; symptom management, HEP Person educated: Patient Education method: Explanation, Demonstration, Tactile cues, Verbal cues, and Handouts Education comprehension: verbalized understanding, returned demonstration, verbal cues required, tactile cues required, and needs further education   HEP:  access Code: [email or web address] URL: [email or web address] Date: 04/19/2021 Prepared by: Carolyne Littles  Program Notes Liba    Exercises Theracane Over Shoulder - 1 x daily - 7 x weekly - 3 sets - 10 reps Supine Lower Trunk Rotation - 1 x daily - 7 x weekly - 3 sets - 10 reps Seated Hamstring Stretch - 1 x daily - 7 x weekly - 3 sets - 10 reps  ASSESSMENT:  CLINICAL IMPRESSION: Patient is a 70 year old female with a 6-7 year history of low back pain and bilateral knee pain. She has increased pain when she sits for too long and when she stands for too long. Her pain is effecting her ability to perform ADL's and walk for exercises. She has significant limitations in her hip motion and limitations in her hip strength. She has limited lumbar flexion and pain with rotation. She has decreased upper and lower body mobility with ambualtions. She would benefit from skilled therapy to improve overall motion, strength, and ability to perfrom daily activity with her hips and knees.   Abnormal gait, decreased activity tolerance, decreased endurance, difficulty walking, decreased ROM, decreased strength, increased fascial restrictions, increased muscle spasms, improper body mechanics, and pain  Time since onset of injury/illness/exacerbation and 3+ comorbidities: Fibromyalgia, multi joint OA; broken ankle and toe 2021   cleaning, community activity, meal prep, laundry, and yard work  [employer] POTENTIAL: Good  CLINICAL DECISION MAKING: Evolving/moderate complexity increasing difficulty  standing walking and sitting   EVALUATION COMPLEXITY: Moderate   GOALS: Goals reviewed with patient? Yes  SHORT TERM GOALS:  STG Name Target Date Goal status  1 Patient will increase pain free hip motion to 90 degrees  Baseline:  05/10/2021 INITIAL  2 Patient will increase bilateral hip flexion and abduction strength by 5 lbs peak torque  Baseline:  05/10/2021 INITIAL  3 Patient will increase lumbar flexion by 10 degrees  Baseline: 05/10/2021 INITIAL    LONG TERM GOALS:   LTG Name Target Date Goal status  1 Patient will be independent with complete HEP for water and the gym  Baseline: 05/31/2021 INITIAL  2 Patient will sit for 30 min without self reported pain  Baseline: 05/31/2021 INITIAL  3 Patient will stand for 30 min without self reported pain in order to perform ADL;s  Baseline: 05/31/2021 INITIAL  PLAN: PT FREQUENCY: 2x/week  PT DURATION: 6 weeks  PLANNED INTERVENTIONS: Therapeutic exercises, Therapeutic activity, Neuro Muscular re-education, Balance training, Gait training, Patient/Family education, Joint mobilization, Stair training, Aquatic Therapy, Dry Needling, Electrical stimulation, Cryotherapy, Moist heat, Taping, Traction, Ultrasound, and Manual therapy  PLAN FOR NEXT SESSION: begin aquatic rehab in the pool. Review static and dynamic stretching; review hip exercises that she can progress to land   Carney Living PT DPT  04/18/2021, 11:46 AM

## 2021-04-23 ENCOUNTER — Encounter (HOSPITAL_BASED_OUTPATIENT_CLINIC_OR_DEPARTMENT_OTHER): Payer: Medicare Other | Attending: Internal Medicine | Admitting: Physical Therapy

## 2021-04-23 ENCOUNTER — Encounter (HOSPITAL_BASED_OUTPATIENT_CLINIC_OR_DEPARTMENT_OTHER): Payer: Self-pay | Admitting: Physical Therapy

## 2021-04-23 ENCOUNTER — Other Ambulatory Visit: Payer: Self-pay

## 2021-04-23 DIAGNOSIS — R2689 Other abnormalities of gait and mobility: Secondary | ICD-10-CM | POA: Diagnosis present

## 2021-04-23 DIAGNOSIS — M25561 Pain in right knee: Secondary | ICD-10-CM | POA: Insufficient documentation

## 2021-04-23 DIAGNOSIS — M545 Low back pain, unspecified: Secondary | ICD-10-CM | POA: Diagnosis present

## 2021-04-23 DIAGNOSIS — G8929 Other chronic pain: Secondary | ICD-10-CM | POA: Insufficient documentation

## 2021-04-23 DIAGNOSIS — M25562 Pain in left knee: Secondary | ICD-10-CM | POA: Diagnosis not present

## 2021-04-23 NOTE — Therapy (Signed)
OUTPATIENT PHYSICAL THERAPY TREATMENT NOTE   Patient Name: Whitney Ward MRN: [id number] DOB:11/12/1950, 70 y.o., female Today's Date: 04/23/2021  PCP: Rocky Morel, MD REFERRING PROVIDER: Huey Romans*    Past Medical History:  Diagnosis Date   Arthritis    Breast cancer ([place])    Breast cancer ([place])    Broken toe    Fibromyalgia    Hypertension    Past Surgical History:  Procedure Laterality Date   ABDOMINAL HYSTERECTOMY     BREAST SURGERY     CESAREAN SECTION     ingrown toenail  03/21/2021   SMALL INTESTINE SURGERY     TONSILLECTOMY     Patient Active Problem List   Diagnosis Date Noted   Fibromyalgia 10/09/2016   Primary insomnia 10/09/2016   Other fatigue 10/09/2016   Tendinopathy of right shoulder 10/09/2016   Trochanteric bursitis of right hip 10/09/2016   Primary osteoarthritis of both hands 10/09/2016   Chronic kidney disease 10/09/2016   Essential hypertension 10/09/2016   Gastroesophageal reflux disease 10/09/2016   Restless leg syndrome 10/09/2016   Malignant neoplasm of female breast ([place]) 10/09/2016   History of breast cancer 10/09/2016   Genital herpes simplex 10/09/2016   PCP: Rocky Morel, MD   REFERRING PROVIDER: Ofilia Neas, PA-C   REFERRING DIAG:  M79.7 (ICD-10-CM) - Fibromyalgia  M70.61,M70.62 (ICD-10-CM) - Trochanteric bursitis of both hips  M54.50,G89.29 (ICD-10-CM) - Chronic right-sided low back pain without sciatica      THERAPY DIAG:  Low back pain  Bilateral hip pain    ONSET DATE: 6-7 years prior   SUBJECTIVE:    SUBJECTIVE STATEMENT: Patient reports she has been doing her stretching exercises at home. She is ready to start working on pool exercises.    PERTINENT HISTORY: OA: Hips, Lower back, and shoulders; Breast Cancer in 2012 ; Broken toe last year and ankle;      PAIN:  Are you having pain? yes no significant change in pain from the last visit. VAS not formally  assesed   VAS scale: 3/10 can reach an 8/10 with standing  Pain location: Low Back  Pain orientation: Left and right  PAIN TYPE: aching and burning Pain description: constant  Aggravating factors: sitting for too long or standing for too long  Relieving factors: patient can use heat    Are you having pain? Yes VAS scale: 3/10 Pain location: bilateral hips  Pain orientation: Right and Left posterior hips  PAIN TYPE: aching, burning  Pain description: constant  Aggravating factors: Standing and walking  Relieving factors: heat and advil    How long can you sit: depends on position or surface; has to shift How long can you stand for: 30 min   TODAY'S TREATMENT:  Pt seen for aquatic therapy today.  Treatment took place in water 3.25-4 ft in depth at the [facility] pool. Temp of water was 91.  Pt entered/exited the pool via stairs (step through pattern) independently with bilat rail.  Introduction to water. Had patient stand at different levels so she could feel the buoyancy   Warm up: heel/toe walking x4 laps across pool chest deep side stepping x4 laps from shallow to deep;  Ambulation with noodle support: long strides, march   Exercises; Slow march x20; squats x20; hip extension x20; hip abduction x20; reviewed how to do in low range   board trunk flexion x10 lateral board rotation x10 each way seated   With neck noddle and foot float; push  pull x20; side to side perturbations from the feet x20; press and push x20   Seated LAQ and March x20    Pt requires buoyancy for support and to offload joints with strengthening exercises. Viscosity of the water is needed for resistance of strengthening; water current perturbations provides challenge to standing balance unsupported, requiring increased core activation     PATIENT EDUCATION:  Education details: reviewed PNE concepts; symptom management, HEP Person educated: Patient Education method: Explanation,  Demonstration, Tactile cues, Verbal cues, and Handouts Education comprehension: verbalized understanding, returned demonstration, verbal cues required, tactile cues required, and needs further education     HEP:   access Code: [email or web address] URL: [email or web address] Date: 04/19/2021 Prepared by: Carolyne Littles   Program Notes Liba      Exercises Theracane Over Shoulder - 1 x daily - 7 x weekly - 3 sets - 10 reps Supine Lower Trunk Rotation - 1 x daily - 7 x weekly - 3 sets - 10 reps Seated Hamstring Stretch - 1 x daily - 7 x weekly - 3 sets - 10 reps     ASSESSMENT:   CLINICAL IMPRESSION: Therapy focused on technique with pool walking. Overall we are trying to decrease her lateral movement with gait. She had no significant pain with treatment. She tolerated 3 way hip activity well. She was advised to let therapy know if her back and kenes flair up later but otherwise we will progress as tolerated.    Abnormal gait, decreased activity tolerance, decreased endurance, difficulty walking, decreased ROM, decreased strength, increased fascial restrictions, increased muscle spasms, improper body mechanics, and pain   Time since onset of injury/illness/exacerbation and 3+ comorbidities: Fibromyalgia, multi joint OA; broken ankle and toe 2021    cleaning, community activity, meal prep, laundry, and yard work   [employer] POTENTIAL: Good   CLINICAL DECISION MAKING: Evolving/moderate complexity increasing difficulty standing walking and sitting    EVALUATION COMPLEXITY: Moderate     GOALS: Goals reviewed with patient? Yes   SHORT TERM GOALS:   STG Name Target Date Goal status  1 Patient will increase pain free hip motion to 90 degrees  Baseline:  05/10/2021 INITIAL  2 Patient will increase bilateral hip flexion and abduction strength by 5 lbs peak torque  Baseline:  05/10/2021 INITIAL  3 Patient will increase lumbar flexion by 10 degrees  Baseline: 05/10/2021 INITIAL       LONG TERM GOALS:    LTG Name Target Date Goal status  1 Patient will be independent with complete HEP for water and the gym  Baseline: 05/31/2021 INITIAL  2 Patient will sit for 30 min without self reported pain  Baseline: 05/31/2021 INITIAL  3 Patient will stand for 30 min without self reported pain in order to perform ADL;s  Baseline: 05/31/2021 INITIAL  PLAN: PT FREQUENCY: 2x/week   PT DURATION: 6 weeks   PLANNED INTERVENTIONS: Therapeutic exercises, Therapeutic activity, Neuro Muscular re-education, Balance training, Gait training, Patient/Family education, Joint mobilization, Stair training, Aquatic Therapy, Dry Needling, Electrical stimulation, Cryotherapy, Moist heat, Taping, Traction, Ultrasound, and Manual therapy   PLAN FOR NEXT SESSION: begin aquatic rehab in the pool. Review static and dynamic stretching; review hip exercises that she can progress to land     Carney Living PT DPT  04/23/2021, 11:50 AM

## 2021-04-24 ENCOUNTER — Encounter (HOSPITAL_BASED_OUTPATIENT_CLINIC_OR_DEPARTMENT_OTHER): Payer: Self-pay | Admitting: Physical Therapy

## 2021-05-01 ENCOUNTER — Other Ambulatory Visit: Payer: Self-pay

## 2021-05-01 ENCOUNTER — Encounter (HOSPITAL_BASED_OUTPATIENT_CLINIC_OR_DEPARTMENT_OTHER): Payer: Self-pay | Admitting: Physical Therapy

## 2021-05-01 ENCOUNTER — Ambulatory Visit (HOSPITAL_BASED_OUTPATIENT_CLINIC_OR_DEPARTMENT_OTHER): Payer: Medicare Other | Attending: Physician Assistant | Admitting: Physical Therapy

## 2021-05-01 DIAGNOSIS — M545 Low back pain, unspecified: Secondary | ICD-10-CM | POA: Insufficient documentation

## 2021-05-01 DIAGNOSIS — R262 Difficulty in walking, not elsewhere classified: Secondary | ICD-10-CM | POA: Insufficient documentation

## 2021-05-01 DIAGNOSIS — M6281 Muscle weakness (generalized): Secondary | ICD-10-CM | POA: Insufficient documentation

## 2021-05-01 DIAGNOSIS — M25561 Pain in right knee: Secondary | ICD-10-CM | POA: Diagnosis present

## 2021-05-01 DIAGNOSIS — R2681 Unsteadiness on feet: Secondary | ICD-10-CM | POA: Insufficient documentation

## 2021-05-01 DIAGNOSIS — R293 Abnormal posture: Secondary | ICD-10-CM | POA: Insufficient documentation

## 2021-05-01 DIAGNOSIS — R2689 Other abnormalities of gait and mobility: Secondary | ICD-10-CM | POA: Diagnosis present

## 2021-05-01 DIAGNOSIS — G8929 Other chronic pain: Secondary | ICD-10-CM | POA: Diagnosis present

## 2021-05-01 DIAGNOSIS — M25562 Pain in left knee: Secondary | ICD-10-CM | POA: Insufficient documentation

## 2021-05-01 NOTE — Therapy (Signed)
OUTPATIENT PHYSICAL THERAPY TREATMENT NOTE   Patient Name: MAKENSIE MULHALL MRN: [id number] DOB:October 20, 1950, 70 y.o., female Today's Date: 05/01/2021  PCP: Rocky Morel, MD REFERRING PROVIDER: Huey Romans*   PT End of Session - 05/01/21 1206     Visit Number 3    Number of Visits 12    Date for PT Re-Evaluation 05/31/21    PT Start Time 0998    PT Stop Time 3382    PT Time Calculation (min) 43 min             Past Medical History:  Diagnosis Date   Arthritis    Breast cancer ([place])    Breast cancer ([place])    Broken toe    Fibromyalgia    Hypertension    Past Surgical History:  Procedure Laterality Date   ABDOMINAL HYSTERECTOMY     BREAST SURGERY     CESAREAN SECTION     ingrown toenail  03/21/2021   SMALL INTESTINE SURGERY     TONSILLECTOMY     Patient Active Problem List   Diagnosis Date Noted   Fibromyalgia 10/09/2016   Primary insomnia 10/09/2016   Other fatigue 10/09/2016   Tendinopathy of right shoulder 10/09/2016   Trochanteric bursitis of right hip 10/09/2016   Primary osteoarthritis of both hands 10/09/2016   Chronic kidney disease 10/09/2016   Essential hypertension 10/09/2016   Gastroesophageal reflux disease 10/09/2016   Restless leg syndrome 10/09/2016   Malignant neoplasm of female breast ([place]) 10/09/2016   History of breast cancer 10/09/2016   Genital herpes simplex 10/09/2016   PCP: Rocky Morel, MD   REFERRING PROVIDER: Ofilia Neas, PA-C   REFERRING DIAG:  M79.7 (ICD-10-CM) - Fibromyalgia  M70.61,M70.62 (ICD-10-CM) - Trochanteric bursitis of both hips  M54.50,G89.29 (ICD-10-CM) - Chronic right-sided low back pain without sciatica      THERAPY DIAG:  Low back pain  Bilateral hip pain    ONSET DATE: 6-7 years prior   SUBJECTIVE:    SUBJECTIVE STATEMENT: My upper thighs are bothering me today    PERTINENT HISTORY: OA: Hips, Lower back, and shoulders; Breast Cancer in 2012 ; Broken  toe last year and ankle;      PAIN:  Are you having pain? yes no significant change in pain from the last visit. VAS not formally assesed   VAS scale: 3/10 can reach an 8/10 with standing  Pain location: Low Back  Pain orientation: Left and right  PAIN TYPE: aching and burning Pain description: constant  Aggravating factors: sitting for too long or standing for too long  Relieving factors: patient can use heat    Are you having pain? Yes VAS scale: 3/10 05/01/21 Pain location: bilateral hips  Pain orientation: Right and Left posterior hips  PAIN TYPE: aching, burning  Pain description: constant  Aggravating factors: Standing and walking  Relieving factors: heat and advil    How long can you sit: depends on position or surface; has to shift How long can you stand for: 30 min   TODAY'S TREATMENT:  05/01/21 Hip extension /flex stretch  Pt seen for aquatic therapy today.  Treatment took place in water 3.25-4 ft in depth at the [facility] pool. Temp of water was 91.  Pt entered/exited the pool via stairs (step through pattern) independently with bilat rail.  Warm up: heel/toe walking x4 laps across pool chest deep side stepping x4 laps from shallow to deep;  No ue support: long strides, march   Standing Hip  flex and quad stretching using noodle 3 x 30 sec hold Exercises: -hip flex pulling noodle x10 R/L  Supine suspension [employer]az, Pt with lumbar belt around hips and nek doodle for neck support.  .  Pt assisted into supine floating position by lying head on shoulder of PT to get into floating position.  PT at torso and assisting with trunk left to right and vice versa to engage trunk muscles. PT then rotated trunk in order to engage abdomnal (internal and external obliques). -manual stretching into left side bending/stretching right. -Posterior chain glut strengthening x7-8 R/L. Verbal and TC for proper execution. Min assist initiating to gain understanding of proper  tech and focus muscle.  Left glut fatigue upon completion. -Posterior chain knee flex x 10 R/L   Pt requires buoyancy for support and to offload joints with strengthening exercises. Viscosity of the water is needed for resistance of strengthening; water current perturbations provides challenge to standing balance unsupported, requiring increased core activation     PATIENT EDUCATION:  Education details: reviewed PNE concepts; symptom management, HEP Person educated: Patient Education method: Explanation, Demonstration, Tactile cues, Verbal cues, and Handouts Education comprehension: verbalized understanding, returned demonstration, verbal cues required, tactile cues required, and needs further education     HEP:   access Code: [email or web address] URL: [email or web address] Date: 04/19/2021 Prepared by: Carolyne Littles   Program Notes Liba      Exercises Theracane Over Shoulder - 1 x daily - 7 x weekly - 3 sets - 10 reps Supine Lower Trunk Rotation - 1 x daily - 7 x weekly - 3 sets - 10 reps Seated Hamstring Stretch - 1 x daily - 7 x weekly - 3 sets - 10 reps     ASSESSMENT:   CLINICAL IMPRESSION: Pt directed through LE and lb stretching prior to engaging in strengthening.  Bad [employer]az tech used along with manual tech to stretch lateral core. Pt has difficulty with right lat side core activation in sup position. Good activation of glutes with post chain exercises resisted by water and ankle foam buoy. Left glut does fatigue before right.     Abnormal gait, decreased activity tolerance, decreased endurance, difficulty walking, decreased ROM, decreased strength, increased fascial restrictions, increased muscle spasms, improper body mechanics, and pain   Time since onset of injury/illness/exacerbation and 3+ comorbidities: Fibromyalgia, multi joint OA; broken ankle and toe 2021    cleaning, community activity, meal prep, laundry, and yard work   [employer] POTENTIAL: Good   CLINICAL  DECISION MAKING: Evolving/moderate complexity increasing difficulty standing walking and sitting    EVALUATION COMPLEXITY: Moderate     GOALS: Goals reviewed with patient? Yes   SHORT TERM GOALS:   STG Name Target Date Goal status  1 Patient will increase pain free hip motion to 90 degrees  Baseline:  05/10/2021 INITIAL  2 Patient will increase bilateral hip flexion and abduction strength by 5 lbs peak torque  Baseline:  05/10/2021 INITIAL  3 Patient will increase lumbar flexion by 10 degrees  Baseline: 05/10/2021 INITIAL      LONG TERM GOALS:    LTG Name Target Date Goal status  1 Patient will be independent with complete HEP for water and the gym  Baseline: 05/31/2021 INITIAL  2 Patient will sit for 30 min without self reported pain  Baseline: 05/31/2021 INITIAL  3 Patient will stand for 30 min without self reported pain in order to perform ADL;s  Baseline: 05/31/2021 INITIAL  PLAN: PT FREQUENCY: 2x/week   PT  DURATION: 6 weeks   PLANNED INTERVENTIONS: Therapeutic exercises, Therapeutic activity, Neuro Muscular re-education, Balance training, Gait training, Patient/Family education, Joint mobilization, Stair training, Aquatic Therapy, Dry Needling, Electrical stimulation, Cryotherapy, Moist heat, Taping, Traction, Ultrasound, and Manual therapy   PLAN FOR NEXT SESSION: Progress gait training and core strnegthening     Vedia Pereyra MPT 05/01/2021, 1:44 PM

## 2021-05-10 ENCOUNTER — Ambulatory Visit (HOSPITAL_BASED_OUTPATIENT_CLINIC_OR_DEPARTMENT_OTHER): Payer: Medicare Other | Admitting: Physical Therapy

## 2021-05-10 ENCOUNTER — Encounter (HOSPITAL_BASED_OUTPATIENT_CLINIC_OR_DEPARTMENT_OTHER): Payer: Self-pay

## 2021-05-20 NOTE — Progress Notes (Signed)
Office Visit Note  Patient: Whitney Ward             Date of Birth: [birth year]           MRN: [id number]             PCP: Rocky Morel, MD Referring: Huey Romans* Visit Date: 05/22/2021 Occupation: @GUAROCC @  Subjective:  Left trochanteric bursitis  History of Present Illness: Whitney Ward is a 70 y.o. female with history of fibromyalgia and osteoarthritis.  Patient presents today with tenderness and discomfort on the lateral aspect of her left hip.  Her discomfort has progressively been worsening over the past several weeks.  Her discomfort is most severe at night when lying on her sides.  She has significant difficulty moving around in bed due to severity of pain.  She denies any groin pain.  She states that she has been to to water therapy sessions since her last office visit.  She states that there has been several complex with scheduling due to the therapist being out of the office.  She states that she did notice improvement after going to water therapy and would like to continue as previously discussed. She continues to have generalized myalgias and muscle tenderness due to fibromyalgia.  Overall her symptoms have been unchanged.  She has had increased symptoms of neuropathy in both feet especially at night.  She continues to take gabapentin as prescribed.  She has been experiencing increased fatigue on a daily basis secondary to insomnia.  Since her last office visit she has retired since she has been able to rest more throughout the day.    Activities of Daily Living:  Patient reports morning stiffness for 2 hours  Patient Reports nocturnal pain.  Difficulty dressing/grooming: Denies Difficulty climbing stairs: Reports Difficulty getting out of chair: Reports Difficulty using hands for taps, buttons, cutlery, and/or writing: Reports  Review of Systems  Constitutional:  Positive for fatigue.  HENT:  Negative for mouth sores, mouth dryness  and nose dryness.   Eyes:  Positive for redness. Negative for pain, visual disturbance and dryness.  Respiratory:  Negative for cough, hemoptysis, shortness of breath and difficulty breathing.   Cardiovascular:  Negative for chest pain, palpitations, hypertension and swelling in legs/feet.  Gastrointestinal:  Negative for blood in stool, constipation and diarrhea.  Endocrine: Negative for increased urination.  Genitourinary:  Negative for painful urination.  Musculoskeletal:  Positive for joint pain, joint pain, myalgias, morning stiffness, muscle tenderness and myalgias. Negative for joint swelling and muscle weakness.  Skin:  Negative for color change, pallor, rash, hair loss, nodules/bumps, skin tightness, ulcers and sensitivity to sunlight.  Allergic/Immunologic: Negative for susceptible to infections.  Neurological:  Negative for dizziness, numbness, headaches and weakness.  Hematological:  Negative for swollen glands.  Psychiatric/Behavioral:  Positive for depressed mood and sleep disturbance. The patient is not nervous/anxious.    PMFS History:  Patient Active Problem List   Diagnosis Date Noted   Fibromyalgia 10/09/2016   Primary insomnia 10/09/2016   Other fatigue 10/09/2016   Tendinopathy of right shoulder 10/09/2016   Trochanteric bursitis of right hip 10/09/2016   Primary osteoarthritis of both hands 10/09/2016   Chronic kidney disease 10/09/2016   Essential hypertension 10/09/2016   Gastroesophageal reflux disease 10/09/2016   Restless leg syndrome 10/09/2016   Malignant neoplasm of female breast ([place]) 10/09/2016   History of breast cancer 10/09/2016   Genital herpes simplex 10/09/2016    Past Medical History:  Diagnosis Date  Arthritis    Breast cancer ([place])    Breast cancer ([place])    Broken toe    Fibromyalgia    Hypertension     History reviewed. No pertinent family history. Past Surgical History:  Procedure Laterality Date   ABDOMINAL HYSTERECTOMY      BREAST SURGERY     CESAREAN SECTION     ingrown toenail  03/21/2021   SMALL INTESTINE SURGERY     TONSILLECTOMY     Social History   Social History Narrative   Not on file   Immunization History  Administered Date(s) Administered   Moderna SARS-COV2 Booster Vaccination 05/14/2020   Moderna Sars-Covid-2 Vaccination 07/07/2019, 08/04/2019   PFIZER(Purple Top)SARS-COV-2 Vaccination 03/20/2021     Objective: Vital Signs: BP (!) 158/85 (BP Location: Right Arm, Patient Position: Sitting, Cuff Size: Large)   Pulse 69   Resp 13   Ht 6' (1.829 m)   Wt 233 lb 12.8 oz (106.1 kg)   BMI 31.71 kg/m    Physical Exam Vitals and nursing note reviewed.  Constitutional:      Appearance: She is well-developed.  HENT:     Head: Normocephalic and atraumatic.  Eyes:     Conjunctiva/sclera: Conjunctivae normal.  Pulmonary:     Effort: Pulmonary effort is normal.  Abdominal:     Palpations: Abdomen is soft.  Musculoskeletal:     Cervical back: Normal range of motion.  Skin:    General: Skin is warm and dry.     Capillary Refill: Capillary refill takes less than 2 seconds.  Neurological:     Mental Status: She is alert and oriented to person, place, and time.  Psychiatric:        Behavior: Behavior normal.     Musculoskeletal Exam: Generalized hyperalgesia and positive tender points on exam.  C-spine has limited range of motion with lateral rotation.  Shoulder joints have painful range of motion bilaterally.  Trapezius muscle tension and tenderness bilaterally.  Elbow joints, wrist joints, MCPs, PIPs, DIPs have good range of motion with no synovitis.  Complete fist formation bilaterally.  Hip joints have good range of motion with no groin pain.  Tenderness over bilateral trochanteric bursa, left greater than right.  Knee joints have good range of motion with no warmth or effusion.  Ankle joints have good range of motion with no tenderness or joint swelling.  CDAI Exam: CDAI Score:  -- Patient Global: --; Provider Global: -- Swollen: --; Tender: -- Joint Exam 05/22/2021   No joint exam has been documented for this visit   There is currently no information documented on the homunculus. Go to the Rheumatology activity and complete the homunculus joint exam.  Investigation: No additional findings.  Imaging: No results found.  Recent Labs: Lab Results  Component Value Date   WBC 4.8 09/01/2018   HGB 12.5 09/01/2018   PLT 258 09/01/2018   NA 143 09/01/2018   K 3.7 09/01/2018   CL 105 09/01/2018   CO2 30 09/01/2018   GLUCOSE 95 09/01/2018   BUN 13 09/01/2018   CREATININE 1.01 (H) 09/01/2018   BILITOT 0.6 09/01/2018   ALKPHOS 99 10/15/2016   AST 21 09/01/2018   ALT 16 09/01/2018   PROT 7.1 09/01/2018   ALBUMIN 4.4 10/15/2016   CALCIUM 9.5 09/01/2018   GFRAA 66 09/01/2018    Speciality Comments: No specialty comments available.  Procedures:  Large Joint Inj: L greater trochanter on 05/22/2021 1:23 PM Indications: pain Details: 27 G 1.5 in needle,  lateral approach  Arthrogram: No  Medications: 1.5 mL lidocaine 1 %; 40 mg triamcinolone acetonide 40 MG/ML Aspirate: 0 mL Outcome: tolerated well, no immediate complications Procedure, treatment alternatives, risks and benefits explained, specific risks discussed. Consent was given by the patient. Immediately prior to procedure a time out was called to verify the correct patient, procedure, equipment, support staff and site/side marked as required. Patient was prepped and draped in the usual sterile fashion.    Allergies: Codeine, Penicillins, Sulfa antibiotics, Tramadol, Aromasin [exemestane], Atorvastatin, Celexa [citalopram hydrobromide], Citalopram, and Tamoxifen   Assessment / Plan:     Visit Diagnoses: Fibromyalgia: She has generalized hyperalgesia and positive tender points on examination.  She continues to experience generalized myalgias and muscle tenderness due to fibromyalgia.  She presents  today with left trochanter bursitis of the left hip.  She requested a trochanteric bursa cortisone injection.  She tolerated procedure well.  Procedure note was completed above.  Aftercare was discussed.  Discussed the importance of regular exercise and good sleep hygiene.  She plans on going back to water therapy after Thanksgiving.  She will follow-up in the office in 3 months.  Other fatigue: Chronic and secondary to insomnia.  She takes trazodone 150 mg at bedtime to help her sleep.  Discussed the importance of good sleep hygiene.  Primary insomnia: She has difficulty sleeping at night due to nocturnal pain.  She has been having worsening symptoms of neuropathy in both feet.  She has been taking gabapentin and trazodone as prescribed.  Discussed the importance of good sleep hygiene.  Tendinopathy of right shoulder: She has painful range of motion of the right shoulder joint with some tenderness to palpation on examination today.  Trochanteric bursitis of both hips: She has tenderness palpation over bilateral trochanteric bursa.  She has has been experiencing significant discomfort on the lateral aspect of the left hip.  She requested a left trochanteric bursa cortisone injection today.  She tolerated procedure well.  Procedure note was completed above.  Aftercare was discussed.  Primary osteoarthritis of both hands: She has PIP and DIP prominence consistent with osteoarthritis of both hands.  Complete fist formation bilaterally.  Discussed the importance of joint protection and muscle strengthening.  Other medical conditions are listed as follows:  Elevated CK  Occasional tremors  Balance problems  History of gastroesophageal reflux (GERD)  History of hypertension: Discussed the importance of monitoring her blood pressure closely following the cortisone injection today.  Going to the patient and she took her blood pressure medication later than normal and has not kicked in yet.  Her blood  pressure has been well controlled at home.  Restless leg syndrome  History of breast cancer  Trochanteric bursitis, left hip - She presents today with left trochanter bursitis.  She requested a left trochanter bursa cortisone injection.  She tolerated the procedure well.  Procedure note completed above.  Aftercare was discussed.  Plan: Large Joint Inj: L greater trochanter  Orders: Orders Placed This Encounter  Procedures   Large Joint Inj: L greater trochanter   No orders of the defined types were placed in this encounter.     Follow-Up Instructions: Return in about 3 months (around 08/22/2021) for Fibromyalgia, Osteoarthritis.   Ofilia Neas, PA-C  Note - This record has been created using Dragon software.  Chart creation errors have been sought, but may not always  have been located. Such creation errors do not reflect on  the standard of medical care.

## 2021-05-22 ENCOUNTER — Encounter: Payer: Self-pay | Admitting: Physician Assistant

## 2021-05-22 ENCOUNTER — Other Ambulatory Visit: Payer: Self-pay

## 2021-05-22 ENCOUNTER — Ambulatory Visit: Payer: Medicare Other | Admitting: Physician Assistant

## 2021-05-22 VITALS — BP 158/85 | HR 69 | Resp 13 | Ht 72.0 in | Wt 233.8 lb

## 2021-05-22 DIAGNOSIS — F5101 Primary insomnia: Secondary | ICD-10-CM | POA: Diagnosis not present

## 2021-05-22 DIAGNOSIS — M7061 Trochanteric bursitis, right hip: Secondary | ICD-10-CM

## 2021-05-22 DIAGNOSIS — Z8719 Personal history of other diseases of the digestive system: Secondary | ICD-10-CM

## 2021-05-22 DIAGNOSIS — M797 Fibromyalgia: Secondary | ICD-10-CM

## 2021-05-22 DIAGNOSIS — R5383 Other fatigue: Secondary | ICD-10-CM | POA: Diagnosis not present

## 2021-05-22 DIAGNOSIS — G2581 Restless legs syndrome: Secondary | ICD-10-CM

## 2021-05-22 DIAGNOSIS — M19041 Primary osteoarthritis, right hand: Secondary | ICD-10-CM

## 2021-05-22 DIAGNOSIS — R2689 Other abnormalities of gait and mobility: Secondary | ICD-10-CM

## 2021-05-22 DIAGNOSIS — M7062 Trochanteric bursitis, left hip: Secondary | ICD-10-CM | POA: Diagnosis not present

## 2021-05-22 DIAGNOSIS — M67911 Unspecified disorder of synovium and tendon, right shoulder: Secondary | ICD-10-CM

## 2021-05-22 DIAGNOSIS — Z853 Personal history of malignant neoplasm of breast: Secondary | ICD-10-CM

## 2021-05-22 DIAGNOSIS — M19042 Primary osteoarthritis, left hand: Secondary | ICD-10-CM

## 2021-05-22 DIAGNOSIS — R748 Abnormal levels of other serum enzymes: Secondary | ICD-10-CM

## 2021-05-22 DIAGNOSIS — R251 Tremor, unspecified: Secondary | ICD-10-CM

## 2021-05-22 DIAGNOSIS — Z8679 Personal history of other diseases of the circulatory system: Secondary | ICD-10-CM

## 2021-05-22 MED ORDER — LIDOCAINE HCL 1 % IJ SOLN
1.5000 mL | INTRAMUSCULAR | Status: AC | PRN
  Administered 2021-05-22: 1.5 mL

## 2021-05-22 MED ORDER — TRIAMCINOLONE ACETONIDE 40 MG/ML IJ SUSP
40.0000 mg | INTRAMUSCULAR | Status: AC | PRN
  Administered 2021-05-22: 40 mg via INTRA_ARTICULAR

## 2021-05-22 NOTE — Patient Instructions (Signed)

## 2021-05-29 ENCOUNTER — Other Ambulatory Visit: Payer: Self-pay

## 2021-05-29 ENCOUNTER — Encounter (HOSPITAL_BASED_OUTPATIENT_CLINIC_OR_DEPARTMENT_OTHER): Payer: Self-pay | Admitting: Physical Therapy

## 2021-05-29 ENCOUNTER — Ambulatory Visit (HOSPITAL_BASED_OUTPATIENT_CLINIC_OR_DEPARTMENT_OTHER): Payer: Medicare Other | Admitting: Physical Therapy

## 2021-05-29 DIAGNOSIS — M545 Low back pain, unspecified: Secondary | ICD-10-CM

## 2021-05-29 DIAGNOSIS — G8929 Other chronic pain: Secondary | ICD-10-CM

## 2021-05-29 DIAGNOSIS — R293 Abnormal posture: Secondary | ICD-10-CM | POA: Diagnosis not present

## 2021-05-29 NOTE — Therapy (Signed)
OUTPATIENT PHYSICAL THERAPY TREATMENT NOTE   Patient Name: Whitney Ward MRN: [id number] DOB:1951-05-16, 70 y.o., female Today's Date: 05/29/2021  PCP: Rocky Morel, MD REFERRING PROVIDER: Huey Romans*   PT End of Session - 05/29/21 0900     Visit Number 4    Number of Visits 12    Date for PT Re-Evaluation 05/31/21    PT Start Time 4098   15 min late   PT Stop Time 0935    PT Time Calculation (min) 38 min    Activity Tolerance Patient tolerated treatment well    Behavior During Therapy [facility] for tasks assessed/performed             Past Medical History:  Diagnosis Date   Arthritis    Breast cancer ([place])    Breast cancer ([place])    Broken toe    Fibromyalgia    Hypertension    Past Surgical History:  Procedure Laterality Date   ABDOMINAL HYSTERECTOMY     BREAST SURGERY     CESAREAN SECTION     ingrown toenail  03/21/2021   SMALL INTESTINE SURGERY     TONSILLECTOMY     Patient Active Problem List   Diagnosis Date Noted   Fibromyalgia 10/09/2016   Primary insomnia 10/09/2016   Other fatigue 10/09/2016   Tendinopathy of right shoulder 10/09/2016   Trochanteric bursitis of right hip 10/09/2016   Primary osteoarthritis of both hands 10/09/2016   Chronic kidney disease 10/09/2016   Essential hypertension 10/09/2016   Gastroesophageal reflux disease 10/09/2016   Restless leg syndrome 10/09/2016   Malignant neoplasm of female breast ([place]) 10/09/2016   History of breast cancer 10/09/2016   Genital herpes simplex 10/09/2016     PCP: Rocky Morel, MD   REFERRING PROVIDER: Ofilia Neas, PA-C   REFERRING DIAG:  M79.7 (ICD-10-CM) - Fibromyalgia  M70.61,M70.62 (ICD-10-CM) - Trochanteric bursitis of both hips  M54.50,G89.29 (ICD-10-CM) - Chronic right-sided low back pain without sciatica      THERAPY DIAG:  Low back pain  Bilateral hip pain    ONSET DATE: 6-7 years prior   SUBJECTIVE:    SUBJECTIVE  STATEMENT: Patient had a shot in her left hip last week It has felt good since then. She continues to have some pain in her thighs   PERTINENT HISTORY: OA: Hips, Lower back, and shoulders; Breast Cancer in 2012 ; Broken toe last year and ankle;      PAIN:  Are you having pain? yes no significant change in pain from the last visit. VAS not formally assesed    VAS scale: 3/10  Pain location: thighs  Pain orientation: Left and right  PAIN TYPE: aching and burning Pain description: constant  Aggravating factors: sitting for too long or standing for too long  Relieving factors: patient can use heat       TODAY'S TREATMENT: 11/30 Ex bike 4 min Shoulder extension 3x10 10lbs  Shoulder row 3x10 10lbs  Leg press 3x10 30 lbs     05/01/21 Hip extension /flex stretch   Pt seen for aquatic therapy today.  Treatment took place in water 3.25-4 ft in depth at the [employer] pool. Temp of water was 91.  Pt entered/exited the pool via stairs (step through pattern) independently with bilat rail.   Warm up: heel/toe walking x4 laps across pool chest deep side stepping x4 laps from shallow to deep;  No ue support: long strides, march    Standing Hip flex and quad  stretching using noodle 3 x 30 sec hold Exercises: -hip flex pulling noodle x10 R/L   Supine suspension [place], Pt with lumbar belt around hips and nek doodle for neck support.  .  Pt assisted into supine floating position by lying head on shoulder of PT to get into floating position.  PT at torso and assisting with trunk left to right and vice versa to engage trunk muscles. PT then rotated trunk in order to engage abdomnal (internal and external obliques). -manual stretching into left side bending/stretching right. -Posterior chain glut strengthening x7-8 R/L. Verbal and TC for proper execution. Min assist initiating to gain understanding of proper tech and focus muscle.  Left glut fatigue upon completion. -Posterior  chain knee flex x 10 R/L     Pt requires buoyancy for support and to offload joints with strengthening exercises. Viscosity of the water is needed for resistance of strengthening; water current perturbations provides challenge to standing balance unsupported, requiring increased core activation     PATIENT EDUCATION:  Education details: reviewed PNE concepts; symptom management, HEP Person educated: Patient Education method: Explanation, Demonstration, Tactile cues, Verbal cues, and Handouts Education comprehension: verbalized understanding, returned demonstration, verbal cues required, tactile cues required, and needs further education     HEP:   access Code: [email or web address] URL: [email or web address] Date: 04/19/2021 Prepared by: Carolyne Littles   Program Notes [place]      Exercises Theracane Over Shoulder - 1 x daily - 7 x weekly - 3 sets - 10 reps Supine Lower Trunk Rotation - 1 x daily - 7 x weekly - 3 sets - 10 reps Seated Hamstring Stretch - 1 x daily - 7 x weekly - 3 sets - 10 reps     ASSESSMENT:   CLINICAL IMPRESSION: Patient tolerated treatment well. We were somewhat limited by time today. She was able to perform gym exercises with minimal pain. Her goal is to by the new year, to join a gym. We will continue to progress gym exercises as tolerated.   Abnormal gait, decreased activity tolerance, decreased endurance, difficulty walking, decreased ROM, decreased strength, increased fascial restrictions, increased muscle spasms, improper body mechanics, and pain   Time since onset of injury/illness/exacerbation and 3+ comorbidities: Fibromyalgia, multi joint OA; broken ankle and toe 2021    cleaning, community activity, meal prep, laundry, and yard work   [employer] POTENTIAL: Good   CLINICAL DECISION MAKING: Evolving/moderate complexity increasing difficulty standing walking and sitting    EVALUATION COMPLEXITY: Moderate     GOALS: Goals reviewed with patient?  Yes   SHORT TERM GOALS:   STG Name Target Date Goal status  1 Patient will increase pain free hip motion to 90 degrees  Baseline:  05/10/2021 INITIAL  2 Patient will increase bilateral hip flexion and abduction strength by 5 lbs peak torque  Baseline:  05/10/2021 INITIAL  3 Patient will increase lumbar flexion by 10 degrees  Baseline: 05/10/2021 INITIAL      LONG TERM GOALS:    LTG Name Target Date Goal status  1 Patient will be independent with complete HEP for water and the gym  Baseline: 05/31/2021 INITIAL  2 Patient will sit for 30 min without self reported pain  Baseline: 05/31/2021 INITIAL  3 Patient will stand for 30 min without self reported pain in order to perform ADL;s  Baseline: 05/31/2021 INITIAL  PLAN: PT FREQUENCY: 2x/week   PT DURATION: 6 weeks   PLANNED INTERVENTIONS: Therapeutic exercises, Therapeutic activity, Neuro Muscular re-education, Balance training, Gait training,  Patient/Family education, Joint mobilization, Stair training, Aquatic Therapy, Dry Needling, Electrical stimulation, Cryotherapy, Moist heat, Taping, Traction, Ultrasound, and Manual therapy   PLAN FOR NEXT SESSION: Progress gait training and core strengthening     Carney Living PT DPT  05/29/2021, 2:27 PM

## 2021-06-05 ENCOUNTER — Other Ambulatory Visit: Payer: Self-pay

## 2021-06-05 ENCOUNTER — Ambulatory Visit (HOSPITAL_BASED_OUTPATIENT_CLINIC_OR_DEPARTMENT_OTHER): Payer: Medicare Other | Attending: Physician Assistant | Admitting: Physical Therapy

## 2021-06-05 ENCOUNTER — Encounter (HOSPITAL_BASED_OUTPATIENT_CLINIC_OR_DEPARTMENT_OTHER): Payer: Self-pay | Admitting: Physical Therapy

## 2021-06-05 DIAGNOSIS — R293 Abnormal posture: Secondary | ICD-10-CM | POA: Diagnosis present

## 2021-06-05 DIAGNOSIS — R2689 Other abnormalities of gait and mobility: Secondary | ICD-10-CM | POA: Insufficient documentation

## 2021-06-05 DIAGNOSIS — M545 Low back pain, unspecified: Secondary | ICD-10-CM | POA: Diagnosis present

## 2021-06-05 DIAGNOSIS — M25561 Pain in right knee: Secondary | ICD-10-CM | POA: Diagnosis present

## 2021-06-05 DIAGNOSIS — M25562 Pain in left knee: Secondary | ICD-10-CM | POA: Insufficient documentation

## 2021-06-05 DIAGNOSIS — G8929 Other chronic pain: Secondary | ICD-10-CM | POA: Insufficient documentation

## 2021-06-05 NOTE — Therapy (Signed)
OUTPATIENT PHYSICAL THERAPY TREATMENT NOTE   Patient Name: Whitney Ward MRN: [id number] DOB:April 13, 1951, 70 y.o., female Today's Date: 06/05/2021  PCP: Rocky Morel, MD REFERRING PROVIDER: Huey Romans*    Past Medical History:  Diagnosis Date   Arthritis    Breast cancer ([place])    Breast cancer ([place])    Broken toe    Fibromyalgia    Hypertension    Past Surgical History:  Procedure Laterality Date   ABDOMINAL HYSTERECTOMY     BREAST SURGERY     CESAREAN SECTION     ingrown toenail  03/21/2021   SMALL INTESTINE SURGERY     TONSILLECTOMY     Patient Active Problem List   Diagnosis Date Noted   Fibromyalgia 10/09/2016   Primary insomnia 10/09/2016   Other fatigue 10/09/2016   Tendinopathy of right shoulder 10/09/2016   Trochanteric bursitis of right hip 10/09/2016   Primary osteoarthritis of both hands 10/09/2016   Chronic kidney disease 10/09/2016   Essential hypertension 10/09/2016   Gastroesophageal reflux disease 10/09/2016   Restless leg syndrome 10/09/2016   Malignant neoplasm of female breast ([place]) 10/09/2016   History of breast cancer 10/09/2016   Genital herpes simplex 10/09/2016     PCP: Rocky Morel, MD   REFERRING PROVIDER: Ofilia Neas, PA-C   REFERRING DIAG:  M79.7 (ICD-10-CM) - Fibromyalgia  M70.61,M70.62 (ICD-10-CM) - Trochanteric bursitis of both hips  M54.50,G89.29 (ICD-10-CM) - Chronic right-sided low back pain without sciatica      THERAPY DIAG:  Low back pain  Bilateral hip pain    ONSET DATE: 6-7 years prior   SUBJECTIVE:    SUBJECTIVE STATEMENT: Patient reports she is just tired today. She had some soreness in her calf after the last visit. Overall today she reports general pain.   PERTINENT HISTORY: OA: Hips, Lower back, and shoulders; Breast Cancer in 2012 ; Broken toe last year and ankle;      PAIN:  Are you having pain? yes no significant change in pain from the last visit.  VAS not formally assesed    VAS scale: no pain level given  Pain location: general  Pain orientation: Left and right  PAIN TYPE: aching and burning Pain description: constant  Aggravating factors: sitting for too long or standing for too long  Relieving factors:     Objective    (Blank rows = not tested)   LE MMT:   MMT Right 04/18/2021 Left 04/18/2021  Hip flexion 14.9 12/7  21.2 14.5 12/7 23.8  Hip extension      Hip abduction 19.8  25.2 23.8  26.5  Hip adduction      Hip internal rotation      Hip external rotation      Knee flexion      Knee extension 19.6  20.9 18.2  24.8  Ankle dorsiflexion      Ankle plantarflexion      Ankle inversion      Ankle eversion        FOTO: 44% at  baseline 56% at 5 visits    TODAY'S TREATMENT: 12/7/ Exercise bike x5 min  LTR x20  Piriformis stretch 2x30 sec hold  Row machine 3x10 20 lbs 1st set 30 lbs 2nd set      11/30 Ex bike 4 min Shoulder extension 3x10 10lbs  Shoulder row 3x10 10lbs  Leg press 3x10 30 lbs      05/01/21 Hip extension /flex stretch   Pt seen for aquatic therapy  today.  Treatment took place in water 3.25-4 ft in depth at the [facility] pool. Temp of water was 91.  Pt entered/exited the pool via stairs (step through pattern) independently with bilat rail.   Warm up: heel/toe walking x4 laps across pool chest deep side stepping x4 laps from shallow to deep;  No ue support: long strides, march    Standing Hip flex and quad stretching using noodle 3 x 30 sec hold Exercises: -hip flex pulling noodle x10 R/L   Supine suspension Bad Ragaz, Pt with lumbar belt around hips and nek doodle for neck support.  .  Pt assisted into supine floating position by lying head on shoulder of PT to get into floating position.  PT at torso and assisting with trunk left to right and vice versa to engage trunk muscles. PT then rotated trunk in order to engage abdomnal (internal and external  obliques). -manual stretching into left side bending/stretching right. -Posterior chain glut strengthening x7-8 R/L. Verbal and TC for proper execution. Min assist initiating to gain understanding of proper tech and focus muscle.  Left glut fatigue upon completion. -Posterior chain knee flex x 10 R/L     Pt requires buoyancy for support and to offload joints with strengthening exercises. Viscosity of the water is needed for resistance of strengthening; water current perturbations provides challenge to standing balance unsupported, requiring increased core activation     PATIENT EDUCATION:  Education details: reviewed PNE concepts; symptom management, HEP Person educated: Patient Education method: Explanation, Demonstration, Tactile cues, Verbal cues, and Handouts Education comprehension: verbalized understanding, returned demonstration, verbal cues required, tactile cues required, and needs further education     HEP:   access Code: [email or web address] URL: [email or web address] Date: 04/19/2021 Prepared by: Carolyne Littles   Program Notes Liba      Exercises Theracane Over Shoulder - 1 x daily - 7 x weekly - 3 sets - 10 reps Supine Lower Trunk Rotation - 1 x daily - 7 x weekly - 3 sets - 10 reps Seated Hamstring Stretch - 1 x daily - 7 x weekly - 3 sets - 10 reps     ASSESSMENT:   CLINICAL IMPRESSION: Therapy continues to expose patient to different gym exercises and machines. She tolerated well. She had no significant increase in pain. Therapy reviewed hip abduction machine, rows and pull down machine. She had no increase in pain. Therapy performed a re-certification on her today. In 5 visits she has reached her goal for FOTO. Her left hip strength has improved almost 100% from baseline. Her hip abduction  the left has improved but not as much as her other strength measurements. Therapy will progress as tolerated.  Abnormal gait, decreased activity tolerance, decreased  endurance, difficulty walking, decreased ROM, decreased strength, increased fascial restrictions, increased muscle spasms, improper body mechanics, and pain   Time since onset of injury/illness/exacerbation and 3+ comorbidities: Fibromyalgia, multi joint OA; broken ankle and toe 2021    cleaning, community activity, meal prep, laundry, and yard work   [employer] POTENTIAL: Good   CLINICAL DECISION MAKING: Evolving/moderate complexity increasing difficulty standing walking and sitting    EVALUATION COMPLEXITY: Moderate     GOALS: Goals reviewed with patient? Yes   SHORT TERM GOALS:   STG Name Target Date Goal status  1 Patient will increase pain free hip motion to 90 degrees  Baseline:  05/10/2021 Not measured this visit will measure the next   2 Patient will increase bilateral hip flexion and abduction strength by  5 lbs peak torque  Baseline:  05/10/2021 Partially met  Left hip abduction 3 lbs all others over 5   3 Patient will increase lumbar flexion by 10 degrees  Baseline: 05/10/2021 Not measured       LONG TERM GOALS:    LTG Name Target Date Goal status  1 Patient will be independent with complete HEP for water and the gym  Baseline: 05/31/2021 INITIAL  2 Patient will sit for 30 min without self reported pain  Baseline: 05/31/2021 INITIAL  3 Patient will stand for 30 min without self reported pain in order to perform ADL;s  Baseline: 05/31/2021 INITIAL  PLAN: PT FREQUENCY: 2x/week   PT DURATION: 6 weeks   PLANNED INTERVENTIONS: Therapeutic exercises, Therapeutic activity, Neuro Muscular re-education, Balance training, Gait training, Patient/Family education, Joint mobilization, Stair training, Aquatic Therapy, Dry Needling, Electrical stimulation, Cryotherapy, Moist heat, Taping, Traction, Ultrasound, and Manual therapy   PLAN FOR NEXT SESSION: Progress gait training and core strengthening     Carney Living PT DPT  06/05/2021, 9:40 AM

## 2021-06-12 ENCOUNTER — Other Ambulatory Visit: Payer: Self-pay

## 2021-06-12 ENCOUNTER — Encounter (HOSPITAL_BASED_OUTPATIENT_CLINIC_OR_DEPARTMENT_OTHER): Payer: Self-pay | Admitting: Physical Therapy

## 2021-06-12 ENCOUNTER — Ambulatory Visit (HOSPITAL_BASED_OUTPATIENT_CLINIC_OR_DEPARTMENT_OTHER): Payer: Medicare Other | Admitting: Physical Therapy

## 2021-06-12 DIAGNOSIS — R2689 Other abnormalities of gait and mobility: Secondary | ICD-10-CM

## 2021-06-12 DIAGNOSIS — M25561 Pain in right knee: Secondary | ICD-10-CM | POA: Diagnosis not present

## 2021-06-12 DIAGNOSIS — G8929 Other chronic pain: Secondary | ICD-10-CM

## 2021-06-12 DIAGNOSIS — M25562 Pain in left knee: Secondary | ICD-10-CM

## 2021-06-12 NOTE — Therapy (Signed)
OUTPATIENT PHYSICAL THERAPY TREATMENT NOTE   Patient Name: Whitney Ward MRN: [id number] DOB:Jul 22, 1950, 70 y.o., female Today's Date: 06/12/2021  PCP: Rocky Morel, MD REFERRING PROVIDER: Huey Romans*   PT End of Session - 06/12/21 0933     Visit Number 6    Number of Visits 12    Date for PT Re-Evaluation 07/17/21    PT Start Time 0930    PT Stop Time 1012    PT Time Calculation (min) 42 min    Activity Tolerance Patient tolerated treatment well    Behavior During Therapy [facility] for tasks assessed/performed             Past Medical History:  Diagnosis Date   Arthritis    Breast cancer ([place])    Breast cancer ([place])    Broken toe    Fibromyalgia    Hypertension    Past Surgical History:  Procedure Laterality Date   ABDOMINAL HYSTERECTOMY     BREAST SURGERY     CESAREAN SECTION     ingrown toenail  03/21/2021   SMALL INTESTINE SURGERY     TONSILLECTOMY     Patient Active Problem List   Diagnosis Date Noted   Fibromyalgia 10/09/2016   Primary insomnia 10/09/2016   Other fatigue 10/09/2016   Tendinopathy of right shoulder 10/09/2016   Trochanteric bursitis of right hip 10/09/2016   Primary osteoarthritis of both hands 10/09/2016   Chronic kidney disease 10/09/2016   Essential hypertension 10/09/2016   Gastroesophageal reflux disease 10/09/2016   Restless leg syndrome 10/09/2016   Malignant neoplasm of female breast ([place]) 10/09/2016   History of breast cancer 10/09/2016   Genital herpes simplex 10/09/2016     PCP: Rocky Morel, MD   REFERRING PROVIDER: Ofilia Neas, PA-C   REFERRING DIAG:  M79.7 (ICD-10-CM) - Fibromyalgia  M70.61,M70.62 (ICD-10-CM) - Trochanteric bursitis of both hips  M54.50,G89.29 (ICD-10-CM) - Chronic right-sided low back pain without sciatica      THERAPY DIAG:  Low back pain  Bilateral hip pain    ONSET DATE: 6-7 years prior   SUBJECTIVE:    SUBJECTIVE STATEMENT: Patient  reports mild pain in her left hip. It has been going on for a few days now. The pain is more anterior.     PERTINENT HISTORY: OA: Hips, Lower back, and shoulders; Breast Cancer in 2012 ; Broken toe last year and ankle;      PAIN:  Are you having pain? yes no significant change in pain from the last visit. VAS not formally assesed    VAS scale: 3/10  Pain location:Left anterior hip  Pain orientation: Left and right  PAIN TYPE: aching and burning Pain description: constant  Aggravating factors: standing ind walking  Relieving factors: rest     Objective     (Blank rows = not tested)   TODAY'S TREATMENT: 12/14:  Nu-step: 5 min L3 UE and LE   Stretches  LTR x20  Anterior hip stretch 2x20 sec hold  Supine March x20  Bridge 2x10   Cable Machine 2x10 10 lbs  Cable row 2x10 10 lbs   Row machine 2x10 30 lbs  Hip abdcution machine 30 lbs 3x10   Leg press 3x10 50 lbs         12/7/ Exercise bike x5 min  LTR x20  Piriformis stretch 2x30 sec hold  Row machine 3x10 20 lbs 1st set 30 lbs 2nd set          11/30 Ex  bike 4 min Shoulder extension 3x10 10lbs  Shoulder row 3x10 10lbs  Leg press 3x10 30 lbs      05/01/21 Hip extension /flex stretch   Pt seen for aquatic therapy today.  Treatment took place in water 3.25-4 ft in depth at the [facility] pool. Temp of water was 91.  Pt entered/exited the pool via stairs (step through pattern) independently with bilat rail.   Warm up: heel/toe walking x4 laps across pool chest deep side stepping x4 laps from shallow to deep;  No ue support: long strides, march    Standing Hip flex and quad stretching using noodle 3 x 30 sec hold Exercises: -hip flex pulling noodle x10 R/L   Supine suspension Bad Ragaz, Pt with lumbar belt around hips and nek doodle for neck support.  .  Pt assisted into supine floating position by lying head on shoulder of PT to get into floating position.  PT at torso and assisting with  trunk left to right and vice versa to engage trunk muscles. PT then rotated trunk in order to engage abdomnal (internal and external obliques). -manual stretching into left side bending/stretching right. -Posterior chain glut strengthening x7-8 R/L. Verbal and TC for proper execution. Min assist initiating to gain understanding of proper tech and focus muscle.  Left glut fatigue upon completion. -Posterior chain knee flex x 10 R/L     Pt requires buoyancy for support and to offload joints with strengthening exercises. Viscosity of the water is needed for resistance of strengthening; water current perturbations provides challenge to standing balance unsupported, requiring increased core activation     PATIENT EDUCATION:  Education details: reviewed PNE concepts; symptom management, HEP Person educated: Patient Education method: Explanation, Demonstration, Tactile cues, Verbal cues, and Handouts Education comprehension: verbalized understanding, returned demonstration, verbal cues required, tactile cues required, and needs further education     HEP:   access Code: [email or web address] URL: [email or web address] Date: 04/19/2021 Prepared by: Carolyne Littles   Program Notes Liba      Exercises Theracane Over Shoulder - 1 x daily - 7 x weekly - 3 sets - 10 reps Supine Lower Trunk Rotation - 1 x daily - 7 x weekly - 3 sets - 10 reps Seated Hamstring Stretch - 1 x daily - 7 x weekly - 3 sets - 10 reps     ASSESSMENT:   CLINICAL IMPRESSION: Patient is making very good progress. She was shown how to do an anterior hip stretch for her pain today. She has joined [employer]. Therapy reviewed gym exercises with her. She did very well. We reviewed set up and weights. She was advised to start with a comfortable weight she can do 3x10 then progress form there. She will continue to work on building towards an independent program.    Abnormal gait, decreased activity tolerance, decreased  endurance, difficulty walking, decreased ROM, decreased strength, increased fascial restrictions, increased muscle spasms, improper body mechanics, and pain   Time since onset of injury/illness/exacerbation and 3+ comorbidities: Fibromyalgia, multi joint OA; broken ankle and toe 2021    cleaning, community activity, meal prep, laundry, and yard work   [employer] POTENTIAL: Good   CLINICAL DECISION MAKING: Evolving/moderate complexity increasing difficulty standing walking and sitting    EVALUATION COMPLEXITY: Moderate     GOALS: Goals reviewed with patient? Yes   SHORT TERM GOALS:   STG Name Target Date Goal status  1 Patient will increase pain free hip motion to 90 degrees  Baseline:  05/10/2021 Not  measured this visit will measure the next   2 Patient will increase bilateral hip flexion and abduction strength by 5 lbs peak torque  Baseline:  05/10/2021 Partially met  Left hip abduction 3 lbs all others over 5   3 Patient will increase lumbar flexion by 10 degrees  Baseline: 05/10/2021 Not measured       LONG TERM GOALS:    LTG Name Target Date Goal status  1 Patient will be independent with complete HEP for water and the gym  Baseline: 05/31/2021 INITIAL  2 Patient will sit for 30 min without self reported pain  Baseline: 05/31/2021 INITIAL  3 Patient will stand for 30 min without self reported pain in order to perform ADL;s  Baseline: 05/31/2021 INITIAL  PLAN: PT FREQUENCY: 2x/week   PT DURATION: 6 weeks   PLANNED INTERVENTIONS: Therapeutic exercises, Therapeutic activity, Neuro Muscular re-education, Balance training, Gait training, Patient/Family education, Joint mobilization, Stair training, Aquatic Therapy, Dry Needling, Electrical stimulation, Cryotherapy, Moist heat, Taping, Traction, Ultrasound, and Manual therapy   PLAN FOR NEXT SESSION: Progress gait training and core strengthening    Carney Living 06/12/2021, 9:50 AM

## 2021-06-19 ENCOUNTER — Other Ambulatory Visit: Payer: Self-pay

## 2021-06-19 ENCOUNTER — Encounter (HOSPITAL_BASED_OUTPATIENT_CLINIC_OR_DEPARTMENT_OTHER): Payer: Self-pay | Admitting: Physical Therapy

## 2021-06-19 ENCOUNTER — Ambulatory Visit (HOSPITAL_BASED_OUTPATIENT_CLINIC_OR_DEPARTMENT_OTHER): Payer: Medicare Other | Admitting: Physical Therapy

## 2021-06-19 DIAGNOSIS — G8929 Other chronic pain: Secondary | ICD-10-CM

## 2021-06-19 DIAGNOSIS — R2689 Other abnormalities of gait and mobility: Secondary | ICD-10-CM

## 2021-06-19 DIAGNOSIS — M25561 Pain in right knee: Secondary | ICD-10-CM | POA: Diagnosis not present

## 2021-06-19 DIAGNOSIS — M25562 Pain in left knee: Secondary | ICD-10-CM

## 2021-06-19 NOTE — Therapy (Signed)
OUTPATIENT PHYSICAL THERAPY TREATMENT NOTE/Discharge   Patient Name: Whitney Ward MRN: [id number] DOB:1951/06/30, 70 y.o., female Today's Date: 06/19/2021  PCP: Rocky Morel, MD REFERRING PROVIDER: Huey Romans*   PT End of Session - 06/19/21 1505     Visit Number 7    Number of Visits 12    Date for PT Re-Evaluation 07/17/21    PT Start Time 0937   Patient 7 minutes late   PT Stop Time 1015    PT Time Calculation (min) 38 min    Activity Tolerance Patient tolerated treatment well    Behavior During Therapy [facility] for tasks assessed/performed             Past Medical History:  Diagnosis Date   Arthritis    Breast cancer ([place])    Breast cancer ([place])    Broken toe    Fibromyalgia    Hypertension    Past Surgical History:  Procedure Laterality Date   ABDOMINAL HYSTERECTOMY     BREAST SURGERY     CESAREAN SECTION     ingrown toenail  03/21/2021   SMALL INTESTINE SURGERY     TONSILLECTOMY     Patient Active Problem List   Diagnosis Date Noted   Fibromyalgia 10/09/2016   Primary insomnia 10/09/2016   Other fatigue 10/09/2016   Tendinopathy of right shoulder 10/09/2016   Trochanteric bursitis of right hip 10/09/2016   Primary osteoarthritis of both hands 10/09/2016   Chronic kidney disease 10/09/2016   Essential hypertension 10/09/2016   Gastroesophageal reflux disease 10/09/2016   Restless leg syndrome 10/09/2016   Malignant neoplasm of female breast ([place]) 10/09/2016   History of breast cancer 10/09/2016   Genital herpes simplex 10/09/2016     PCP: Rocky Morel, MD   REFERRING PROVIDER: Ofilia Neas, PA-C   REFERRING DIAG:  M79.7 (ICD-10-CM) - Fibromyalgia  M70.61,M70.62 (ICD-10-CM) - Trochanteric bursitis of both hips  M54.50,G89.29 (ICD-10-CM) - Chronic right-sided low back pain without sciatica      THERAPY DIAG:  Low back pain  Bilateral hip pain    ONSET DATE: 6-7 years prior   SUBJECTIVE:     SUBJECTIVE STATEMENT: Patient reports mild pain in her left hip. It has been going on for a few days now. The pain is more anterior.      PERTINENT HISTORY: OA: Hips, Lower back, and shoulders; Breast Cancer in 2012 ; Broken toe last year and ankle;      PAIN:  Are you having pain? yes no significant change in pain from the last visit. VAS not formally assesed    VAS scale: 3/10  Pain location:Left anterior hip  Pain orientation: Left and right  PAIN TYPE: aching and burning Pain description: constant  Aggravating factors: standing ind walking  Relieving factors: rest     Objective     (Blank rows = not tested)   TODAY'S TREATMENT: 12/14:  Nu-step: 5 min L3 UE and LE    Stretches  LTR x20  Anterior hip stretch 2x20 sec hold   Supine March x20  Bridge 2x10    Cable Machine 2x10 10 lbs  Cable row 2x10 10 lbs    Row machine 2x10 30 lbs  Hip abdcution machine 30 lbs 3x10    Leg press 3x10 50 lbs              12/7/ Exercise bike x5 min  LTR x20  Piriformis stretch 2x30 sec hold  Row machine 3x10 20 lbs 1st  set 30 lbs 2nd set          11/30 Ex bike 4 min Shoulder extension 3x10 10lbs  Shoulder row 3x10 10lbs  Leg press 3x10 30 lbs      05/01/21 Hip extension /flex stretch   Pt seen for aquatic therapy today.  Treatment took place in water 3.25-4 ft in depth at the [facility] pool. Temp of water was 91.  Pt entered/exited the pool via stairs (step through pattern) independently with bilat rail.   Warm up: heel/toe walking x4 laps across pool chest deep side stepping x4 laps from shallow to deep;  No ue support: long strides, march    Standing Hip flex and quad stretching using noodle 3 x 30 sec hold Exercises: -hip flex pulling noodle x10 R/L   Supine suspension Bad Ragaz, Pt with lumbar belt around hips and nek doodle for neck support.  .  Pt assisted into supine floating position by lying head on shoulder of PT to get into floating  position.  PT at torso and assisting with trunk left to right and vice versa to engage trunk muscles. PT then rotated trunk in order to engage abdomnal (internal and external obliques). -manual stretching into left side bending/stretching right. -Posterior chain glut strengthening x7-8 R/L. Verbal and TC for proper execution. Min assist initiating to gain understanding of proper tech and focus muscle.  Left glut fatigue upon completion. -Posterior chain knee flex x 10 R/L     Pt requires buoyancy for support and to offload joints with strengthening exercises. Viscosity of the water is needed for resistance of strengthening; water current perturbations provides challenge to standing balance unsupported, requiring increased core activation     PATIENT EDUCATION:  Education details:  reviewed final HEP; progression of activity  Person educated: Patient Education method: Explanation, Demonstration, Handouts Education comprehension: verbalized understanding, returned demonstration,  HEP:   access Code: [email or web address] URL: [email or web address] Date: 04/19/2021 Prepared by: Carolyne Littles   Program Notes Liba      Exercises Theracane Over Shoulder - 1 x daily - 7 x weekly - 3 sets - 10 reps Supine Lower Trunk Rotation - 1 x daily - 7 x weekly - 3 sets - 10 reps Seated Hamstring Stretch - 1 x daily - 7 x weekly - 3 sets - 10 reps     ASSESSMENT:   CLINICAL IMPRESSION: The patient hs progressed very well. She is back to the gym and feels independent with her program. At this time she feels like she is ready for D/C. We reviewed  her complete HEP today. We reviewed how to progress exercsies. She was given an updated HEP. See below for goal specific progress.      Abnormal gait, decreased activity tolerance, decreased endurance, difficulty walking, decreased ROM, decreased strength, increased fascial restrictions, increased muscle spasms, improper body mechanics, and pain   Time  since onset of injury/illness/exacerbation and 3+ comorbidities: Fibromyalgia, multi joint OA; broken ankle and toe 2021    cleaning, community activity, meal prep, laundry, and yard work   [employer] POTENTIAL: Good   CLINICAL DECISION MAKING: Evolving/moderate complexity increasing difficulty standing walking and sitting    EVALUATION COMPLEXITY: Moderate     GOALS: Goals reviewed with patient? Yes   SHORT TERM GOALS:   STG Name Target Date Goal status  1 Patient will increase pain free hip motion to 90 degrees  Baseline:  05/10/2021 90 degrees without pain  Achieved   2 Patient will increase bilateral hip  flexion and abduction strength by 5 lbs peak torque  Baseline:  05/10/2021 Partially met  Left hip abduction 3 lbs all others over 5   3 Patient will increase lumbar flexion by 10 degrees  Baseline: 05/10/2021 Full without pain       LONG TERM GOALS:    LTG Name Target Date Goal status  1 Patient will be independent with complete HEP for water and the gym  Baseline: 05/31/2021 Has full program  Achieved   2 Patient will sit for 30 min without self reported pain  Baseline: 05/31/2021 No significant pain   3 Patient will stand for 30 min without self reported pain in order to perform ADL;s  Baseline: 05/31/2021 No significant pain  Achieved   PLAN: PT FREQUENCY: 2x/week   PT DURATION: 6 weeks   PLANNED INTERVENTIONS: Therapeutic exercises, Therapeutic activity, Neuro Muscular re-education, Balance training, Gait training, Patient/Family education, Joint mobilization, Stair training, Aquatic Therapy, Dry Needling, Electrical stimulation, Cryotherapy, Moist heat, Taping, Traction, Ultrasound, and Manual therapy   PLAN FOR NEXT SESSION: Progress gait training and core strengthening  PHYSICAL THERAPY DISCHARGE SUMMARY  Visits from Start of Care: 7  Current functional level related to goals / functional outcomes Significant improvement in function and motion. Has returned to  the gym    Remaining deficits: Nothing at this time but her pain does flair up    Education / Equipment:HEP   Patient agrees to discharge. Patient goals were met. Patient is being discharged due to meeting the stated rehab goals.    Carney Living PT DPT  06/19/2021, 4:13 PM

## 2021-06-27 ENCOUNTER — Ambulatory Visit (HOSPITAL_BASED_OUTPATIENT_CLINIC_OR_DEPARTMENT_OTHER): Payer: Medicare Other | Admitting: Physical Therapy

## 2021-08-08 NOTE — Progress Notes (Signed)
Office Visit Note  Patient: Whitney Ward             Date of Birth: [birth year]           MRN: [id number]             PCP: Rocky Morel, MD Referring: Huey Romans* Visit Date: 08/21/2021 Occupation: @GUAROCC @  Subjective:  Pain in both arms and legs  History of Present Illness: Whitney Ward is a 71 y.o. female with a history of fibromyalgia and osteoarthritis.  She states she has been having pain and discomfort in her both arms and legs.  She has she describes discomfort over bilateral trochanteric bursa.  She has difficulty sleeping on her sides.  She has nocturnal pain.  She went to integrative therapies and finish sessions in December.  She states integrative therapy was helpful.  She is having a flare of fibromyalgia with generalized pain and discomfort.  She continues to have fatigue and insomnia.  She has been going to the gym on a regular basis.  Activities of Daily Living:  Patient reports morning stiffness for 1 hour.   Patient Reports nocturnal pain.  Difficulty dressing/grooming: Denies Difficulty climbing stairs: Reports Difficulty getting out of chair: Reports Difficulty using hands for taps, buttons, cutlery, and/or writing: Reports  Review of Systems  Constitutional:  Positive for fatigue.  HENT:  Negative for mouth sores, mouth dryness and nose dryness.   Eyes:  Negative for pain, itching and dryness.  Respiratory:  Negative for difficulty breathing.   Cardiovascular:  Negative for chest pain and palpitations.  Gastrointestinal:  Negative for blood in stool, constipation and diarrhea.  Endocrine: Negative for increased urination.  Genitourinary:  Negative for difficulty urinating.  Musculoskeletal:  Positive for joint pain, joint pain, myalgias, morning stiffness and myalgias. Negative for joint swelling and muscle tenderness.  Skin:  Negative for color change, rash, redness and sensitivity to sunlight.  Allergic/Immunologic:  Negative for susceptible to infections.  Neurological:  Positive for weakness. Negative for dizziness, numbness, headaches and memory loss.  Hematological:  Positive for bruising/bleeding tendency.  Psychiatric/Behavioral:  Positive for depressed mood and sleep disturbance. Negative for confusion. The patient is not nervous/anxious.    PMFS History:  Patient Active Problem List   Diagnosis Date Noted   Fibromyalgia 10/09/2016   Primary insomnia 10/09/2016   Other fatigue 10/09/2016   Tendinopathy of right shoulder 10/09/2016   Trochanteric bursitis of right hip 10/09/2016   Primary osteoarthritis of both hands 10/09/2016   Chronic kidney disease 10/09/2016   Essential hypertension 10/09/2016   Gastroesophageal reflux disease 10/09/2016   Restless leg syndrome 10/09/2016   Malignant neoplasm of female breast ([place]) 10/09/2016   History of breast cancer 10/09/2016   Genital herpes simplex 10/09/2016    Past Medical History:  Diagnosis Date   Arthritis    Breast cancer ([place])    Breast cancer ([place])    Broken toe    Fibromyalgia    Hypertension     History reviewed. No pertinent family history. Past Surgical History:  Procedure Laterality Date   ABDOMINAL HYSTERECTOMY     BREAST SURGERY     CESAREAN SECTION     ingrown toenail  03/21/2021   SMALL INTESTINE SURGERY     TONSILLECTOMY     Social History   Social History Narrative   Not on file   Immunization History  Administered Date(s) Administered   Moderna SARS-COV2 Booster Vaccination 05/14/2020   Moderna Sars-Covid-2 Vaccination 07/07/2019,  08/04/2019   PFIZER(Purple Top)SARS-COV-2 Vaccination 03/20/2021     Objective: Vital Signs: BP 137/84 (BP Location: Right Arm, Patient Position: Sitting, Cuff Size: Normal)    Pulse 88    Ht 6' (1.829 m)    Wt 233 lb 12.8 oz (106.1 kg)    BMI 31.71 kg/m    Physical Exam Vitals and nursing note reviewed.  Constitutional:      Appearance: She is well-developed.  HENT:      Head: Normocephalic and atraumatic.  Eyes:     Conjunctiva/sclera: Conjunctivae normal.  Cardiovascular:     Rate and Rhythm: Normal rate and regular rhythm.     Heart sounds: Normal heart sounds.  Pulmonary:     Effort: Pulmonary effort is normal.     Breath sounds: Normal breath sounds.  Abdominal:     General: Bowel sounds are normal.     Palpations: Abdomen is soft.  Musculoskeletal:     Cervical back: Normal range of motion.  Lymphadenopathy:     Cervical: No cervical adenopathy.  Skin:    General: Skin is warm and dry.     Capillary Refill: Capillary refill takes less than 2 seconds.  Neurological:     Mental Status: She is alert and oriented to person, place, and time.  Psychiatric:        Behavior: Behavior normal.     Musculoskeletal Exam: Spine was in good range of motion.  Shoulder joints, elbow joints Barras joints, MCPs PIPs and DIPs with good range of motion with no synovitis.  Hip joints, knee joints, ankles, MTPs and PIPs with good range of motion with no synovitis.  There was no muscular weakness or tenderness.  She had generalized hyperalgesia and positive tender points.  She had tenderness over bilateral trochanteric bursa and trapezius region.  CDAI Exam: CDAI Score: -- Patient Global: --; Provider Global: -- Swollen: --; Tender: -- Joint Exam 08/21/2021   No joint exam has been documented for this visit   There is currently no information documented on the homunculus. Go to the Rheumatology activity and complete the homunculus joint exam.  Investigation: No additional findings.  Imaging: No results found.  Recent Labs: Lab Results  Component Value Date   WBC 4.8 09/01/2018   HGB 12.5 09/01/2018   PLT 258 09/01/2018   NA 143 09/01/2018   K 3.7 09/01/2018   CL 105 09/01/2018   CO2 30 09/01/2018   GLUCOSE 95 09/01/2018   BUN 13 09/01/2018   CREATININE 1.01 (H) 09/01/2018   BILITOT 0.6 09/01/2018   ALKPHOS 99 10/15/2016   AST 21 09/01/2018    ALT 16 09/01/2018   PROT 7.1 09/01/2018   ALBUMIN 4.4 10/15/2016   CALCIUM 9.5 09/01/2018   GFRAA 66 09/01/2018    Speciality Comments: No specialty comments available.  Procedures:  No procedures performed Allergies: Codeine, Penicillins, Sulfa antibiotics, Tramadol, Aromasin [exemestane], Atorvastatin, Celexa [citalopram hydrobromide], Citalopram, and Tamoxifen   Assessment / Plan:     Visit Diagnoses: Fibromyalgia-she has been experiencing a flare of fibromyalgia with generalized pain and discomfort.  She went to integrative therapies for several sessions and finished in December which was helpful.  She has been going to the gym 3 times a week for exercise.  Primary insomnia-she has been taking medications which has been helpful.  Other fatigue -related to insomnia.  She is on trazodone 150 mg at bedtime to help her sleep.   Tendinopathy of right shoulder-she is off-and-on discomfort in her right shoulder.  She had good range of motion without discomfort today.  Primary osteoarthritis of both hands-she has bilateral PIP and DIP thickening.  Joint protection was discussed.  Trochanteric bursitis of both hips-she continues to have discomfort in her bilateral trochanteric bursa.  She gives history of nocturnal pain.  She had tenderness on examination.  A handout on IT band stretches was given.  Elevated CK -she had elevated CK in the past.  We will repeat CK today.  Plan: CK, Aldolase.  We will contact her once the lab results are available.  Balance problems-she states she still does not have very good balance.  I offered neurology referral but she declined.  Other medical problems are listed as follows:  Occasional tremors  History of gastroesophageal reflux (GERD)  History of hypertension  History of breast cancer  Restless leg syndrome  Orders: Orders Placed This Encounter  Procedures   CK   Aldolase   No orders of the defined types were placed in this  encounter.    Follow-Up Instructions: Return in about 6 months (around 02/18/2022) for OA.   Bo Merino, MD  Note - This record has been created using Editor, commissioning.  Chart creation errors have been sought, but may not always  have been located. Such creation errors do not reflect on  the standard of medical care.

## 2021-08-21 ENCOUNTER — Encounter: Payer: Self-pay | Admitting: Rheumatology

## 2021-08-21 ENCOUNTER — Other Ambulatory Visit: Payer: Self-pay

## 2021-08-21 ENCOUNTER — Ambulatory Visit: Payer: Medicare HMO | Admitting: Rheumatology

## 2021-08-21 VITALS — BP 137/84 | HR 88 | Ht 72.0 in | Wt 233.8 lb

## 2021-08-21 DIAGNOSIS — M19042 Primary osteoarthritis, left hand: Secondary | ICD-10-CM

## 2021-08-21 DIAGNOSIS — M19041 Primary osteoarthritis, right hand: Secondary | ICD-10-CM

## 2021-08-21 DIAGNOSIS — Z8719 Personal history of other diseases of the digestive system: Secondary | ICD-10-CM

## 2021-08-21 DIAGNOSIS — Z853 Personal history of malignant neoplasm of breast: Secondary | ICD-10-CM

## 2021-08-21 DIAGNOSIS — M67911 Unspecified disorder of synovium and tendon, right shoulder: Secondary | ICD-10-CM

## 2021-08-21 DIAGNOSIS — R748 Abnormal levels of other serum enzymes: Secondary | ICD-10-CM

## 2021-08-21 DIAGNOSIS — M7062 Trochanteric bursitis, left hip: Secondary | ICD-10-CM

## 2021-08-21 DIAGNOSIS — M797 Fibromyalgia: Secondary | ICD-10-CM

## 2021-08-21 DIAGNOSIS — Z8679 Personal history of other diseases of the circulatory system: Secondary | ICD-10-CM

## 2021-08-21 DIAGNOSIS — R251 Tremor, unspecified: Secondary | ICD-10-CM

## 2021-08-21 DIAGNOSIS — R5383 Other fatigue: Secondary | ICD-10-CM | POA: Diagnosis not present

## 2021-08-21 DIAGNOSIS — R2689 Other abnormalities of gait and mobility: Secondary | ICD-10-CM

## 2021-08-21 DIAGNOSIS — F5101 Primary insomnia: Secondary | ICD-10-CM | POA: Diagnosis not present

## 2021-08-21 DIAGNOSIS — G2581 Restless legs syndrome: Secondary | ICD-10-CM

## 2021-08-21 DIAGNOSIS — M7061 Trochanteric bursitis, right hip: Secondary | ICD-10-CM

## 2021-08-21 NOTE — Patient Instructions (Signed)
Iliotibial Band Syndrome Rehab Ask your health care provider which exercises are safe for you. Do exercises exactly as told by your health care provider and adjust them as directed. It is normal to feel mild stretching, pulling, tightness, or discomfort as you do these exercises. Stop right away if you feel sudden pain or your pain gets significantly worse. Do not begin these exercises until told by your health care provider. Stretching and range-of-motion exercises These exercises warm up your muscles and joints and improve the movement andflexibility of your hip and pelvis. Quadriceps stretch, prone  Lie on your abdomen (prone position) on a firm surface, such as a bed or padded floor. Bend your left / right knee and reach back to hold your ankle or pant leg. If you cannot reach your ankle or pant leg, loop a belt around your foot and grab the belt instead. Gently pull your heel toward your buttocks. Your knee should not slide out to the side. You should feel a stretch in the front of your thigh and knee (quadriceps). Hold this position for __________ seconds. Repeat __________ times. Complete this exercise __________ times a day. Iliotibial band stretch An iliotibial band is a strong band of muscle tissue that runs from the outer side of your hip to the outer side of your thigh and knee. Lie on your side with your left / right leg in the top position. Bend both of your knees and grab your left / right ankle. Stretch out your bottom arm to help you balance. Slowly bring your top knee back so your thigh goes behind your trunk. Slowly lower your top leg toward the floor until you feel a gentle stretch on the outside of your left / right hip and thigh. If you do not feel a stretch and your knee will not fall farther, place the heel of your other foot on top of your knee and pull your knee down toward the floor with your foot. Hold this position for __________ seconds. Repeat __________ times.  Complete this exercise __________ times a day. Strengthening exercises These exercises build strength and endurance in your hip and pelvis. Enduranceis the ability to use your muscles for a long time, even after they get tired. Straight leg raises, side-lying This exercise strengthens the muscles that rotate the leg at the hip and move it away from your body (hip abductors). Lie on your side with your left / right leg in the top position. Lie so your head, shoulder, hip, and knee line up. You may bend your bottom knee to help you balance. Roll your hips slightly forward so your hips are stacked directly over each other and your left / right knee is facing forward. Tense the muscles in your outer thigh and lift your top leg 4-6 inches (10-15 cm). Hold this position for __________ seconds. Slowly lower your leg to return to the starting position. Let your muscles relax completely before doing another repetition. Repeat __________ times. Complete this exercise __________ times a day. Leg raises, prone This exercise strengthens the muscles that move the hips backward (hip extensors). Lie on your abdomen (prone position) on your bed or a firm surface. You can put a pillow under your hips if that is more comfortable for your lower back. Bend your left / right knee so your foot is straight up in the air. Squeeze your buttocks muscles and lift your left / right thigh off the bed. Do not let your back arch. Tense your thigh   muscle as hard as you can without increasing any knee pain. Hold this position for __________ seconds. Slowly lower your leg to return to the starting position and allow it to relax completely. Repeat __________ times. Complete this exercise __________ times a day. Hip hike Stand sideways on a bottom step. Stand on your left / right leg with your other foot unsupported next to the step. You can hold on to a railing or wall for balance if needed. Keep your knees straight and your  torso square. Then lift your left / right hip up toward the ceiling. Slowly let your left / right hip lower toward the floor, past the starting position. Your foot should get closer to the floor. Do not lean or bend your knees. Repeat __________ times. Complete this exercise __________ times a day. This information is not intended to replace advice given to you by your health care provider. Make sure you discuss any questions you have with your healthcare provider. Document Revised: 08/24/2019 Document Reviewed: 08/24/2019 Elsevier Patient Education  2022 [employer]  

## 2021-08-22 LAB — CK: Total CK: 292 U/L — ABNORMAL HIGH (ref 29–143)

## 2021-08-22 LAB — ALDOLASE: Aldolase: 4.6 U/L (ref <=8.1)

## 2021-08-22 NOTE — Progress Notes (Signed)
CK is a still mildly elevated and stable.

## 2021-08-23 NOTE — Progress Notes (Signed)
Aldolase is normal.

## 2021-09-26 NOTE — Progress Notes (Signed)
? ?Office Visit Note ? ?Patient: Whitney Ward             ?Date of Birth: 11/27/1950           ?MRN: [id number]             ?PCP: Rocky Morel, MD ?Referring: Huey Romans* ?Visit Date: 09/30/2021 ?Occupation: '@GUAROCC'$ @ ? ?Subjective:  ?Left trochanteric bursitis  ? ?History of Present Illness: Whitney Ward is a 71 y.o. female with history of fibromyalgia and osteoarthritis.  Patient presents today with increased pain on the left side of her hip.  She had a left trochanteric bursa cortisone injection performed on 05/22/2021 which provided significant relief but her discomfort has returned.  Patient reports that since retiring she started to go to the gym 2 to 3 days/week and has been able to sleep in longer in the morning.  She states that at the beginning of March 2023 she was diagnosed with an anterior MI.  She states that since then she has been more sedentary.  She has an upcoming appointment with her cardiologist in about 2 weeks.  She remains on her antihypertensive medications and is taking aspirin 81 mg daily.  She denies any other new medication changes at this time. ?She states that she has had increased pain and brain fog with fibromyalgia.  She remains on gabapentin, Klonopin, and trazodone as prescribed.  She does not need any refills at this time.  She plans on starting to have massages on a maintenance basis which has alleviated her symptoms in the past.  She also would like to return to [employer] to increase her activity level as tolerated. ? ?Activities of Daily Living:  ?Patient reports morning stiffness for 4 hours.   ?Patient Reports nocturnal pain.  ?Difficulty dressing/grooming: Reports ?Difficulty climbing stairs: Reports ?Difficulty getting out of chair: Reports ?Difficulty using hands for taps, buttons, cutlery, and/or writing: Reports ? ?Review of Systems  ?Constitutional:  Positive for fatigue.  ?HENT:  Negative for mouth dryness.   ?Eyes:  Negative  for dryness.  ?Respiratory:  Positive for shortness of breath.   ?Cardiovascular:  Negative for swelling in legs/feet.  ?Gastrointestinal:  Negative for constipation.  ?Endocrine: Negative for excessive thirst.  ?Genitourinary:  Negative for difficulty urinating.  ?Musculoskeletal:  Positive for joint pain, gait problem, joint pain, muscle weakness and morning stiffness.  ?Skin:  Negative for rash.  ?Allergic/Immunologic: Negative for susceptible to infections.  ?Neurological:  Positive for weakness.  ?Hematological:  Negative for bruising/bleeding tendency.  ?Psychiatric/Behavioral:  Negative for sleep disturbance.   ? ?PMFS History:  ?Patient Active Problem List  ? Diagnosis Date Noted  ? Fibromyalgia 10/09/2016  ? Primary insomnia 10/09/2016  ? Other fatigue 10/09/2016  ? Tendinopathy of right shoulder 10/09/2016  ? Trochanteric bursitis of right hip 10/09/2016  ? Primary osteoarthritis of both hands 10/09/2016  ? Chronic kidney disease 10/09/2016  ? Essential hypertension 10/09/2016  ? Gastroesophageal reflux disease 10/09/2016  ? Restless leg syndrome 10/09/2016  ? Malignant neoplasm of female breast ([place]) 10/09/2016  ? History of breast cancer 10/09/2016  ? Genital herpes simplex 10/09/2016  ?  ?Past Medical History:  ?Diagnosis Date  ? Arthritis   ? Breast cancer ([place])   ? Breast cancer ([place])   ? Broken toe   ? Fibromyalgia   ? Hypertension   ? MI (myocardial infarction) ([place])   ?  ?History reviewed. No pertinent family history. ?Past Surgical History:  ?Procedure Laterality Date  ?  ABDOMINAL HYSTERECTOMY    ? BREAST SURGERY    ? CESAREAN SECTION    ? ingrown toenail  03/21/2021  ? SMALL INTESTINE SURGERY    ? TONSILLECTOMY    ? ?Social History  ? ?Social History Narrative  ? Not on file  ? ?Immunization History  ?Administered Date(s) Administered  ? Moderna SARS-COV2 Booster Vaccination 05/14/2020  ? Moderna Sars-Covid-2 Vaccination 07/07/2019, 08/04/2019  ? PFIZER(Purple Top)SARS-COV-2 Vaccination  03/20/2021  ?  ? ?Objective: ?Vital Signs: BP 120/69 (BP Location: Right Arm, Patient Position: Sitting, Cuff Size: Normal)   Pulse 94   Resp 15   Ht 6' (1.829 m)   Wt 232 lb (105.2 kg)   BMI 31.46 kg/m?   ? ?Physical Exam ?Vitals and nursing note reviewed.  ?Constitutional:   ?   Appearance: She is well-developed.  ?HENT:  ?   Head: Normocephalic and atraumatic.  ?Eyes:  ?   Conjunctiva/sclera: Conjunctivae normal.  ?Cardiovascular:  ?   Rate and Rhythm: Normal rate and regular rhythm.  ?   Heart sounds: Normal heart sounds.  ?Pulmonary:  ?   Effort: Pulmonary effort is normal.  ?   Breath sounds: Normal breath sounds.  ?Abdominal:  ?   General: Bowel sounds are normal.  ?   Palpations: Abdomen is soft.  ?Musculoskeletal:  ?   Cervical back: Normal range of motion.  ?Skin: ?   General: Skin is warm and dry.  ?   Capillary Refill: Capillary refill takes less than 2 seconds.  ?Neurological:  ?   Mental Status: She is alert and oriented to person, place, and time.  ?Psychiatric:     ?   Behavior: Behavior normal.  ?  ? ?Musculoskeletal Exam: C-spine, thoracic spine, and lumbar spine good ROM.  No SI joint tenderness.  No SI joint tenderness. Shoulder joints, elbow joints, wrist joints, MCPs, PIPs, and DIPs good ROM with no synovitis.  Complete fist formation bilaterally.  Hip joints, knee joints, and ankle joints have good ROM with no discomfort.  No warmth or effusion of knee joints.  No tenderness or swelling of ankle joints.  Tenderness over the left trochanteric bursa.  ? ?CDAI Exam: ?CDAI Score: -- ?Patient Global: --; Provider Global: -- ?Swollen: --; Tender: -- ?Joint Exam 09/30/2021  ? ?No joint exam has been documented for this visit  ? ?There is currently no information documented on the homunculus. Go to the Rheumatology activity and complete the homunculus joint exam. ? ?Investigation: ?No additional findings. ? ?Imaging: ?No results found. ? ?Recent Labs: ?Lab Results  ?Component Value Date  ? WBC  4.8 09/01/2018  ? HGB 12.5 09/01/2018  ? PLT 258 09/01/2018  ? NA 143 09/01/2018  ? K 3.7 09/01/2018  ? CL 105 09/01/2018  ? CO2 30 09/01/2018  ? GLUCOSE 95 09/01/2018  ? BUN 13 09/01/2018  ? CREATININE 1.01 (H) 09/01/2018  ? BILITOT 0.6 09/01/2018  ? ALKPHOS 99 10/15/2016  ? AST 21 09/01/2018  ? ALT 16 09/01/2018  ? PROT 7.1 09/01/2018  ? ALBUMIN 4.4 10/15/2016  ? CALCIUM 9.5 09/01/2018  ? GFRAA 66 09/01/2018  ? ? ?Speciality Comments: No specialty comments available. ? ?Procedures:  ?Large Joint Inj: L greater trochanter on 09/30/2021 8:53 AM ?Indications: pain ?Details: 27 G 1.5 in needle, lateral approach ? ?Arthrogram: No ? ?Medications: 1.5 mL lidocaine 1 %; 40 mg triamcinolone acetonide 40 MG/ML ?Aspirate: 0 mL ?Outcome: tolerated well, no immediate complications ?Procedure, treatment alternatives, risks and benefits explained, specific  risks discussed. Consent was given by the patient. Immediately prior to procedure a time out was called to verify the correct patient, procedure, equipment, support staff and site/side marked as required. Patient was prepped and draped in the usual sterile fashion.  ? ? ?Allergies: Codeine, Penicillins, Sulfa antibiotics, Tramadol, Aromasin [exemestane], Atorvastatin, Celexa [citalopram hydrobromide], Citalopram, and Tamoxifen  ? ?Assessment / Plan:     ?Visit Diagnoses: Fibromyalgia: She has been experiencing intermittent myalgias and muscle tenderness due to fibromyalgia. She has also had some increased brain fog and fatigue lately.  Of note the patient reports being diagnosed with an anterior MI at the beginning of March 2023.  She has been more sedentary since then which she attributes to some increased pain and brain fog.  She presents today with left trochanteric bursitis.  She requested a left trochanter bursa cortisone injection.  She tolerated the procedure well.  Procedure note was completed above.  Aftercare was discussed.  She plans on starting back at [employer]  to increase her exercise regimen.  [facility].  She also plans on going back to massage and before maintenance massages which have alleviated her sympto

## 2021-09-30 ENCOUNTER — Ambulatory Visit (INDEPENDENT_AMBULATORY_CARE_PROVIDER_SITE_OTHER): Payer: Medicare HMO | Admitting: Physician Assistant

## 2021-09-30 ENCOUNTER — Encounter: Payer: Self-pay | Admitting: Physician Assistant

## 2021-09-30 VITALS — BP 120/69 | HR 94 | Resp 15 | Ht 72.0 in | Wt 232.0 lb

## 2021-09-30 DIAGNOSIS — R2689 Other abnormalities of gait and mobility: Secondary | ICD-10-CM

## 2021-09-30 DIAGNOSIS — M7062 Trochanteric bursitis, left hip: Secondary | ICD-10-CM

## 2021-09-30 DIAGNOSIS — M797 Fibromyalgia: Secondary | ICD-10-CM

## 2021-09-30 DIAGNOSIS — R748 Abnormal levels of other serum enzymes: Secondary | ICD-10-CM

## 2021-09-30 DIAGNOSIS — R251 Tremor, unspecified: Secondary | ICD-10-CM

## 2021-09-30 DIAGNOSIS — Z853 Personal history of malignant neoplasm of breast: Secondary | ICD-10-CM

## 2021-09-30 DIAGNOSIS — M19042 Primary osteoarthritis, left hand: Secondary | ICD-10-CM

## 2021-09-30 DIAGNOSIS — F5101 Primary insomnia: Secondary | ICD-10-CM

## 2021-09-30 DIAGNOSIS — G2581 Restless legs syndrome: Secondary | ICD-10-CM

## 2021-09-30 DIAGNOSIS — M19041 Primary osteoarthritis, right hand: Secondary | ICD-10-CM

## 2021-09-30 DIAGNOSIS — I252 Old myocardial infarction: Secondary | ICD-10-CM

## 2021-09-30 DIAGNOSIS — R5383 Other fatigue: Secondary | ICD-10-CM | POA: Diagnosis not present

## 2021-09-30 DIAGNOSIS — M67911 Unspecified disorder of synovium and tendon, right shoulder: Secondary | ICD-10-CM

## 2021-09-30 DIAGNOSIS — Z8679 Personal history of other diseases of the circulatory system: Secondary | ICD-10-CM

## 2021-09-30 DIAGNOSIS — M7061 Trochanteric bursitis, right hip: Secondary | ICD-10-CM

## 2021-09-30 DIAGNOSIS — Z8719 Personal history of other diseases of the digestive system: Secondary | ICD-10-CM

## 2021-09-30 MED ORDER — LIDOCAINE HCL 1 % IJ SOLN
1.5000 mL | INTRAMUSCULAR | Status: AC | PRN
Start: 2021-09-30 — End: 2021-09-30
  Administered 2021-09-30: 1.5 mL

## 2021-09-30 MED ORDER — TRIAMCINOLONE ACETONIDE 40 MG/ML IJ SUSP
40.0000 mg | INTRAMUSCULAR | Status: AC | PRN
Start: 2021-09-30 — End: 2021-09-30
  Administered 2021-09-30: 40 mg via INTRA_ARTICULAR

## 2021-09-30 NOTE — Patient Instructions (Signed)

## 2022-02-04 NOTE — Progress Notes (Unsigned)
Office Visit Note  Patient: Whitney Ward             Date of Birth: February 10, 1951           MRN: [id number]             PCP: Rocky Morel, MD Referring: Huey Romans* Visit Date: 02/18/2022 Occupation: '@GUAROCC'$ @  Subjective:  Left knee pain   History of Present Illness: Whitney Ward is a 71 y.o. female with history of fibromyalgia and osteoarthritis.  Patient presents today with pain and swelling in the left knee joint.  She states that the aching sensation started about 3 weeks ago with no injury prior to the onset of symptoms.  She has been having difficulty walking as well as pain at night in the left knee.  She has noticed swelling and has difficulty fully extending her left knee currently.  She states she also is having increased discomfort due to trochanter bursitis of the left hip.  She had a left trochanteric bursa cortisone injection on 09/30/2021 which provided temporary relief.  Overall since her last office visit she feels that she has been having increased arthralgias and joint stiffness.  She has intermittent myalgias and muscle tenderness due to fibromyalgia.  She continues to have chronic fatigue secondary to insomnia.  She takes Klonopin 1 mg at bedtime for insomnia and anxiety.      Activities of Daily Living:  Patient reports morning stiffness for 2 hours.   Patient Reports nocturnal pain.  Difficulty dressing/grooming: Denies Difficulty climbing stairs: Denies Difficulty getting out of chair: Reports Difficulty using hands for taps, buttons, cutlery, and/or writing: Denies  Review of Systems  Constitutional:  Positive for fatigue.  HENT:  Negative for mouth sores and mouth dryness.   Eyes:  Positive for dryness.  Respiratory:  Positive for shortness of breath.   Cardiovascular:  Positive for palpitations. Negative for chest pain.  Gastrointestinal:  Positive for constipation. Negative for blood in stool and diarrhea.  Endocrine:  Negative for increased urination.  Genitourinary:  Negative for involuntary urination.  Musculoskeletal:  Positive for joint pain, gait problem, joint pain, joint swelling, myalgias, muscle weakness, morning stiffness, muscle tenderness and myalgias.  Skin:  Positive for hair loss. Negative for color change, rash and sensitivity to sunlight.  Allergic/Immunologic: Negative for susceptible to infections.  Neurological:  Negative for dizziness and headaches.  Hematological:  Positive for swollen glands.  Psychiatric/Behavioral:  Negative for depressed mood and sleep disturbance. The patient is not nervous/anxious.     PMFS History:  Patient Active Problem List   Diagnosis Date Noted   Fibromyalgia 10/09/2016   Primary insomnia 10/09/2016   Other fatigue 10/09/2016   Tendinopathy of right shoulder 10/09/2016   Trochanteric bursitis of right hip 10/09/2016   Primary osteoarthritis of both hands 10/09/2016   Chronic kidney disease 10/09/2016   Essential hypertension 10/09/2016   Gastroesophageal reflux disease 10/09/2016   Restless leg syndrome 10/09/2016   Malignant neoplasm of female breast ([place]) 10/09/2016   History of breast cancer 10/09/2016   Genital herpes simplex 10/09/2016    Past Medical History:  Diagnosis Date   Arthritis    Breast cancer ([place])    Breast cancer ([place])    Broken toe    CAD (coronary artery disease)    Fibromyalgia    Hypertension    MI (myocardial infarction) ([place])     Family History  Problem Relation Age of Onset   Lupus Mother  Past Surgical History:  Procedure Laterality Date   ABDOMINAL HYSTERECTOMY     BREAST SURGERY     CESAREAN SECTION     ingrown toenail  03/21/2021   SMALL INTESTINE SURGERY     TONSILLECTOMY     Social History   Social History Narrative   Not on file   Immunization History  Administered Date(s) Administered   Moderna SARS-COV2 Booster Vaccination 05/14/2020   Moderna Sars-Covid-2 Vaccination 07/07/2019,  08/04/2019   PFIZER(Purple Top)SARS-COV-2 Vaccination 03/20/2021     Objective: Vital Signs: BP (!) 149/74 (BP Location: Left Arm, Patient Position: Sitting, Cuff Size: Normal)   Pulse 86   Resp 15   Ht 5' 10.75" (1.797 m)   Wt 235 lb (106.6 kg)   BMI 33.01 kg/m    Physical Exam Vitals and nursing note reviewed.  Constitutional:      Appearance: She is well-developed.  HENT:     Head: Normocephalic and atraumatic.  Eyes:     Conjunctiva/sclera: Conjunctivae normal.  Cardiovascular:     Rate and Rhythm: Normal rate and regular rhythm.     Heart sounds: Normal heart sounds.  Pulmonary:     Effort: Pulmonary effort is normal.     Breath sounds: Normal breath sounds.  Abdominal:     General: Bowel sounds are normal.     Palpations: Abdomen is soft.  Musculoskeletal:     Cervical back: Normal range of motion.  Skin:    General: Skin is warm and dry.     Capillary Refill: Capillary refill takes less than 2 seconds.  Neurological:     Mental Status: She is alert and oriented to person, place, and time.  Psychiatric:        Behavior: Behavior normal.      Musculoskeletal Exam: C-spine, thoracic spine, lumbar spine have good range of motion.  Some tenderness over the left SI joint.  No midline spinal tenderness.  Shoulder joints, elbow joints, wrist joints, MCPs, PIPs, DIPs have good range of motion with no synovitis.  She has some tenderness over the right wrist joint.  Hip joints have good range of motion with some groin pain in the left hip.  Tenderness over the left trochanteric bursa.  Warmth and swelling of the left knee with painful range of motion.  Right knee has good range of motion with no warmth or effusion.  Ankle joints have good range of motion with no tenderness or joint swelling.  CDAI Exam: CDAI Score: -- Patient Global: --; Provider Global: -- Swollen: 1 ; Tender: 3  Joint Exam 02/18/2022      Right  Left  Glenohumeral   Tender     Wrist   Tender      Knee     Swollen Tender     Investigation: No additional findings.  Imaging: XR KNEE 3 VIEW LEFT  Result Date: 02/18/2022 Moderate medial compartment narrowing was noted. Calcification was noted in the medial aspect of the knee which is unchanged when compared to the x-rays of 2020.  Moderate patellofemoral narrowing was noted.  No radiographic progression was noted when compared to the x-rays of 2020. Impression: These findings are consistent with moderate osteoarthritis and moderate chondromalacia patella.   Recent Labs: Lab Results  Component Value Date   WBC 4.8 09/01/2018   HGB 12.5 09/01/2018   PLT 258 09/01/2018   NA 143 09/01/2018   K 3.7 09/01/2018   CL 105 09/01/2018   CO2 30 09/01/2018   GLUCOSE  95 09/01/2018   BUN 13 09/01/2018   CREATININE 1.01 (H) 09/01/2018   BILITOT 0.6 09/01/2018   ALKPHOS 99 10/15/2016   AST 21 09/01/2018   ALT 16 09/01/2018   PROT 7.1 09/01/2018   ALBUMIN 4.4 10/15/2016   CALCIUM 9.5 09/01/2018   GFRAA 66 09/01/2018    Speciality Comments: No specialty comments available.  Procedures:  Large Joint Inj: L knee on 02/18/2022 1:28 PM Indications: pain Details: 27 G 1.5 in needle, medial approach  Arthrogram: No  Medications: 1.5 mL lidocaine 1 %; 40 mg triamcinolone acetonide 40 MG/ML Aspirate: 0 mL Outcome: tolerated well, no immediate complications Procedure, treatment alternatives, risks and benefits explained, specific risks discussed. Consent was given by the patient. Immediately prior to procedure a time out was called to verify the correct patient, procedure, equipment, support staff and site/side marked as required. Patient was prepped and draped in the usual sterile fashion.     Allergies: Codeine, Penicillins, Sulfa antibiotics, Tramadol, Aromasin [exemestane], Atorvastatin, Celexa [citalopram hydrobromide], Citalopram, and Tamoxifen   Assessment / Plan:     Visit Diagnoses: Fibromyalgia: She has intermittent myalgias  and muscle tenderness due to fibromyalgia.  She has ongoing trapezius muscle tension and tenderness bilaterally.  She presents today with trochanter bursitis of the left hip.  Encouraged the patient to perform stretching exercises daily.  She has been taking Tylenol ibuprofen combination for pain relief.  She remains on gabapentin as prescribed.  Discussed importance of regular exercise and good sleep hygiene.  Primary insomnia: She takes Klonopin 1 mg at bedtime and trazodone 150 mg at bedtime for insomnia and anxiety.  Other fatigue: Chronic fatigue secondary to insomnia.  Discussed the importance of regular exercise and good sleep hygiene.  Tendinopathy of right shoulder: She continues to experience intermittent pain and stiffness in her right shoulder joint.  She has good range of motion on examination today with some tenderness upon palpation.  Primary osteoarthritis of both hands: She has PIP and DIP thickening consistent with osteoarthritis of both hands.  She was able to make a complete fist bilaterally.  She has been experiencing increased pain and stiffness in both hands recently.  She had no synovitis on examination today.  Trochanteric bursitis of both hips -She presents today with increased discomfort in the left hip due to trochanter bursitis.  On examination she has some discomfort in the groin as well as tenderness palpation over the left trochanteric bursa.  She had a left trochanteric bursa cortisone injection on 09/30/21 which provided temporary relief.  Encouraged the patient to perform stretching exercises daily.  If her symptoms persist or worsen she can return for cortisone injection.  Acute pain of left knee - She presents today with pain and swelling in the left knee joint.  Her symptoms started 3 weeks ago with no injury or fall prior to the onset of symptoms.  She has been experiencing difficulty walking as well as pain at night in the left knee.  She has been taking Tylenol and  ibuprofen combination for pain relief.  On examination she has warmth and swelling of the left knee with painful range of motion. X-rays of the left knee were obtained today for further evaluation.  Aspiration attempted but no fluid was drawn off. The left knee joint was injected with cortisone today after informed consent.  The procedure note was completed above.  Aftercare was discussed.  The following lab work will be obtained today for further evaluation.  She was advised to  notify us if her symptoms persist or worsen.  Plan: XR KNEE 3 VIEW LEFT, 14-3-3 eta Protein, Cyclic citrul peptide antibody, IgG, Rheumatoid factor, Uric acid, Sedimentation rate, C-reactive protein  Swelling of left knee joint - She presents today with pain and swelling in the left knee joint.  X-rays of the left knee were obtained today.  The following lab work will be obtained for further evaluation.  After informed consent the left knee joint was injected with cortisone. Aspiration attempted but no fluid was aspirated. Aftercare was discussed.  Procedure note was completed above.  She was advised to notify us if her symptoms persist or worsen.  Plan: 14-3-3 eta Protein, Cyclic citrul peptide antibody, IgG, Rheumatoid factor, Uric acid, Sedimentation rate, C-reactive protein  Other medical conditions are listed as follows:   Elevated CK  Balance problems  Occasional tremors  History of gastroesophageal reflux (GERD)  History of hypertension  History of breast cancer  Restless leg syndrome  History of MI (myocardial infarction)    Orders: Orders Placed This Encounter  Procedures   Large Joint Inj: L knee   XR KNEE 3 VIEW LEFT   14-3-3 eta Protein   Cyclic citrul peptide antibody, IgG   Rheumatoid factor   Uric acid   Sedimentation rate   C-reactive protein   No orders of the defined types were placed in this encounter.     Follow-Up Instructions: Return in about 6 months (around 08/21/2022) for  Fibromyalgia, Osteoarthritis.   Ofilia Neas, PA-C  Note - This record has been created using Dragon software.  Chart creation errors have been sought, but may not always  have been located. Such creation errors do not reflect on  the standard of medical care.

## 2022-02-07 ENCOUNTER — Telehealth (HOSPITAL_COMMUNITY): Payer: Self-pay | Admitting: Emergency Medicine

## 2022-02-07 NOTE — Telephone Encounter (Signed)
error 

## 2022-02-18 ENCOUNTER — Ambulatory Visit: Payer: Medicare HMO | Attending: Physician Assistant | Admitting: Physician Assistant

## 2022-02-18 ENCOUNTER — Encounter: Payer: Self-pay | Admitting: Physician Assistant

## 2022-02-18 ENCOUNTER — Ambulatory Visit (INDEPENDENT_AMBULATORY_CARE_PROVIDER_SITE_OTHER): Payer: Medicare HMO

## 2022-02-18 VITALS — BP 149/74 | HR 86 | Resp 15 | Ht 70.75 in | Wt 235.0 lb

## 2022-02-18 DIAGNOSIS — M67911 Unspecified disorder of synovium and tendon, right shoulder: Secondary | ICD-10-CM

## 2022-02-18 DIAGNOSIS — M7061 Trochanteric bursitis, right hip: Secondary | ICD-10-CM

## 2022-02-18 DIAGNOSIS — G2581 Restless legs syndrome: Secondary | ICD-10-CM

## 2022-02-18 DIAGNOSIS — M25562 Pain in left knee: Secondary | ICD-10-CM

## 2022-02-18 DIAGNOSIS — Z8719 Personal history of other diseases of the digestive system: Secondary | ICD-10-CM

## 2022-02-18 DIAGNOSIS — M7062 Trochanteric bursitis, left hip: Secondary | ICD-10-CM

## 2022-02-18 DIAGNOSIS — R2689 Other abnormalities of gait and mobility: Secondary | ICD-10-CM

## 2022-02-18 DIAGNOSIS — R5383 Other fatigue: Secondary | ICD-10-CM

## 2022-02-18 DIAGNOSIS — M25462 Effusion, left knee: Secondary | ICD-10-CM

## 2022-02-18 DIAGNOSIS — R251 Tremor, unspecified: Secondary | ICD-10-CM

## 2022-02-18 DIAGNOSIS — M797 Fibromyalgia: Secondary | ICD-10-CM | POA: Diagnosis not present

## 2022-02-18 DIAGNOSIS — Z853 Personal history of malignant neoplasm of breast: Secondary | ICD-10-CM

## 2022-02-18 DIAGNOSIS — M19042 Primary osteoarthritis, left hand: Secondary | ICD-10-CM

## 2022-02-18 DIAGNOSIS — R748 Abnormal levels of other serum enzymes: Secondary | ICD-10-CM

## 2022-02-18 DIAGNOSIS — Z8679 Personal history of other diseases of the circulatory system: Secondary | ICD-10-CM

## 2022-02-18 DIAGNOSIS — F5101 Primary insomnia: Secondary | ICD-10-CM

## 2022-02-18 DIAGNOSIS — I252 Old myocardial infarction: Secondary | ICD-10-CM

## 2022-02-18 DIAGNOSIS — M19041 Primary osteoarthritis, right hand: Secondary | ICD-10-CM

## 2022-02-18 MED ORDER — LIDOCAINE HCL 1 % IJ SOLN
1.5000 mL | INTRAMUSCULAR | Status: AC | PRN
  Administered 2022-02-18: 1.5 mL

## 2022-02-18 MED ORDER — TRIAMCINOLONE ACETONIDE 40 MG/ML IJ SUSP
40.0000 mg | INTRAMUSCULAR | Status: AC | PRN
  Administered 2022-02-18: 40 mg via INTRA_ARTICULAR

## 2022-02-19 NOTE — Progress Notes (Signed)
ESR and uric acid WNL.   RF negative. CRP is elevated-10.7.

## 2022-02-20 NOTE — Progress Notes (Signed)
Anti-CCP negative.

## 2022-02-25 LAB — C-REACTIVE PROTEIN: CRP: 10.7 mg/L — ABNORMAL HIGH (ref <8.0)

## 2022-02-25 LAB — RHEUMATOID FACTOR: Rheumatoid fact SerPl-aCnc: <14 IU/mL (ref <14)

## 2022-02-25 LAB — CYCLIC CITRUL PEPTIDE ANTIBODY, IGG: Cyclic Citrullin Peptide Ab: <16 UNITS

## 2022-02-25 LAB — 14-3-3 ETA PROTEIN: 14-3-3 eta Protein: <0.2 ng/mL (ref <0.2)

## 2022-02-25 LAB — SEDIMENTATION RATE: Sed Rate: 28 mm/h (ref 0–30)

## 2022-02-25 LAB — URIC ACID: Uric Acid, Serum: 6.4 mg/dL (ref 2.5–7.0)

## 2022-02-26 NOTE — Progress Notes (Signed)
14-3-3 eta negative.   Please clarify how the patients knee is doing after the cortisone injection.

## 2022-03-16 IMAGING — CT CT MAXILLOFACIAL W/O CM
3 series · 16 of 47 positions shown, 19 images · non-contrast
Comparison: Head MRI 04/27/2020. Head and maxillofacial CTs
05/05/2017.

CLINICAL DATA: Head trauma, intracranial arterial injury suspected;
Facial trauma. Upper lip and nose pain.

EXAM:
CT HEAD WITHOUT CONTRAST
CT MAXILLOFACIAL WITHOUT CONTRAST
TECHNIQUE: Multidetector CT imaging of the head and maxillofacial structures
were performed using the standard protocol without intravenous
contrast. Multiplanar CT image reconstructions of the maxillofacial
structures were also generated.

[Series 4: max soft · axial · 0.39mm/px · z∈[+858,+1002]mm · 10 of 84 slices shown, 13 images]
[im 6/84  brain]
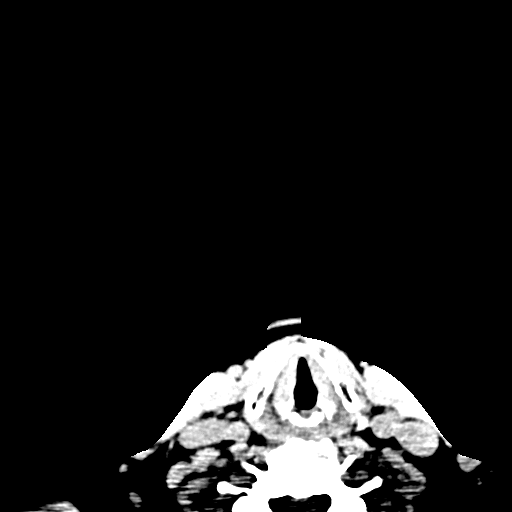
[im 6/84  bone]
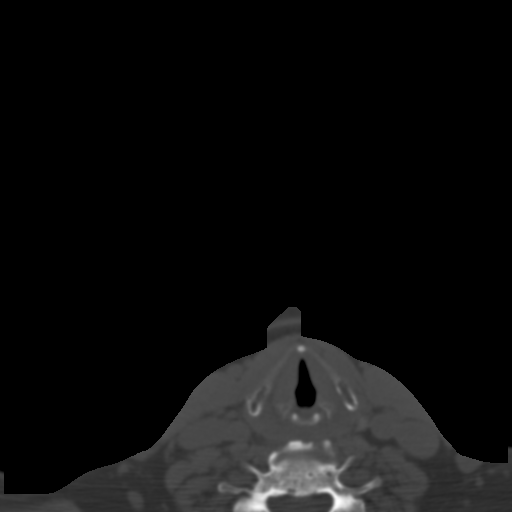
[im 15/84  bone]
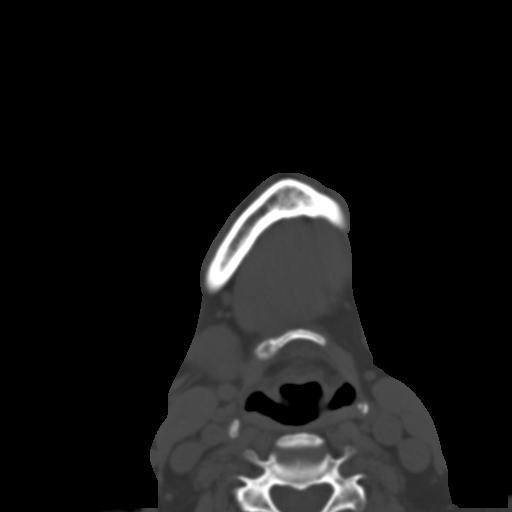
[im 23/84  bone]
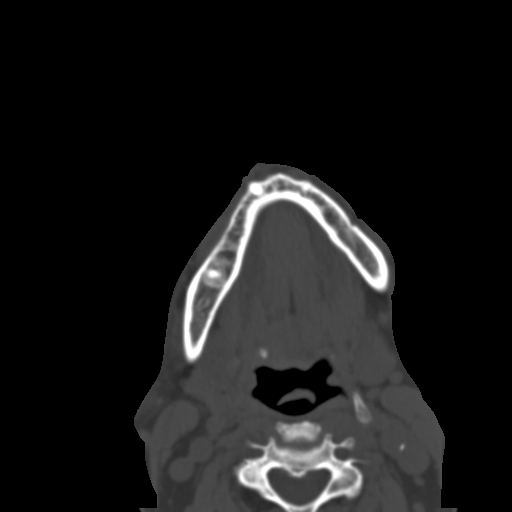
[im 29/84  bone]
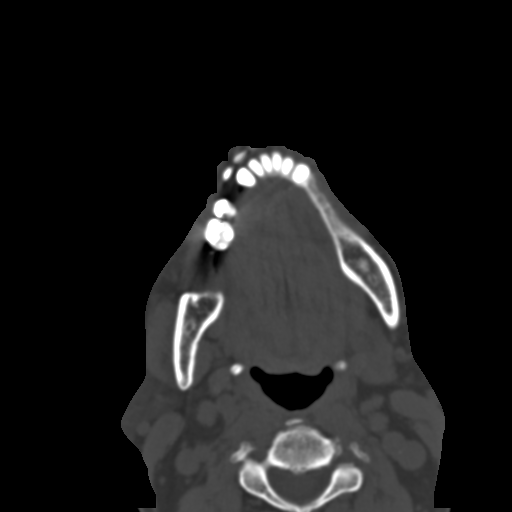
[im 38/84  brain]
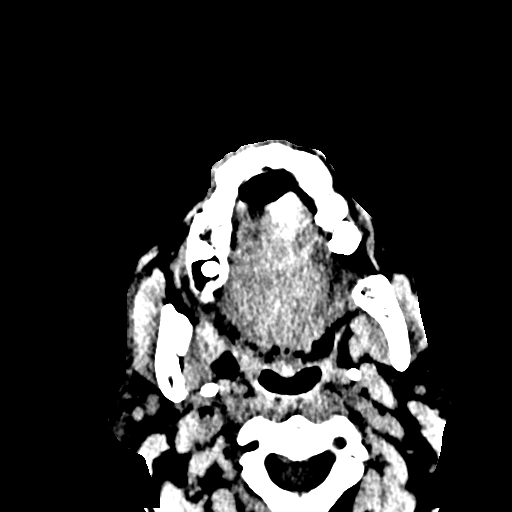
[im 38/84  bone]
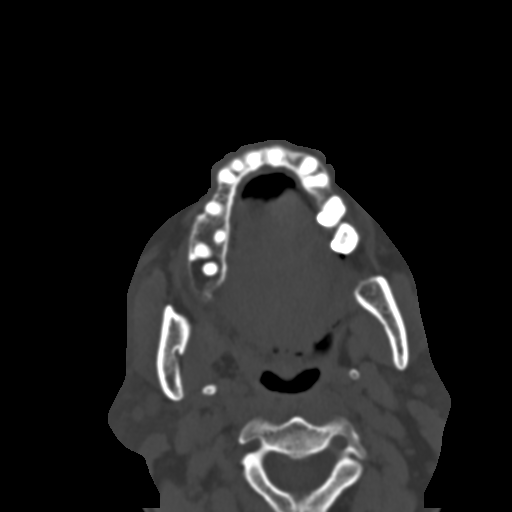
[im 46/84  bone]
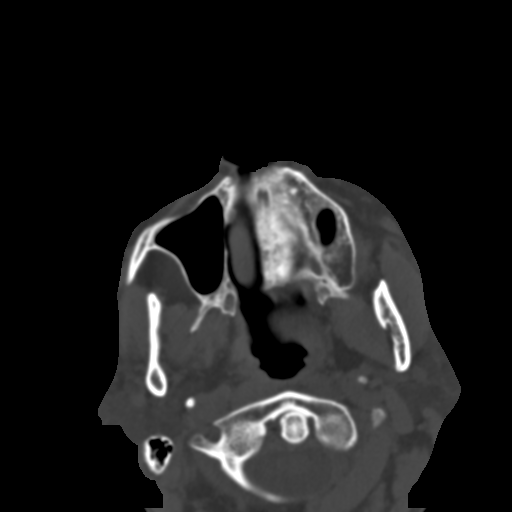
[im 55/84  bone]
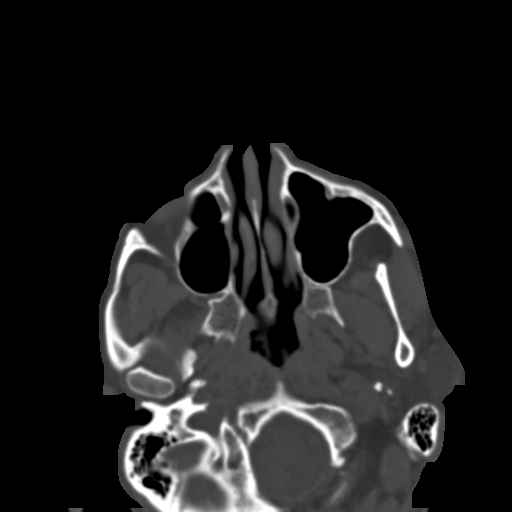
[im 63/84  bone]
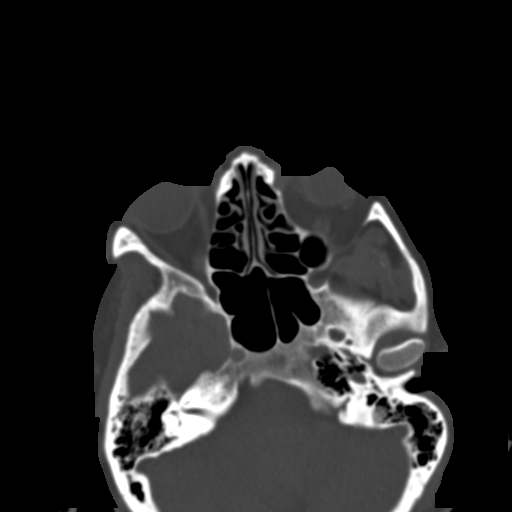
[im 69/84  brain]
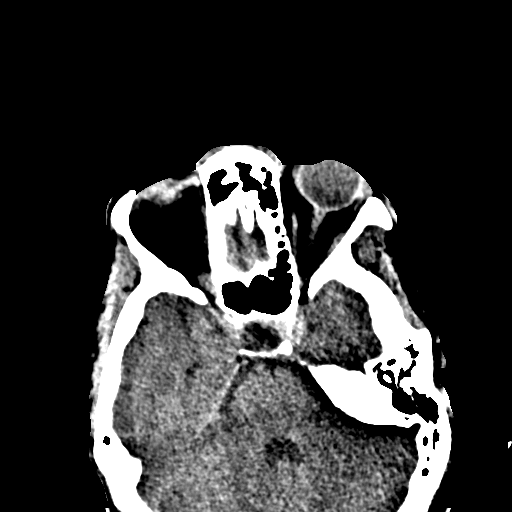
[im 69/84  bone]
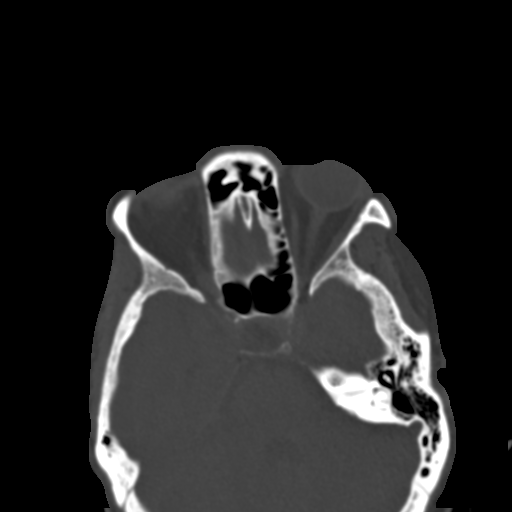
[im 78/84  bone]
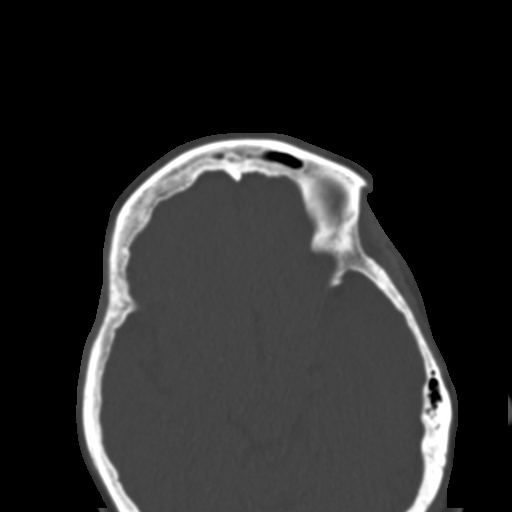

[Series 9: coronal soft · coronal · 0.38mm/px · 3 of 71 slices shown]
[im 24/71  bone]
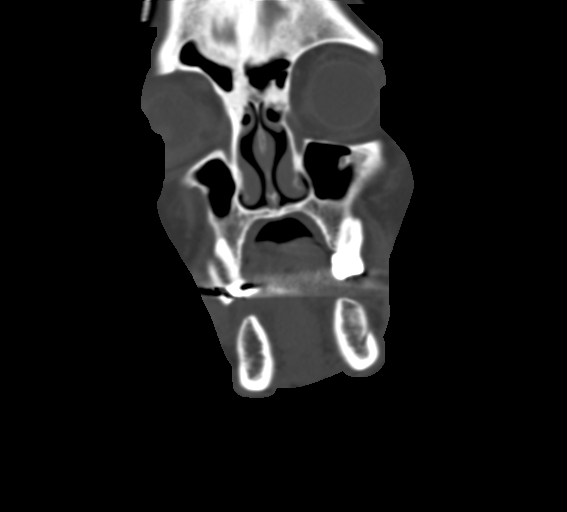
[im 32/71  bone]
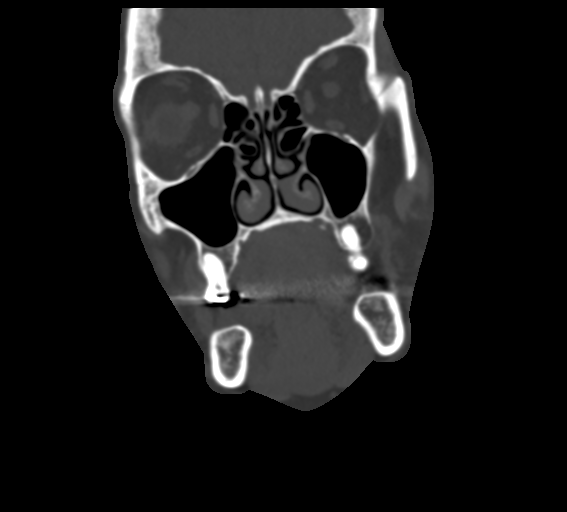
[im 39/71  bone]
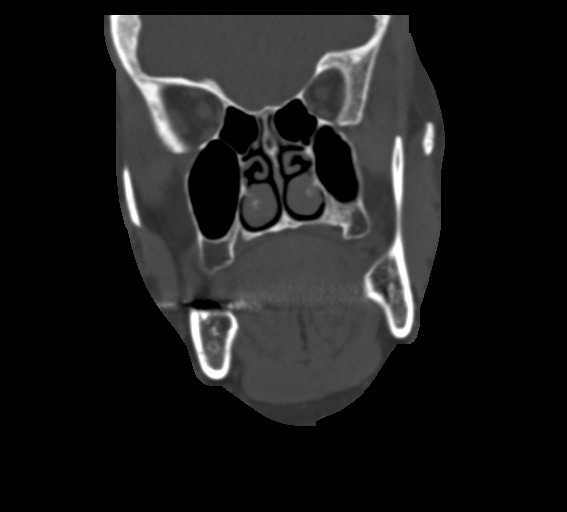

[Series 10: sagittal soft · sagittal · 0.35mm/px · 3 of 73 slices shown]
[im 25/73  bone]
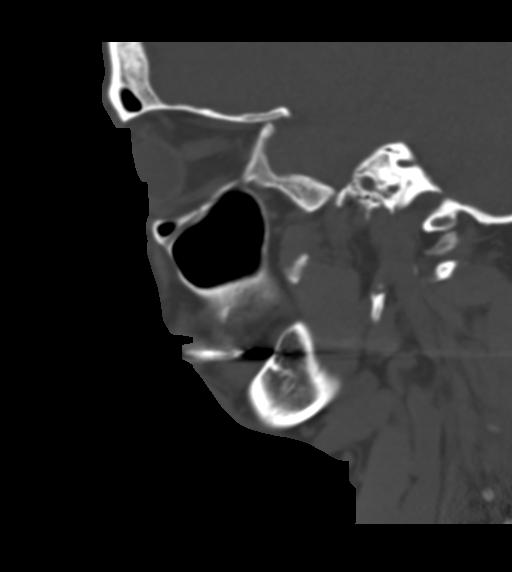
[im 37/73  bone]
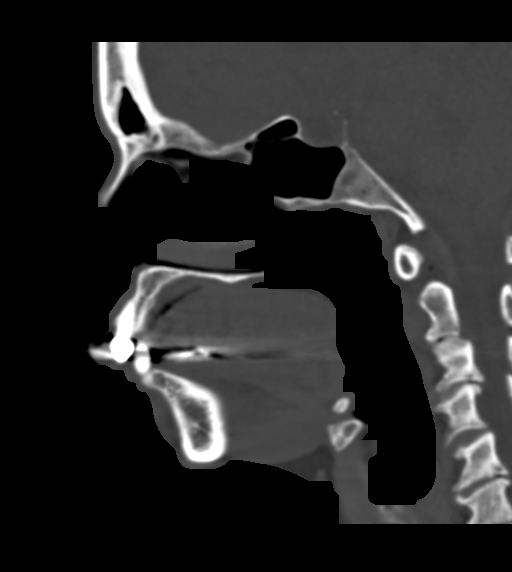
[im 49/73  bone]
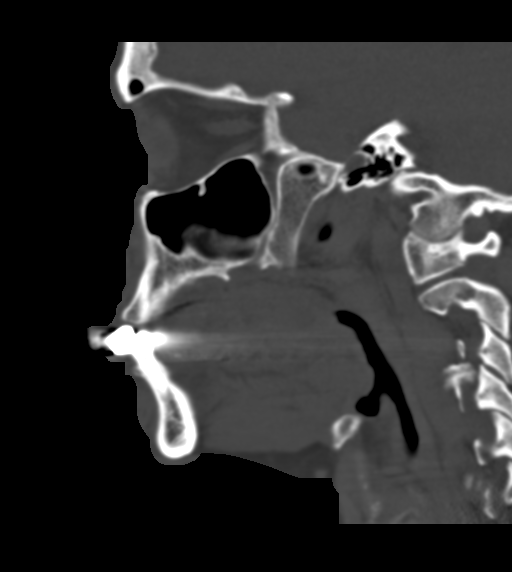

[16 of 47 positions shown; findings below may reference images not displayed]

FINDINGS: CT HEAD FINDINGS

Brain: There is no evidence of an acute infarct, intracranial
hemorrhage, mass, midline shift, or extra-axial fluid collection.
The ventricles and sulci are normal.

Vascular: Calcified atherosclerosis at the skull base. No hyperdense
vessel.

Skull: No fracture or suspicious osseous lesion.

Other: None.

CT MAXILLOFACIAL FINDINGS

Osseous: No acute fracture, mandibular dislocation, or destructive
osseous process.

Orbits: Unremarkable.

Sinuses: Paranasal sinuses and mastoid air cells are clear.

Soft tissues: Mild calcific atherosclerosis in the proximal left
ICA.
IMPRESSION: No evidence of acute intracranial abnormality or maxillofacial
fracture.

## 2022-03-20 NOTE — Progress Notes (Deleted)
Office Visit Note  Patient: Whitney Ward             Date of Birth: 10/03/1950           MRN: [id number]             PCP: Rocky Morel, MD Referring: Huey Romans* Visit Date: 04/03/2022 Occupation: '@GUAROCC'$ @  Subjective:  No chief complaint on file.   History of Present Illness: Whitney Ward is a 71 y.o. female ***   Activities of Daily Living:  Patient reports morning stiffness for *** {minute/hour:19697}.   Patient {ACTIONS;DENIES/REPORTS:21021675::"Denies"} nocturnal pain.  Difficulty dressing/grooming: {ACTIONS;DENIES/REPORTS:21021675::"Denies"} Difficulty climbing stairs: {ACTIONS;DENIES/REPORTS:21021675::"Denies"} Difficulty getting out of chair: {ACTIONS;DENIES/REPORTS:21021675::"Denies"} Difficulty using hands for taps, buttons, cutlery, and/or writing: {ACTIONS;DENIES/REPORTS:21021675::"Denies"}  No Rheumatology ROS completed.   PMFS History:  Patient Active Problem List   Diagnosis Date Noted   Fibromyalgia 10/09/2016   Primary insomnia 10/09/2016   Other fatigue 10/09/2016   Tendinopathy of right shoulder 10/09/2016   Trochanteric bursitis of right hip 10/09/2016   Primary osteoarthritis of both hands 10/09/2016   Chronic kidney disease 10/09/2016   Essential hypertension 10/09/2016   Gastroesophageal reflux disease 10/09/2016   Restless leg syndrome 10/09/2016   Malignant neoplasm of female breast ([place]) 10/09/2016   History of breast cancer 10/09/2016   Genital herpes simplex 10/09/2016    Past Medical History:  Diagnosis Date   Arthritis    Breast cancer ([place])    Breast cancer ([place])    Broken toe    CAD (coronary artery disease)    Fibromyalgia    Hypertension    MI (myocardial infarction) ([place])     Family History  Problem Relation Age of Onset   Lupus Mother    Past Surgical History:  Procedure Laterality Date   ABDOMINAL HYSTERECTOMY     BREAST SURGERY     CESAREAN SECTION     ingrown toenail   03/21/2021   SMALL INTESTINE SURGERY     TONSILLECTOMY     Social History   Social History Narrative   Not on file   Immunization History  Administered Date(s) Administered   Moderna SARS-COV2 Booster Vaccination 05/14/2020   Moderna Sars-Covid-2 Vaccination 07/07/2019, 08/04/2019   PFIZER(Purple Top)SARS-COV-2 Vaccination 03/20/2021     Objective: Vital Signs: There were no vitals taken for this visit.   Physical Exam   Musculoskeletal Exam: ***  CDAI Exam: CDAI Score: -- Patient Global: --; Provider Global: -- Swollen: --; Tender: -- Joint Exam 04/03/2022   No joint exam has been documented for this visit   There is currently no information documented on the homunculus. Go to the Rheumatology activity and complete the homunculus joint exam.  Investigation: No additional findings.  Imaging: No results found.  Recent Labs: Lab Results  Component Value Date   WBC 4.8 09/01/2018   HGB 12.5 09/01/2018   PLT 258 09/01/2018   NA 143 09/01/2018   K 3.7 09/01/2018   CL 105 09/01/2018   CO2 30 09/01/2018   GLUCOSE 95 09/01/2018   BUN 13 09/01/2018   CREATININE 1.01 (H) 09/01/2018   BILITOT 0.6 09/01/2018   ALKPHOS 99 10/15/2016   AST 21 09/01/2018   ALT 16 09/01/2018   PROT 7.1 09/01/2018   ALBUMIN 4.4 10/15/2016   CALCIUM 9.5 09/01/2018   GFRAA 66 09/01/2018    Speciality Comments: No specialty comments available.  Procedures:  No procedures performed Allergies: Codeine, Penicillins, Sulfa antibiotics, Tramadol, Aromasin [exemestane], Atorvastatin, Celexa [citalopram hydrobromide], Citalopram,  and Tamoxifen   Assessment / Plan:     Visit Diagnoses: No diagnosis found.  Orders: No orders of the defined types were placed in this encounter.  No orders of the defined types were placed in this encounter.   Face-to-face time spent with patient was *** minutes. Greater than 50% of time was spent in counseling and coordination of care.  Follow-Up  Instructions: No follow-ups on file.   Earnestine Mealing, CMA  Note - This record has been created using Editor, commissioning.  Chart creation errors have been sought, but may not always  have been located. Such creation errors do not reflect on  the standard of medical care.

## 2022-04-03 ENCOUNTER — Ambulatory Visit: Payer: Medicare HMO | Admitting: Rheumatology

## 2022-04-03 DIAGNOSIS — M25562 Pain in left knee: Secondary | ICD-10-CM

## 2022-04-03 DIAGNOSIS — M7061 Trochanteric bursitis, right hip: Secondary | ICD-10-CM

## 2022-04-03 DIAGNOSIS — M797 Fibromyalgia: Secondary | ICD-10-CM

## 2022-04-03 DIAGNOSIS — R5383 Other fatigue: Secondary | ICD-10-CM

## 2022-04-03 DIAGNOSIS — I252 Old myocardial infarction: Secondary | ICD-10-CM

## 2022-04-03 DIAGNOSIS — M25462 Effusion, left knee: Secondary | ICD-10-CM

## 2022-04-03 DIAGNOSIS — Z8679 Personal history of other diseases of the circulatory system: Secondary | ICD-10-CM

## 2022-04-03 DIAGNOSIS — Z8719 Personal history of other diseases of the digestive system: Secondary | ICD-10-CM

## 2022-04-03 DIAGNOSIS — F5101 Primary insomnia: Secondary | ICD-10-CM

## 2022-04-03 DIAGNOSIS — R748 Abnormal levels of other serum enzymes: Secondary | ICD-10-CM

## 2022-04-03 DIAGNOSIS — M19041 Primary osteoarthritis, right hand: Secondary | ICD-10-CM

## 2022-04-03 DIAGNOSIS — Z853 Personal history of malignant neoplasm of breast: Secondary | ICD-10-CM

## 2022-04-03 DIAGNOSIS — R251 Tremor, unspecified: Secondary | ICD-10-CM

## 2022-04-03 DIAGNOSIS — R2689 Other abnormalities of gait and mobility: Secondary | ICD-10-CM

## 2022-04-03 DIAGNOSIS — M67911 Unspecified disorder of synovium and tendon, right shoulder: Secondary | ICD-10-CM

## 2022-04-03 DIAGNOSIS — G2581 Restless legs syndrome: Secondary | ICD-10-CM

## 2022-08-12 NOTE — Progress Notes (Signed)
Office Visit Note  Patient: Whitney Ward             Date of Birth: 08/24/1950           MRN: [id number]             PCP: Rocky Morel, MD Referring: Huey Romans* Visit Date: 08/26/2022 Occupation: '@GUAROCC'$ @  Subjective:  Chief complaint: Lower back pain   History of Present Illness: Whitney Ward is a 72 y.o. female with history of osteoarthritis, fibromyalgia and trochanteric bursitis. Today she complains of lower back pain and flares of fibromyalgia.  Neuropathic pain started about a week ago.  She states the back pain shoots down and wraps around to her front and causes difficulty walking. She has some difficulty lying down and the pain is better when sitting. She went to the emergency department in [facility] because the pain was so severe and all studies were normal. Last office visit in 01/2022 she had left knee swelling and received a cortisone injection. States the knee is feeling well today. She routinely stretches and line dances for exercise. She is retired.   Activities of Daily Living:  Patient reports morning stiffness for 1 hour.   Patient Reports nocturnal pain.  Difficulty dressing/grooming: Denies Difficulty climbing stairs: Reports Difficulty getting out of chair: Denies Difficulty using hands for taps, buttons, cutlery, and/or writing: Reports  Review of Systems  Constitutional:  Positive for fatigue.  HENT:  Negative for mouth sores and mouth dryness.   Eyes:  Negative for dryness.  Respiratory:  Positive for shortness of breath.   Cardiovascular:  Negative for chest pain and palpitations.  Gastrointestinal:  Negative for blood in stool, constipation and diarrhea.  Endocrine: Negative for increased urination.  Genitourinary:  Negative for involuntary urination.  Musculoskeletal:  Positive for joint pain, gait problem, joint pain, myalgias, muscle weakness, morning stiffness, muscle tenderness and myalgias. Negative for  joint swelling.  Skin:  Negative for color change, rash, hair loss and sensitivity to sunlight.  Allergic/Immunologic: Negative for susceptible to infections.  Neurological:  Negative for dizziness and headaches.  Hematological:  Negative for swollen glands.  Psychiatric/Behavioral:  Positive for depressed mood and sleep disturbance. The patient is nervous/anxious.     PMFS History:  Patient Active Problem List   Diagnosis Date Noted   Fibromyalgia 10/09/2016   Primary insomnia 10/09/2016   Other fatigue 10/09/2016   Tendinopathy of right shoulder 10/09/2016   Trochanteric bursitis of right hip 10/09/2016   Primary osteoarthritis of both hands 10/09/2016   Chronic kidney disease 10/09/2016   Essential hypertension 10/09/2016   Gastroesophageal reflux disease 10/09/2016   Restless leg syndrome 10/09/2016   Malignant neoplasm of female breast ([place]) 10/09/2016   History of breast cancer 10/09/2016   Genital herpes simplex 10/09/2016    Past Medical History:  Diagnosis Date   Arthritis    Breast cancer ([place])    Breast cancer ([place])    Broken toe    CAD (coronary artery disease)    Fibromyalgia    Hypertension    MI (myocardial infarction) ([place])     Family History  Problem Relation Age of Onset   Lupus Mother    Past Surgical History:  Procedure Laterality Date   ABDOMINAL HYSTERECTOMY     BREAST SURGERY     CESAREAN SECTION     ingrown toenail  03/21/2021   SMALL INTESTINE SURGERY     TONSILLECTOMY     Social History  Social History Narrative   Not on file   Immunization History  Administered Date(s) Administered   Moderna SARS-COV2 Booster Vaccination 05/14/2020   Moderna Sars-Covid-2 Vaccination 07/07/2019, 08/04/2019   PFIZER(Purple Top)SARS-COV-2 Vaccination 03/20/2021     Objective: Vital Signs: BP 124/83 (BP Location: Right Arm, Patient Position: Sitting, Cuff Size: Normal)   Pulse 78   Resp 14   Ht 6' (1.829 m)   Wt 227 lb (103 kg)   BMI 30.79  kg/m    Physical Exam Vitals and nursing note reviewed.  Constitutional:      Appearance: She is well-developed.  HENT:     Head: Normocephalic and atraumatic.  Eyes:     Conjunctiva/sclera: Conjunctivae normal.  Cardiovascular:     Rate and Rhythm: Normal rate and regular rhythm.     Heart sounds: Normal heart sounds.  Pulmonary:     Effort: Pulmonary effort is normal.     Breath sounds: Normal breath sounds.  Abdominal:     General: Bowel sounds are normal.     Palpations: Abdomen is soft.  Musculoskeletal:     Cervical back: Normal range of motion.  Lymphadenopathy:     Cervical: No cervical adenopathy.  Skin:    General: Skin is warm and dry.     Capillary Refill: Capillary refill takes less than 2 seconds.  Neurological:     Mental Status: She is alert and oriented to person, place, and time.  Psychiatric:        Behavior: Behavior normal.      Musculoskeletal Exam: Cervical spine had good range of motion. Difficulty ambulating to exam table due to lumbar pain. Tender to palpation over the lumbar spine. Shoulder joints, elbow joints, wrist joints, MCPs PIPs and DIPs have good range of motion with no synovitis, warmth or effusion. Hip joints, knee joints, ankles, MTPs and PIPs with good range of motion with no synovitis, warmth or effusion.   CDAI Exam: CDAI Score: -- Patient Global: --; Provider Global: -- Swollen: --; Tender: -- Joint Exam 08/26/2022   No joint exam has been documented for this visit   There is currently no information documented on the homunculus. Go to the Rheumatology activity and complete the homunculus joint exam.  Investigation: No additional findings.  Imaging: No results found.  Recent Labs: Lab Results  Component Value Date   WBC 4.8 09/01/2018   HGB 12.5 09/01/2018   PLT 258 09/01/2018   NA 143 09/01/2018   K 3.7 09/01/2018   CL 105 09/01/2018   CO2 30 09/01/2018   GLUCOSE 95 09/01/2018   BUN 13 09/01/2018   CREATININE  1.01 (H) 09/01/2018   BILITOT 0.6 09/01/2018   ALKPHOS 99 10/15/2016   AST 21 09/01/2018   ALT 16 09/01/2018   PROT 7.1 09/01/2018   ALBUMIN 4.4 10/15/2016   CALCIUM 9.5 09/01/2018   GFRAA 66 09/01/2018   February 18, 2022 CRP 10.7, sed rate 28, uric acid 6.4, RF negative, anti-CCP negative, '14 3 3 '$ ETA negative  Speciality Comments: No specialty comments available.  Procedures:  No procedures performed Allergies: Codeine, Penicillins, Sulfa antibiotics, Tramadol, Aromasin [exemestane], Atorvastatin, Celexa [citalopram hydrobromide], Citalopram, and Tamoxifen   Assessment / Plan:     Visit Diagnoses: Tendinopathy of right shoulder - History of intermittent discomfort. Not bothersome today. Full ROM on exam.   Degenerative disc disease, lumbar - Patent complains of increased lumbar back pain and difficulty walking. X-ray in 02/2021 revealed arthritic changes.  X-rays were reviewed with the  patient.  She had difficulty ambulating to the exam table and difficulty lying on the exam table. Tender to palpation on exam. Discussed the importance of core exercises. Provided exercises in AVS. Will refer patient to water therapy and begin robaxin 500 mg TID PRN x 10 days. Provided parking placard.   Scoliosis, lumbar - X-ray in 02/2021 revealed scoliosis of the lumbar spine. Emphasized the importance of core exercises. Provided exercises in AVS. Referral to water therapy.   Primary osteoarthritis of both hands - Not bothersome today.   Trochanteric bursitis of both hips - Resolved.   Swelling of left knee joint - Seen on February 18, 2022 with acute joint swelling.  She had cortisone injection and joint aspiration at that visit. There was not enough fluid for analysis. On 02/18/2022 CRP was 10.7. Sed rate, uric acid, rheumatoid factor, CCP and 14-3-3 eta protein were WNL. She is still getting relief from the cortisone injection. Full ROM, no warmth or effusion of the left knee joint on physical exam.    Fibromyalgia - She has intermittent myalgias and muscle tenderness due to fibromyalgia. She continues to be on gabapentin.  She takes Tylenol and had some as needed basis. Provided exercises in AVS and discussed the importance of sleep hygiene.   Other fatigue - Secondary to insomnia. Discussed the importance of regular exercise and good sleep hygiene.   Primary insomnia - She is on Klonopin 1 mg p.o. nightly and trazodone 150 mg p.o. nightly.   Balance problems - difficulty ambulating to exam table secondary to back pain. Referred patient to water therapy and provided core exercises in the AVS.   Other medical conditions are listed as follows:   Elevated CK   History of MI (myocardial infarction)  History of hypertension  History of breast cancer  History of gastroesophageal reflux (GERD)  Occasional tremors  Restless leg syndrome  Orders: Orders Placed This Encounter  Procedures   Ambulatory referral to Physical Therapy   Meds ordered this encounter  Medications   methocarbamol (ROBAXIN) 500 MG tablet    Sig: Take 1 tablet (500 mg total) by mouth 3 (three) times daily.    Dispense:  30 tablet    Refill:  0    Face-to-face time spent with patient was 30 minutes. Greater than 50% of time was spent in counseling and coordination of care.  Follow-Up Instructions: Return in about 6 months (around 02/24/2023) for Osteoarthritis, FMS.   Bo Merino, MD  Note - This record has been created using Editor, commissioning.  Chart creation errors have been sought, but may not always  have been located. Such creation errors do not reflect on  the standard of medical care.

## 2022-08-26 ENCOUNTER — Ambulatory Visit: Payer: Medicare HMO | Attending: Rheumatology | Admitting: Rheumatology

## 2022-08-26 ENCOUNTER — Encounter: Payer: Self-pay | Admitting: Rheumatology

## 2022-08-26 VITALS — BP 124/83 | HR 78 | Resp 14 | Ht 72.0 in | Wt 227.0 lb

## 2022-08-26 DIAGNOSIS — M5136 Other intervertebral disc degeneration, lumbar region: Secondary | ICD-10-CM

## 2022-08-26 DIAGNOSIS — M7062 Trochanteric bursitis, left hip: Secondary | ICD-10-CM

## 2022-08-26 DIAGNOSIS — M797 Fibromyalgia: Secondary | ICD-10-CM

## 2022-08-26 DIAGNOSIS — M25462 Effusion, left knee: Secondary | ICD-10-CM | POA: Diagnosis not present

## 2022-08-26 DIAGNOSIS — Z8719 Personal history of other diseases of the digestive system: Secondary | ICD-10-CM

## 2022-08-26 DIAGNOSIS — M67911 Unspecified disorder of synovium and tendon, right shoulder: Secondary | ICD-10-CM

## 2022-08-26 DIAGNOSIS — Z8679 Personal history of other diseases of the circulatory system: Secondary | ICD-10-CM

## 2022-08-26 DIAGNOSIS — G2581 Restless legs syndrome: Secondary | ICD-10-CM

## 2022-08-26 DIAGNOSIS — R5383 Other fatigue: Secondary | ICD-10-CM

## 2022-08-26 DIAGNOSIS — F5101 Primary insomnia: Secondary | ICD-10-CM

## 2022-08-26 DIAGNOSIS — M19042 Primary osteoarthritis, left hand: Secondary | ICD-10-CM

## 2022-08-26 DIAGNOSIS — R748 Abnormal levels of other serum enzymes: Secondary | ICD-10-CM

## 2022-08-26 DIAGNOSIS — R2689 Other abnormalities of gait and mobility: Secondary | ICD-10-CM

## 2022-08-26 DIAGNOSIS — Z853 Personal history of malignant neoplasm of breast: Secondary | ICD-10-CM

## 2022-08-26 DIAGNOSIS — M7061 Trochanteric bursitis, right hip: Secondary | ICD-10-CM

## 2022-08-26 DIAGNOSIS — M19041 Primary osteoarthritis, right hand: Secondary | ICD-10-CM

## 2022-08-26 DIAGNOSIS — M4156 Other secondary scoliosis, lumbar region: Secondary | ICD-10-CM

## 2022-08-26 DIAGNOSIS — I252 Old myocardial infarction: Secondary | ICD-10-CM

## 2022-08-26 DIAGNOSIS — R251 Tremor, unspecified: Secondary | ICD-10-CM

## 2022-08-26 MED ORDER — METHOCARBAMOL 500 MG PO TABS: 500.0000 mg | ORAL_TABLET | Freq: Three times a day (TID) | ORAL | 0 refills | Status: DC

## 2022-08-26 NOTE — Patient Instructions (Addendum)
Back Exercises The following exercises strengthen the muscles that help to support the trunk (torso) and back. They also help to keep the lower back flexible. Doing these exercises can help to prevent or lessen existing low back pain. If you have back pain or discomfort, try doing these exercises 2-3 times each day or as told by your health care provider. As your pain improves, do them once each day, but increase the number of times that you repeat the steps for each exercise (do more repetitions). To prevent the recurrence of back pain, continue to do these exercises once each day or as told by your health care provider. Do exercises exactly as told by your health care provider and adjust them as directed. It is normal to feel mild stretching, pulling, tightness, or discomfort as you do these exercises, but you should stop right away if you feel sudden pain or your pain gets worse. Exercises Single knee to chest Repeat these steps 3-5 times for each leg: Lie on your back on a firm bed or the floor with your legs extended. Bring one knee to your chest. Your other leg should stay extended and in contact with the floor. Hold your knee in place by grabbing your knee or thigh with both hands and hold. Pull on your knee until you feel a gentle stretch in your lower back or buttocks. Hold the stretch for 10-30 seconds. Slowly release and straighten your leg.  Pelvic tilt Repeat these steps 5-10 times: Lie on your back on a firm bed or the floor with your legs extended. Bend your knees so they are pointing toward the ceiling and your feet are flat on the floor. Tighten your lower abdominal muscles to press your lower back against the floor. This motion will tilt your pelvis so your tailbone points up toward the ceiling instead of pointing to your feet or the floor. With gentle tension and even breathing, hold this position for 5-10 seconds.  Cat-cow Repeat these steps until your lower back becomes  more flexible: Get into a hands-and-knees position on a firm bed or the floor. Keep your hands under your shoulders, and keep your knees under your hips. You may place padding under your knees for comfort. Let your head hang down toward your chest. Contract your abdominal muscles and point your tailbone toward the floor so your lower back becomes rounded like the back of a cat. Hold this position for 5 seconds. Slowly lift your head, let your abdominal muscles relax, and point your tailbone up toward the ceiling so your back forms a sagging arch like the back of a cow. Hold this position for 5 seconds.  Press-ups Repeat these steps 5-10 times: Lie on your abdomen (face-down) on a firm bed or the floor. Place your palms near your head, about shoulder-width apart. Keeping your back as relaxed as possible and keeping your hips on the floor, slowly straighten your arms to raise the top half of your body and lift your shoulders. Do not use your back muscles to raise your upper torso. You may adjust the placement of your hands to make yourself more comfortable. Hold this position for 5 seconds while you keep your back relaxed. Slowly return to lying flat on the floor.  Bridges Repeat these steps 10 times: Lie on your back on a firm bed or the floor. Bend your knees so they are pointing toward the ceiling and your feet are flat on the floor. Your arms should be flat   at your sides, next to your body. Tighten your buttocks muscles and lift your buttocks off the floor until your waist is at almost the same height as your knees. You should feel the muscles working in your buttocks and the back of your thighs. If you do not feel these muscles, slide your feet 1-2 inches (2.5-5 cm) farther away from your buttocks. Hold this position for 3-5 seconds. Slowly lower your hips to the starting position, and allow your buttocks muscles to relax completely. If this exercise is too easy, try doing it with your arms  crossed over your chest. Abdominal crunches Repeat these steps 5-10 times: Lie on your back on a firm bed or the floor with your legs extended. Bend your knees so they are pointing toward the ceiling and your feet are flat on the floor. Cross your arms over your chest. Tip your chin slightly toward your chest without bending your neck. Tighten your abdominal muscles and slowly raise your torso high enough to lift your shoulder blades a tiny bit off the floor. Avoid raising your torso higher than that because it can put too much stress on your lower back and does not help to strengthen your abdominal muscles. Slowly return to your starting position.  Back lifts Repeat these steps 5-10 times: Lie on your abdomen (face-down) with your arms at your sides, and rest your forehead on the floor. Tighten the muscles in your legs and your buttocks. Slowly lift your chest off the floor while you keep your hips pressed to the floor. Keep the back of your head in line with the curve in your back. Your eyes should be looking at the floor. Hold this position for 3-5 seconds. Slowly return to your starting position.  Contact a health care provider if: Your back pain or discomfort gets much worse when you do an exercise. Your worsening back pain or discomfort does not lessen within 2 hours after you exercise. If you have any of these problems, stop doing these exercises right away. Do not do them again unless your health care provider says that you can. Get help right away if: You develop sudden, severe back pain. If this happens, stop doing the exercises right away. Do not do them again unless your health care provider says that you can. This information is not intended to replace advice given to you by your health care provider. Make sure you discuss any questions you have with your health care provider. Document Revised: 12/11/2020 Document Reviewed: 08/29/2020 Elsevier Patient Education  [place] Tablets What is this medication? METHOCARBAMOL (meth oh KAR ba mole) treats muscle pain and stiffness. It works by calming overactive nerves in your body, which helps your muscles relax. It belongs to a group of medications called muscle relaxants. This medicine may be used for other purposes; ask your health care provider or pharmacist if you have questions. COMMON BRAND NAME(S): Robaxin What should I tell my care team before I take this medication? They need to know if you have any of these conditions: Kidney disease Seizures An unusual or allergic reaction to methocarbamol, other medications, foods, dyes, or preservatives Pregnant or trying to get pregnant Breast-feeding How should I use this medication? Take this medication by mouth with a full glass of water. Follow the directions on the prescription label. Take your medication at regular intervals. Do not take your medication more often than directed. Talk to your care team about the use of this medication in  children. Special care may be needed. Overdosage: If you think you have taken too much of this medicine contact a poison control center or emergency room at once. NOTE: This medicine is only for you. Do not share this medicine with others. What if I miss a dose? If you miss a dose, take it as soon as you can. If it is almost time for your next dose, take only the next dose. Do not take double or extra doses. What may interact with this medication? Do not take this medication with any of the following: Opioid medications for cough This medication may also interact with the following: Alcohol Antihistamines for allergy, cough, and cold Certain medications for anxiety or sleep Certain medications for depression, such as amitriptyline, fluoxetine, or sertraline Certain medications for seizures, such as phenobarbital or primidone Cholinesterase inhibitors, such as neostigmine, ambenonium, or pyridostigmine  bromide General anesthetics, such as halothane, isoflurane, methoxyflurane, or propofol Local anesthetics, such as lidocaine, pramoxine, or tetracaine Medications that relax muscles for surgery Opioids Phenothiazines, such as chlorpromazine, mesoridazine, prochlorperazine, thioridazine This list may not describe all possible interactions. Give your health care provider a list of all the medicines, herbs, non-prescription drugs, or dietary supplements you use. Also tell them if you smoke, drink alcohol, or use illegal drugs. Some items may interact with your medicine. What should I watch for while using this medication? Visit your care team for regular checks on your progress. Tell your care team if your symptoms do not start to get better or if they get worse. This medication may affect your coordination, reaction time, or judgment. Do not drive or operate machinery until you know how this medication affects you. Sit up or stand slowly to reduce the risk of dizzy or fainting spells. Drinking alcohol with this medication can increase the risk of these side effects. Taking this medication with other substances that cause drowsiness, such as alcohol, benzodiazepines, or opioids can cause serious side effects. Give your care team a list of all medications you use. They will tell you how much medication to take. Do not take more medication than directed. Call emergency services if you have problems breathing or staying awake. What side effects may I notice from receiving this medication? Side effects that you should report to your care team as soon as possible: Allergic reactions--skin rash, itching, hives, swelling of the face, lips, tongue, or throat CNS depression--slow or shallow breathing, shortness of breath, feeling faint, dizziness, confusion, trouble staying awake Side effects that usually do not require medical attention (report to your care team if they continue or are  bothersome): Dizziness Drowsiness Headache Metallic taste in mouth Upset stomach This list may not describe all possible side effects. Call your doctor for medical advice about side effects. You may report side effects to FDA at 1-800-FDA-1088. Where should I keep my medication? Keep out of the reach of children. Store at room temperature between 20 and 25 degrees C (68 and 77 degrees F). Keep container tightly closed. Throw away any unused medication after the expiration date. NOTE: This sheet is a summary. It may not cover all possible information. If you have questions about this medicine, talk to your doctor, pharmacist, or health care provider.  2023 Elsevier/Gold Standard (2021-12-25 00:00:00)

## 2022-10-07 ENCOUNTER — Ambulatory Visit (HOSPITAL_BASED_OUTPATIENT_CLINIC_OR_DEPARTMENT_OTHER): Payer: Medicare HMO | Admitting: Physical Therapy

## 2022-11-03 ENCOUNTER — Ambulatory Visit (HOSPITAL_BASED_OUTPATIENT_CLINIC_OR_DEPARTMENT_OTHER): Payer: Medicare HMO | Admitting: Physical Therapy

## 2022-11-06 NOTE — Therapy (Signed)
OUTPATIENT PHYSICAL THERAPY THORACOLUMBAR EVALUATION   Patient Name: Whitney Ward MRN: [id number] DOB:1950/09/27, 72 y.o., female Today's Date: 11/07/2022  END OF SESSION:  Whitney Ward End of Session - 11/07/22 1458     Visit Number 1    Number of Visits 12    Date for Whitney Ward Re-Evaluation 01/02/23    Authorization Type [employer] mcr    Whitney Ward Start Time 1300    Whitney Ward Stop Time 1340    Whitney Ward Time Calculation (min) 40 min    Activity Tolerance Patient tolerated treatment well;Patient limited by pain    Behavior During Therapy [facility] for tasks assessed/performed             Past Medical History:  Diagnosis Date   Arthritis    Breast cancer (HCC)    Breast cancer (HCC)    Broken toe    CAD (coronary artery disease)    Fibromyalgia    Hypertension    MI (myocardial infarction) (HCC)    Past Surgical History:  Procedure Laterality Date   ABDOMINAL HYSTERECTOMY     BREAST SURGERY     CESAREAN SECTION     ingrown toenail  03/21/2021   SMALL INTESTINE SURGERY     TONSILLECTOMY     Patient Active Problem List   Diagnosis Date Noted   Fibromyalgia 10/09/2016   Primary insomnia 10/09/2016   Other fatigue 10/09/2016   Tendinopathy of right shoulder 10/09/2016   Trochanteric bursitis of right hip 10/09/2016   Primary osteoarthritis of both hands 10/09/2016   Chronic kidney disease 10/09/2016   Essential hypertension 10/09/2016   Gastroesophageal reflux disease 10/09/2016   Restless leg syndrome 10/09/2016   Malignant neoplasm of female breast (HCC) 10/09/2016   History of breast cancer 10/09/2016   Genital herpes simplex 10/09/2016    PCP: Elinor Dodge, MD   REFERRING PROVIDER: [name], MD   REFERRING DIAG: M51.36 (ICD-10-CM) - DDD (degenerative disc disease), lumbar   Rationale for Evaluation and Treatment: Rehabilitation  THERAPY DIAG:  Fibromyalgia  Other low back pain  Muscle weakness (generalized)  ONSET DATE: >10 yr  SUBJECTIVE:                                                                                                                                                                                            SUBJECTIVE STATEMENT: Whitney Ward reports pain for >10 years fibromyalgia mostly in le.  Lying down increases pain, turning over, sup <>sit. Chronic bursitis and knee OA, has  periodic steroid injections.   PERTINENT HISTORY:  a 72 y.o. female with history of osteoarthritis, fibromyalgia and trochanteric bursitis   PAIN:  Are you having pain? Yes: NPRS scale: current 3/10 Pain location: hips to legs towards ankle Pain description: dull ache, in bed achy Aggravating factors: lying down, walking > 5 minutes,  Relieving factors: gabapentin, OTC meds, resting in recliner.  PRECAUTIONS: Fall  WEIGHT BEARING RESTRICTIONS: No  FALLS:  Has patient fallen in last 6 months? No  LIVING ENVIRONMENT: Lives with: lives with their family and lives with their spouse Lives in: House/apartment Stairs: Yes: Internal: 16 steps; can reach both Has following equipment at home: None  OCCUPATION: Retired Therapist, music  PLOF: Independent  PATIENT GOALS: Be able to turn in bed with less pain, increase mobility, decrease fatigue    OBJECTIVE:   DIAGNOSTIC FINDINGS:  Degenerative disc disease, lumbar - X-ray in 02/2021 revealed arthritic changes; scoliosis of the lumbar spine   PATIENT SURVEYS:  FOTO primary score 45% with goal of 54%  SCREENING FOR RED FLAGS:   WFL  COGNITION: Overall cognitive status: Within functional limits for tasks assessed     SENSATION: WFL  MUSCLE LENGTH: Hamstrings: Right 70 deg; Left 80 deg   POSTURE: No Significant postural limitations  PALPATION: TTP bilat hip bursas  LUMBAR ROM:   AROM eval  Flexion Below patella P!  Extension wfl  Right lateral flexion Wfl P!  Left lateral flexion Wfl P!  Right rotation   Left rotation    (Blank rows = not tested)  LOWER EXTREMITY ROM:      wfl  LOWER EXTREMITY MMT:    MMT Right eval Left eval  Hip flexion 4 P! 4P!  Hip extension    Hip abduction 4P! 4P!  Hip adduction    Hip internal rotation    Hip external rotation    Knee flexion 5 5  Knee extension    Ankle dorsiflexion    Ankle plantarflexion    Ankle inversion    Ankle eversion     (Blank rows = not tested)  LUMBAR SPECIAL TESTS:  Straight leg raise test: Negative, Slump test: Negative, and Thomas test: Negative  FUNCTIONAL TESTS:  5 times sit to stand: from pool bench 32.41 Timed up and go (TUG): 10.58 4 stage balance test: wfl  GAIT: Distance walked: 200 Assistive device utilized: None Level of assistance: Complete Independence Comments: wfl  TODAY'S TREATMENT:                                                                                                                              Eval Objective testing FOTO   PATIENT EDUCATION:  Education details: Discussed eval findings, rehab rationale, aquatic program progression/POC and pools in area. Patient is in agreement   Person educated: Patient Education method: Explanation Education comprehension: verbalized understanding  HOME EXERCISE PROGRAM: TBA  ASSESSMENT:  CLINICAL IMPRESSION: Patient is a 72 y.o. f who was seen today for physical therapy evaluation and treatment for DDD Lumbar spine. She does not report any radicular symptoms nor are any of the  special tests positive for SIJ dysfunction or sciatica.  Her main complaint is her fibromyalgia limiting symptoms of fatigue and pain from waist to feet. She does have chronic hip bursitis bilaterally and oa knees. She has had multiple episodes of physical therapy in the past. Whitney Ward edu on benefits of aquatic warm water therapy with fibromyalgia and vu of the importance of continuing with an aquatic HEP after DC for benefits to continue. She is interested in returning to land based therapy for an updated HEP in which she can complete in event  she does not gain as access to a pool or as an addition to.  She is a good candidate for skilled physical therapy both aquatic and land based to improve strength, endurance and toleration to activity improving her QOL and improving her management of her chronic conditions. Whitney Ward reports she is not afraid of water but she does not swim.    OBJECTIVE IMPAIRMENTS: decreased activity tolerance, decreased endurance, decreased mobility, decreased strength, and pain.   ACTIVITY LIMITATIONS: lifting, bending, standing, squatting, stairs, transfers, and bed mobility  PARTICIPATION LIMITATIONS: meal prep, cleaning, laundry, shopping, community activity, and yard work  PERSONAL FACTORS: Time since onset of injury/illness/exacerbation are also affecting patient's functional outcome.   REHAB POTENTIAL: Good  CLINICAL DECISION MAKING: Evolving/moderate complexity  EVALUATION COMPLEXITY: Moderate   GOALS: Goals reviewed with patient? Yes  SHORT TERM GOALS: Target date: 12/05/22  Whitney Ward will tolerate full aquatic sessions consistently without increase in pain and with improving function to demonstrate good toleration and effectiveness of intervention.   Baseline: Goal status: INITIAL  2.  Whitney Ward will improve on 5 X STS test to <or= 20s to demonstrate improving functional lower extremity strength, transitional movements, and balance  Baseline: 32.41 Goal status: INITIAL  3.  Whitney Ward will decide on long term benefit of aquatic therapy intervention and gain access to pool if advantageous. Baseline: uncertain of benefit Goal status: INITIAL  4.  Whitney Ward will have decreased P! With lumbar ROM. Baseline: See chart Goal status: INITIAL    LONG TERM GOALS: Target date: 01/02/23  Whitney Ward to meet stated Foto Goal 54% Baseline: 45% Goal status: INITIAL  2.  Whitney Ward will be indep with final HEP's (land and aquatic as appropriate) for continued management of condition  Baseline:  Goal status: INITIAL  3.  Whitney Ward will report turning  in bed without increase in pain Baseline: sup<>sit and turning painful Goal status: INITIAL  4.  Whitney Ward will be able to walk 1 mile to demonstrate improved mobility/personal goal Baseline: 497ft Goal status: INITIAL  5.  Whitney Ward to report less fatigue being able tolerate daily activity without being "done"  until 500 pm. Baseline: 300p Goal status: INITIAL    PLAN:  Whitney Ward FREQUENCY: 1-2x/week  Whitney Ward DURATION: 8 weeks 12 visits (first 4 aquatic then alternating)  PLANNED INTERVENTIONS: Therapeutic exercises, Therapeutic activity, Neuromuscular re-education, Balance training, Gait training, Patient/Family education, Self Care, Joint mobilization, Stair training, Orthotic/Fit training, DME instructions, Aquatic Therapy, Dry Needling, Electrical stimulation, Cryotherapy, Moist heat, Splintting, Taping, Biofeedback, Ionotophoresis 4mg /ml Dexamethasone, Manual therapy, and Re-evaluation.  PLAN FOR NEXT SESSION: aquatic and land based: Instruction on strengthening, stretching and balance exercises, establish HEP, Whitney Ward edu on management of chronic conditions   Corrie Dandy Tomma Lightning) Nicasio Barlowe MPT 11/07/2022, 2:59 PM   Referring diagnosis? M51.36 (ICD-10-CM) - DDD (degenerative disc disease), lumbar  Treatment diagnosis? (if different than referring diagnosis) same What was this (referring dx) caused by? []  Surgery []  Fall [x]  Ongoing issue [x]  Arthritis []   Other: ____________  Laterality: []  Rt []  Lt [x]  Both  Check all possible CPT codes:  *CHOOSE 10 OR LESS*    [x]  97110 (Therapeutic Exercise)  []  92507 (SLP Treatment)  [x]  97112 (Neuro Re-ed)   []  92526 (Swallowing Treatment)   [x]  97116 (Gait Training)   []  [id number] (Cognitive Training, 1st 15 minutes) [x]  97140 (Manual Therapy)   []  97130 (Cognitive Training, each add'l 15 minutes)  [x]  97164 (Re-evaluation)                              []  Other, List CPT Code ____________  [x]  97530 (Therapeutic Activities)     [x]  97535 (Self Care)   [x]  All codes  above (97110 - 97535)  []  97012 (Mechanical Traction)  []  97014 (E-stim Unattended)  []  16109 (E-stim manual)  []  97033 (Ionto)  []  97035 (Ultrasound) []  97750 (Physical Performance Training) [x]  [id number] (Aquatic Therapy) []  97016 (Vasopneumatic Device) []  [id number] (Paraffin) []  97034 (Contrast Bath) []  97597 (Wound Care 1st 20 sq cm) []  97598 (Wound Care each add'l 20 sq cm) []  97760 (Orthotic Fabrication, Fitting, Training Initial) []  [id number] (Prosthetic Management and Training Initial) []  [id number] (Orthotic or Prosthetic Training/ Modification Subsequent)

## 2022-11-07 ENCOUNTER — Encounter (HOSPITAL_BASED_OUTPATIENT_CLINIC_OR_DEPARTMENT_OTHER): Payer: Self-pay | Admitting: Physical Therapy

## 2022-11-07 ENCOUNTER — Other Ambulatory Visit: Payer: Self-pay

## 2022-11-07 ENCOUNTER — Ambulatory Visit (HOSPITAL_BASED_OUTPATIENT_CLINIC_OR_DEPARTMENT_OTHER): Payer: Medicare HMO | Attending: Rheumatology | Admitting: Physical Therapy

## 2022-11-07 DIAGNOSIS — M797 Fibromyalgia: Secondary | ICD-10-CM | POA: Diagnosis present

## 2022-11-07 DIAGNOSIS — M6281 Muscle weakness (generalized): Secondary | ICD-10-CM | POA: Diagnosis present

## 2022-11-07 DIAGNOSIS — M5136 Other intervertebral disc degeneration, lumbar region: Secondary | ICD-10-CM | POA: Diagnosis not present

## 2022-11-07 DIAGNOSIS — M5459 Other low back pain: Secondary | ICD-10-CM | POA: Diagnosis present

## 2022-11-14 ENCOUNTER — Ambulatory Visit (HOSPITAL_BASED_OUTPATIENT_CLINIC_OR_DEPARTMENT_OTHER): Payer: Medicare HMO | Admitting: Physical Therapy

## 2022-11-14 ENCOUNTER — Encounter (HOSPITAL_BASED_OUTPATIENT_CLINIC_OR_DEPARTMENT_OTHER): Payer: Self-pay | Admitting: Physical Therapy

## 2022-11-14 DIAGNOSIS — M5459 Other low back pain: Secondary | ICD-10-CM

## 2022-11-14 DIAGNOSIS — M6281 Muscle weakness (generalized): Secondary | ICD-10-CM

## 2022-11-14 DIAGNOSIS — M797 Fibromyalgia: Secondary | ICD-10-CM | POA: Diagnosis not present

## 2022-11-14 NOTE — Therapy (Signed)
OUTPATIENT PHYSICAL THERAPY THORACOLUMBAR EVALUATION   Patient Name: Whitney Ward MRN: [id number] DOB:03/15/1951, 72 y.o., female Today's Date: 11/14/2022  END OF SESSION:  PT End of Session - 11/14/22 1038     Visit Number 2    Number of Visits 12    Date for PT Re-Evaluation 01/02/23    Authorization Type [employer] m[employer]    Authorization - Visit Number 2    Progress Note Due on Visit 10    PT Start Time 1034    PT Stop Time 1115    PT Time Calculation (min) 41 min    Behavior During Therapy WFL for tasks assessed/performed             Past Medical History:  Diagnosis Date   Arthritis    Breast cancer (HCC)    Breast cancer (HCC)    Broken toe    CAD (coronary artery disease)    Fibromyalgia    Hypertension    MI (myocardial infarction) (HCC)    Past Surgical History:  Procedure Laterality Date   ABDOMINAL HYSTERECTOMY     BREAST SURGERY     CESAREAN SECTION     ingrown toenail  03/21/2021   SMALL INTESTINE SURGERY     TONSILLECTOMY     Patient Active Problem List   Diagnosis Date Noted   Fibromyalgia 10/09/2016   Primary insomnia 10/09/2016   Other fatigue 10/09/2016   Tendinopathy of right shoulder 10/09/2016   Trochanteric bursitis of right hip 10/09/2016   Primary osteoarthritis of both hands 10/09/2016   Chronic kidney disease 10/09/2016   Essential hypertension 10/09/2016   Gastroesophageal reflux disease 10/09/2016   Restless leg syndrome 10/09/2016   Malignant neoplasm of female breast (HCC) 10/09/2016   History of breast cancer 10/09/2016   Genital herpes simplex 10/09/2016    PCP: Elinor Dodge, MD   REFERRING PROVIDER: [name], MD   REFERRING DIAG: M51.36 (ICD-10-CM) - DDD (degenerative disc disease), lumbar   Rationale for Evaluation and Treatment: Rehabilitation  THERAPY DIAG:  Fibromyalgia  Other low back pain  Muscle weakness (generalized)  ONSET DATE: >10 yr  SUBJECTIVE:                                                                                                                                                                                            SUBJECTIVE STATEMENT: Pt reports no new changes since eval. She states she is member of [employer] in [place].   She has been going to [employer] and riding bike / doing ab machine 3x/wk.   PERTINENT HISTORY:  a 72 y.o. female with history  of osteoarthritis, fibromyalgia and trochanteric bursitis   PAIN:  Are you having pain? Yes: NPRS scale: current 3/10 Pain location: hips to legs towards ankle Pain description: dull ache, in bed achy Aggravating factors: lying down, walking > 5 minutes,  Relieving factors: gabapentin, OTC meds, resting in recliner.  PRECAUTIONS: Fall  WEIGHT BEARING RESTRICTIONS: No  FALLS:  Has patient fallen in last 6 months? No  LIVING ENVIRONMENT: Lives with: lives with their family and lives with their spouse Lives in: House/apartment Stairs: Yes: Internal: 16 steps; can reach both Has following equipment at home: None  OCCUPATION: Retired Therapist, music  PLOF: Independent  PATIENT GOALS: Be able to turn in bed with less pain, increase mobility, decrease fatigue    OBJECTIVE:   DIAGNOSTIC FINDINGS:  Degenerative disc disease, lumbar - X-ray in 02/2021 revealed arthritic changes; scoliosis of the lumbar spine   PATIENT SURVEYS:  FOTO primary score 45% with goal of 54%  SCREENING FOR RED FLAGS:   WFL  COGNITION: Overall cognitive status: Within functional limits for tasks assessed     SENSATION: WFL  MUSCLE LENGTH: Hamstrings: Right 70 deg; Left 80 deg   POSTURE: No Significant postural limitations  PALPATION: TTP bilat hip bursas  LUMBAR ROM:   AROM eval  Flexion Below patella P!  Extension wfl  Right lateral flexion Wfl P!  Left lateral flexion Wfl P!  Right rotation   Left rotation    (Blank rows = not tested)  LOWER EXTREMITY ROM:     wfl  LOWER EXTREMITY MMT:     MMT Right eval Left eval  Hip flexion 4 P! 4P!  Hip extension    Hip abduction 4P! 4P!  Hip adduction    Hip internal rotation    Hip external rotation    Knee flexion 5 5  Knee extension    Ankle dorsiflexion    Ankle plantarflexion    Ankle inversion    Ankle eversion     (Blank rows = not tested)  LUMBAR SPECIAL TESTS:  Straight leg raise test: Negative, Slump test: Negative, and Thomas test: Negative  FUNCTIONAL TESTS:  5 times sit to stand: from pool bench 32.41 Timed up and go (TUG): 10.58 4 stage balance test: wfl  GAIT: Distance walked: 200 Assistive device utilized: None Level of assistance: Complete Independence Comments: wfl  TODAY'S TREATMENT:                                                                                                                              Pt seen for aquatic therapy today.  Treatment took place in water 3.5-4.75 ft in depth at the [facility] pool. Temp of water was 91.  Pt entered/exited the pool via stairs independently with bilat rail. -with barbell:  walking forward/ backward  - without support: side stepping L/R; walking forward/backward - holding wall:  hip abdct/addct 2 x5; leg swings into hip flex/ ext 2x5 - marching forward/ backward  -  SLS x up to 6sec each LE;  SLS with 3 way toe tap x 5 each LE - TrA set with short noodle pull down x 5 -> solid noodle x 10 - trial of straddling noodle and cycling holding corner  - STS at bench with cues for TrA set, forward arm reach, hip hinge x 8  Pt requires the buoyancy and hydrostatic pressure of water for support, and to offload joints by unweighting joint load by at least 50 % in navel deep water and by at least 75-80% in chest to neck deep water.  Viscosity of the water is needed for resistance of strengthening. Water current perturbations provides challenge to standing balance requiring increased core activation.     PATIENT EDUCATION:  Education details:  reacquainting to aquatic therapy Person educated: Patient Education method: Explanation Education comprehension: verbalized understanding  HOME EXERCISE PROGRAM: TBA  ASSESSMENT:  CLINICAL IMPRESSION: Pt is confident in aquatic setting and able to take direction from therapist on deck.  She reported slight reduction in pain during session. Pt reacquainted with aquatic exercises.  She is 6 ft tall so she works well in the deepest water of pool, and without water step while at the bench.  Goals are ongoing. Encouraged pt to avoid ab machine at gym as to not aggravate her low back symptoms.    From eval:  Patient is a 72 y.o. f who was seen today for physical therapy evaluation and treatment for DDD Lumbar spine. She does not report any radicular symptoms nor are any of the special tests positive for SIJ dysfunction or sciatica.  Her main complaint is her fibromyalgia limiting symptoms of fatigue and pain from waist to feet. She does have chronic hip bursitis bilaterally and oa knees. She has had multiple episodes of physical therapy in the past. Pt edu on benefits of aquatic warm water therapy with fibromyalgia and vu of the importance of continuing with an aquatic HEP after DC for benefits to continue. She is interested in returning to land based therapy for an updated HEP in which she can complete in event she does not gain as access to a pool or as an addition to.  She is a good candidate for skilled physical therapy both aquatic and land based to improve strength, endurance and toleration to activity improving her QOL and improving her management of her chronic conditions. Pt reports she is not afraid of water but she does not swim.    OBJECTIVE IMPAIRMENTS: decreased activity tolerance, decreased endurance, decreased mobility, decreased strength, and pain.   ACTIVITY LIMITATIONS: lifting, bending, standing, squatting, stairs, transfers, and bed mobility  PARTICIPATION LIMITATIONS: meal prep,  cleaning, laundry, shopping, community activity, and yard work  PERSONAL FACTORS: Time since onset of injury/illness/exacerbation are also affecting patient's functional outcome.   REHAB POTENTIAL: Good  CLINICAL DECISION MAKING: Evolving/moderate complexity  EVALUATION COMPLEXITY: Moderate   GOALS: Goals reviewed with patient? Yes  SHORT TERM GOALS: Target date: 12/05/22  Pt will tolerate full aquatic sessions consistently without increase in pain and with improving function to demonstrate good toleration and effectiveness of intervention.   Baseline: Goal status: INITIAL  2.  Pt will improve on 5 X STS test to <or= 20s to demonstrate improving functional lower extremity strength, transitional movements, and balance  Baseline: 32.41 Goal status: INITIAL  3.  Pt will decide on long term benefit of aquatic therapy intervention and gain access to pool if advantageous. Baseline: uncertain of benefit Goal status: INITIAL  4.  Pt will have decreased P! With lumbar ROM. Baseline: See chart Goal status: INITIAL    LONG TERM GOALS: Target date: 01/02/23  Pt to meet stated Foto Goal 54% Baseline: 45% Goal status: INITIAL  2.  Pt will be indep with final HEP's (land and aquatic as appropriate) for continued management of condition  Baseline:  Goal status: INITIAL  3.  Pt will report turning in bed without increase in pain Baseline: sup<>sit and turning painful Goal status: INITIAL  4.  Pt will be able to walk 1 mile to demonstrate improved mobility/personal goal Baseline: 489ft Goal status: INITIAL  5.  Pt to report less fatigue being able tolerate daily activity without being "done"  until 500 pm. Baseline: 300p Goal status: INITIAL    PLAN:  PT FREQUENCY: 1-2x/week  PT DURATION: 8 weeks 12 visits (first 4 aquatic then alternating)  PLANNED INTERVENTIONS: Therapeutic exercises, Therapeutic activity, Neuromuscular re-education, Balance training, Gait training,  Patient/Family education, Self Care, Joint mobilization, Stair training, Orthotic/Fit training, DME instructions, Aquatic Therapy, Dry Needling, Electrical stimulation, Cryotherapy, Moist heat, Splintting, Taping, Biofeedback, Ionotophoresis 4mg /ml Dexamethasone, Manual therapy, and Re-evaluation.  PLAN FOR NEXT SESSION: aquatic and land based: Instruction on strengthening, stretching and balance exercises, establish HEP, pt edu on management of chronic conditions   Mayer Camel, PTA 11/14/22 11:26 AM [facility] [address] [place], [place], [address] Phone: [phone]   Fax:  [phone]   Referring diagnosis? M51.36 (ICD-10-CM) - DDD (degenerative disc disease), lumbar  Treatment diagnosis? (if different than referring diagnosis) same What was this (referring dx) caused by? []  Surgery []  Fall [x]  Ongoing issue [x]  Arthritis []  Other: ____________  Laterality: []  Rt []  Lt [x]  Both  Check all possible CPT codes:  *CHOOSE 10 OR LESS*    [x]  52841 (Therapeutic Exercise)  []  92507 (SLP Treatment)  [x]  97112 (Neuro Re-ed)   []  92526 (Swallowing Treatment)   [x]  97116 (Gait Training)   []  [id number] (Cognitive Training, 1st 15 minutes) [x]  97140 (Manual Therapy)   []  97130 (Cognitive Training, each add'l 15 minutes)  [x]  97164 (Re-evaluation)                              []  Other, List CPT Code ____________  [x]  97530 (Therapeutic Activities)     [x]  97535 (Self Care)   [x]  All codes above (97110 - 97535)  []  97012 (Mechanical Traction)  []  97014 (E-stim Unattended)  []  97032 (E-stim manual)  []  97033 (Ionto)  []  97035 (Ultrasound) []  97750 (Physical Performance Training) [x]  [id number] (Aquatic Therapy) []  97016 (Vasopneumatic Device) []  [id number] (Paraffin) []  97034 (Contrast Bath) []  97597 (Wound Care 1st 20 sq cm) []  97598 (Wound Care each add'l 20 sq cm) []  97760 (Orthotic Fabrication, Fitting, Training Initial) []   [id number] (Prosthetic Management and Training Initial) []  [id number] (Orthotic or Prosthetic Training/ Modification Subsequent)

## 2022-11-17 ENCOUNTER — Ambulatory Visit (HOSPITAL_BASED_OUTPATIENT_CLINIC_OR_DEPARTMENT_OTHER): Payer: Medicare HMO | Admitting: Physical Therapy

## 2022-11-17 ENCOUNTER — Encounter (HOSPITAL_BASED_OUTPATIENT_CLINIC_OR_DEPARTMENT_OTHER): Payer: Self-pay

## 2022-11-19 ENCOUNTER — Encounter (HOSPITAL_BASED_OUTPATIENT_CLINIC_OR_DEPARTMENT_OTHER): Payer: Self-pay

## 2022-11-19 ENCOUNTER — Ambulatory Visit (HOSPITAL_BASED_OUTPATIENT_CLINIC_OR_DEPARTMENT_OTHER): Payer: Medicare HMO | Admitting: Physical Therapy

## 2022-11-27 ENCOUNTER — Ambulatory Visit (HOSPITAL_BASED_OUTPATIENT_CLINIC_OR_DEPARTMENT_OTHER): Payer: Medicare HMO | Admitting: Physical Therapy

## 2022-11-27 ENCOUNTER — Encounter (HOSPITAL_BASED_OUTPATIENT_CLINIC_OR_DEPARTMENT_OTHER): Payer: Self-pay | Admitting: Physical Therapy

## 2022-11-27 DIAGNOSIS — M797 Fibromyalgia: Secondary | ICD-10-CM | POA: Diagnosis not present

## 2022-11-27 DIAGNOSIS — M6281 Muscle weakness (generalized): Secondary | ICD-10-CM

## 2022-11-27 DIAGNOSIS — M5459 Other low back pain: Secondary | ICD-10-CM

## 2022-11-27 NOTE — Therapy (Addendum)
OUTPATIENT PHYSICAL THERAPY THORACOLUMBAR EVALUATION   Patient Name: Whitney Ward MRN: [id number] DOB:[birth year], 72 y.o., female Today's Date: 11/27/2022  END OF SESSION:  PT End of Session - 11/27/22 1621     Visit Number 3    Number of Visits 12    Date for PT Re-Evaluation 01/02/23    Authorization Type [employer] m[employer]    Progress Note Due on Visit 10    PT Start Time 1618    PT Stop Time 1700    PT Time Calculation (min) 42 min    Activity Tolerance Patient tolerated treatment well    Behavior During Therapy WFL for tasks assessed/performed              Past Medical History:  Diagnosis Date   Arthritis    Breast cancer (HCC)    Breast cancer (HCC)    Broken toe    CAD (coronary artery disease)    Fibromyalgia    Hypertension    MI (myocardial infarction) (HCC)    Past Surgical History:  Procedure Laterality Date   ABDOMINAL HYSTERECTOMY     BREAST SURGERY     CESAREAN SECTION     ingrown toenail  03/21/2021   SMALL INTESTINE SURGERY     TONSILLECTOMY     Patient Active Problem List   Diagnosis Date Noted   Fibromyalgia 10/09/2016   Primary insomnia 10/09/2016   Other fatigue 10/09/2016   Tendinopathy of right shoulder 10/09/2016   Trochanteric bursitis of right hip 10/09/2016   Primary osteoarthritis of both hands 10/09/2016   Chronic kidney disease 10/09/2016   Essential hypertension 10/09/2016   Gastroesophageal reflux disease 10/09/2016   Restless leg syndrome 10/09/2016   Malignant neoplasm of female breast (HCC) 10/09/2016   History of breast cancer 10/09/2016   Genital herpes simplex 10/09/2016    PCP: Elinor Dodge, MD   REFERRING PROVIDER: [name], MD   REFERRING DIAG: M51.36 (ICD-10-CM) - DDD (degenerative disc disease), lumbar   Rationale for Evaluation and Treatment: Rehabilitation  THERAPY DIAG:  Other low back pain  Muscle weakness (generalized)  ONSET DATE: >10 yr  SUBJECTIVE:                                                                                                                                                                                            SUBJECTIVE STATEMENT: Pt reports good day with low pain.  She has not been back to gym for 2 weeks because not feeling well.  Has stopped using abd machine at gym as per instruction  PERTINENT HISTORY:  a 72 y.o. female with history of  osteoarthritis, fibromyalgia and trochanteric bursitis   PAIN:  Are you having pain? Yes: NPRS scale: current 2/10 Pain location: hips to legs towards ankle Pain description: dull ache, in bed achy Aggravating factors: lying down, walking > 5 minutes,  Relieving factors: gabapentin, OTC meds, resting in recliner.  PRECAUTIONS: Fall  WEIGHT BEARING RESTRICTIONS: No  FALLS:  Has patient fallen in last 6 months? No  LIVING ENVIRONMENT: Lives with: lives with their family and lives with their spouse Lives in: House/apartment Stairs: Yes: Internal: 16 steps; can reach both Has following equipment at home: None  OCCUPATION: Retired Therapist, music  PLOF: Independent  PATIENT GOALS: Be able to turn in bed with less pain, increase mobility, decrease fatigue    OBJECTIVE:   DIAGNOSTIC FINDINGS:  Degenerative disc disease, lumbar - X-ray in 02/2021 revealed arthritic changes; scoliosis of the lumbar spine   PATIENT SURVEYS:  FOTO primary score 45% with goal of 54%  SCREENING FOR RED FLAGS:   WFL  COGNITION: Overall cognitive status: Within functional limits for tasks assessed     SENSATION: WFL  MUSCLE LENGTH: Hamstrings: Right 70 deg; Left 80 deg   POSTURE: No Significant postural limitations  PALPATION: TTP bilat hip bursas  LUMBAR ROM:   AROM eval  Flexion Below patella P!  Extension wfl  Right lateral flexion Wfl P!  Left lateral flexion Wfl P!  Right rotation   Left rotation    (Blank rows = not tested)  LOWER EXTREMITY ROM:     wfl  LOWER EXTREMITY  MMT:    MMT Right eval Left eval  Hip flexion 4 P! 4P!  Hip extension    Hip abduction 4P! 4P!  Hip adduction    Hip internal rotation    Hip external rotation    Knee flexion 5 5  Knee extension    Ankle dorsiflexion    Ankle plantarflexion    Ankle inversion    Ankle eversion     (Blank rows = not tested)  LUMBAR SPECIAL TESTS:  Straight leg raise test: Negative, Slump test: Negative, and Thomas test: Negative  FUNCTIONAL TESTS:  5 times sit to stand: from pool bench 32.41 Timed up and go (TUG): 10.58 4 stage balance test: wfl  GAIT: Distance walked: 200 Assistive device utilized: None Level of assistance: Complete Independence Comments: wfl  TODAY'S TREATMENT:                                                                                                                              Pt seen for aquatic therapy today.  Treatment took place in water 3.5-4.75 ft in depth at the [facility] pool. Temp of water was 91.  Pt entered/exited the pool via stairs independently with bilat rail.  *unsupported:  walking forward/ backward  * without support: side stepping L/R; walking forward/backward *UE support yellow HB: toe raises; heel raises x10-12; SLS with 3 way toe tap x 5 each; leg swings  into hip flex/ ext 8 (difficult) *core engagement: yellow HB carry forward and back then side ways   - TrA set with solid noodle wide stance then staggered in 4.19ft x 10 ea *seated on bench: hip add/abd, slr/flutter kicking; LAQ; cycling *STS at bench with cues for TrA set, forward arm reach, hip hinge x 10 *hip hinge x 8 in standing *Assisted vertical suspension for decompression.  Pt with difficulty maintaining balance in position.   Pt requires the buoyancy and hydrostatic pressure of water for support, and to offload joints by unweighting joint load by at least 50 % in navel deep water and by at least 75-80% in chest to neck deep water.  Viscosity of the water is needed for  resistance of strengthening. Water current perturbations provides challenge to standing balance requiring increased core activation.     PATIENT EDUCATION:  Education details: reacquainting to aquatic therapy Person educated: Patient Education method: Explanation Education comprehension: verbalized understanding  HOME EXERCISE PROGRAM: TBA  ASSESSMENT:  CLINICAL IMPRESSION: Pt reports having been sick for the last 1.5 weeks.  Feeling better today.  She is progressed in strengthening and balance challenges decreasing ue support with exercise. Tolerates well.  Requires minor position adjustments for increased hip comfort/toleration.  Goals ongoing  Pt is confident in aquatic setting and able to take direction from therapist on deck.  She reported slight reduction in pain during session. Pt reacquainted with aquatic exercises.  She is 6 ft tall so she works well in the deepest water of pool, and without water step while at the bench.  Goals are ongoing. Encouraged pt to avoid ab machine at gym as to not aggravate her low back symptoms.    From eval:  Patient is a 72 y.o. f who was seen today for physical therapy evaluation and treatment for DDD Lumbar spine. She does not report any radicular symptoms nor are any of the special tests positive for SIJ dysfunction or sciatica.  Her main complaint is her fibromyalgia limiting symptoms of fatigue and pain from waist to feet. She does have chronic hip bursitis bilaterally and oa knees. She has had multiple episodes of physical therapy in the past. Pt edu on benefits of aquatic warm water therapy with fibromyalgia and vu of the importance of continuing with an aquatic HEP after DC for benefits to continue. She is interested in returning to land based therapy for an updated HEP in which she can complete in event she does not gain as access to a pool or as an addition to.  She is a good candidate for skilled physical therapy both aquatic and land based to  improve strength, endurance and toleration to activity improving her QOL and improving her management of her chronic conditions. Pt reports she is not afraid of water but she does not swim.    OBJECTIVE IMPAIRMENTS: decreased activity tolerance, decreased endurance, decreased mobility, decreased strength, and pain.   ACTIVITY LIMITATIONS: lifting, bending, standing, squatting, stairs, transfers, and bed mobility  PARTICIPATION LIMITATIONS: meal prep, cleaning, laundry, shopping, community activity, and yard work  PERSONAL FACTORS: Time since onset of injury/illness/exacerbation are also affecting patient's functional outcome.   REHAB POTENTIAL: Good  CLINICAL DECISION MAKING: Evolving/moderate complexity  EVALUATION COMPLEXITY: Moderate   GOALS: Goals reviewed with patient? Yes  SHORT TERM GOALS: Target date: 12/05/22  Pt will tolerate full aquatic sessions consistently without increase in pain and with improving function to demonstrate good toleration and effectiveness of intervention.   Baseline: Goal status:  INITIAL  2.  Pt will improve on 5 X STS test to <or= 20s to demonstrate improving functional lower extremity strength, transitional movements, and balance  Baseline: 32.41 Goal status: INITIAL  3.  Pt will decide on long term benefit of aquatic therapy intervention and gain access to pool if advantageous. Baseline: uncertain of benefit Goal status: INITIAL  4.  Pt will have decreased P! With lumbar ROM. Baseline: See chart Goal status: INITIAL    LONG TERM GOALS: Target date: 01/02/23  Pt to meet stated Foto Goal 54% Baseline: 45% Goal status: INITIAL  2.  Pt will be indep with final HEP's (land and aquatic as appropriate) for continued management of condition  Baseline:  Goal status: INITIAL  3.  Pt will report turning in bed without increase in pain Baseline: sup<>sit and turning painful Goal status: INITIAL  4.  Pt will be able to walk 1 mile to  demonstrate improved mobility/personal goal Baseline: 450ft Goal status: INITIAL  5.  Pt to report less fatigue being able tolerate daily activity without being "done"  until 500 pm. Baseline: 300p Goal status: INITIAL    PLAN:  PT FREQUENCY: 1-2x/week  PT DURATION: 8 weeks 12 visits (first 4 aquatic then alternating)  PLANNED INTERVENTIONS: Therapeutic exercises, Therapeutic activity, Neuromuscular re-education, Balance training, Gait training, Patient/Family education, Self Care, Joint mobilization, Stair training, Orthotic/Fit training, DME instructions, Aquatic Therapy, Dry Needling, Electrical stimulation, Cryotherapy, Moist heat, Splintting, Taping, Biofeedback, Ionotophoresis 4mg /ml Dexamethasone, Manual therapy, and Re-evaluation.  PLAN FOR NEXT SESSION: aquatic and land based: Instruction on strengthening, stretching and balance exercises, establish HEP, pt edu on management of chronic conditions   Rushie Chestnut) Carol Theys MPT 11/27/22 4:23 PM [facility] [address] [place], [place], [address] Phone: [phone]   Fax:  [phone]   Referring diagnosis? M51.36 (ICD-10-CM) - DDD (degenerative disc disease), lumbar  Treatment diagnosis? (if different than referring diagnosis) same What was this (referring dx) caused by? []  Surgery []  Fall [x]  Ongoing issue [x]  Arthritis []  Other: ____________  Laterality: []  Rt []  Lt [x]  Both  Check all possible CPT codes:  *CHOOSE 10 OR LESS*    [x]  97110 (Therapeutic Exercise)  []  92507 (SLP Treatment)  [x]  97112 (Neuro Re-ed)   []  92526 (Swallowing Treatment)   [x]  97116 (Gait Training)   []  [id number] (Cognitive Training, 1st 15 minutes) [x]  97140 (Manual Therapy)   []  97130 (Cognitive Training, each add'l 15 minutes)  [x]  97164 (Re-evaluation)                              []  Other, List CPT Code ____________  [x]  97530 (Therapeutic Activities)     [x]  97535 (Self  Care)   [x]  All codes above (97110 - 97535)  []  97012 (Mechanical Traction)  []  97014 (E-stim Unattended)  []  97032 (E-stim manual)  []  97033 (Ionto)  []  97035 (Ultrasound) []  97750 (Physical Performance Training) [x]  [id number] (Aquatic Therapy) []  97016 (Vasopneumatic Device) []  65784 (Paraffin) []  97034 (Contrast Bath) []  97597 (Wound Care 1st 20 sq cm) []  97598 (Wound Care each add'l 20 sq cm) []  97760 (Orthotic Fabrication, Fitting, Training Initial) []  [id number] (Prosthetic Management and Training Initial) []  [id number] (Orthotic or Prosthetic Training/ Modification Subsequent)

## 2022-12-01 ENCOUNTER — Ambulatory Visit (HOSPITAL_BASED_OUTPATIENT_CLINIC_OR_DEPARTMENT_OTHER): Payer: Medicare HMO | Attending: Rheumatology | Admitting: Physical Therapy

## 2022-12-01 ENCOUNTER — Encounter (HOSPITAL_BASED_OUTPATIENT_CLINIC_OR_DEPARTMENT_OTHER): Payer: Self-pay | Admitting: Physical Therapy

## 2022-12-01 DIAGNOSIS — M797 Fibromyalgia: Secondary | ICD-10-CM | POA: Insufficient documentation

## 2022-12-01 DIAGNOSIS — M5459 Other low back pain: Secondary | ICD-10-CM | POA: Diagnosis present

## 2022-12-01 DIAGNOSIS — M6281 Muscle weakness (generalized): Secondary | ICD-10-CM | POA: Diagnosis present

## 2022-12-01 NOTE — Therapy (Signed)
OUTPATIENT PHYSICAL THERAPY THORACOLUMBAR EVALUATION   Patient Name: Whitney Ward MRN: [id number] DOB:Jun 26, 1951, 72 y.o., female Today's Date: 12/01/2022  END OF SESSION:  PT End of Session - 12/01/22 0940     Visit Number 4    Number of Visits 12    Date for PT Re-Evaluation 01/02/23    Authorization Type [employer] m[employer]    Progress Note Due on Visit 10    PT Start Time 0931    PT Stop Time 1009    PT Time Calculation (min) 38 min    Activity Tolerance Patient tolerated treatment well    Behavior During Therapy WFL for tasks assessed/performed               Past Medical History:  Diagnosis Date   Arthritis    Breast cancer (HCC)    Breast cancer (HCC)    Broken toe    CAD (coronary artery disease)    Fibromyalgia    Hypertension    MI (myocardial infarction) (HCC)    Past Surgical History:  Procedure Laterality Date   ABDOMINAL HYSTERECTOMY     BREAST SURGERY     CESAREAN SECTION     ingrown toenail  03/21/2021   SMALL INTESTINE SURGERY     TONSILLECTOMY     Patient Active Problem List   Diagnosis Date Noted   Fibromyalgia 10/09/2016   Primary insomnia 10/09/2016   Other fatigue 10/09/2016   Tendinopathy of right shoulder 10/09/2016   Trochanteric bursitis of right hip 10/09/2016   Primary osteoarthritis of both hands 10/09/2016   Chronic kidney disease 10/09/2016   Essential hypertension 10/09/2016   Gastroesophageal reflux disease 10/09/2016   Restless leg syndrome 10/09/2016   Malignant neoplasm of female breast (HCC) 10/09/2016   History of breast cancer 10/09/2016   Genital herpes simplex 10/09/2016    PCP: Elinor Dodge, MD   REFERRING PROVIDER: [name], MD   REFERRING DIAG: M51.36 (ICD-10-CM) - DDD (degenerative disc disease), lumbar   Rationale for Evaluation and Treatment: Rehabilitation  THERAPY DIAG:  Other low back pain  Muscle weakness (generalized)  Fibromyalgia  ONSET DATE: >10 yr  SUBJECTIVE:                                                                                                                                                                                            SUBJECTIVE STATEMENT: Pt  states that she is sore after the pool sessions for about a day. It does not totally go away but it does improve. Pt has not returned to the gym as of yet.   PERTINENT HISTORY:  a  72 y.o. female with history of osteoarthritis, fibromyalgia and trochanteric bursitis   PAIN:  Are you having pain? Yes: NPRS scale: current 3/10 Pain location: hips to legs towards ankle Pain description: dull ache, in bed achy Aggravating factors: lying down, walking > 5 minutes,  Relieving factors: gabapentin, OTC meds, resting in recliner.  PRECAUTIONS: Fall  WEIGHT BEARING RESTRICTIONS: No  FALLS:  Has patient fallen in last 6 months? No  LIVING ENVIRONMENT: Lives with: lives with their family and lives with their spouse Lives in: House/apartment Stairs: Yes: Internal: 16 steps; can reach both Has following equipment at home: None  OCCUPATION: Retired Therapist, music  PLOF: Independent  PATIENT GOALS: Be able to turn in bed with less pain, increase mobility, decrease fatigue    OBJECTIVE:   DIAGNOSTIC FINDINGS:  Degenerative disc disease, lumbar - X-ray in 02/2021 revealed arthritic changes; scoliosis of the lumbar spine   PATIENT SURVEYS:  FOTO primary score 45% with goal of 54%  SCREENING FOR RED FLAGS:   WFL  COGNITION: Overall cognitive status: Within functional limits for tasks assessed     SENSATION: WFL  MUSCLE LENGTH: Hamstrings: Right 70 deg; Left 80 deg   POSTURE: No Significant postural limitations  PALPATION: TTP bilat hip bursas  LUMBAR ROM:   AROM eval  Flexion Below patella P!  Extension wfl  Right lateral flexion Wfl P!  Left lateral flexion Wfl P!  Right rotation   Left rotation    (Blank rows = not tested)  LOWER EXTREMITY ROM:      wfl  LOWER EXTREMITY MMT:    MMT Right eval Left eval  Hip flexion 4 P! 4P!  Hip extension    Hip abduction 4P! 4P!  Hip adduction    Hip internal rotation    Hip external rotation    Knee flexion 5 5  Knee extension    Ankle dorsiflexion    Ankle plantarflexion    Ankle inversion    Ankle eversion     (Blank rows = not tested)  LUMBAR SPECIAL TESTS:  Straight leg raise test: Negative, Slump test: Negative, and Thomas test: Negative  FUNCTIONAL TESTS:  5 times sit to stand: from pool bench 32.41 Timed up and go (TUG): 10.58 4 stage balance test: wfl  GAIT: Distance walked: 200 Assistive device utilized: None Level of assistance: Complete Independence Comments: wfl  TODAY'S TREATMENT:            6/3  Nustep warm up Lvl 6 5 min  STS from midheight table RTB at knees 3x8 Bridge RTB at knees 3x8 Figure 4 stretch 30s 3x each pressing down Step up 6" 2x10 each shuttle Leg press 3x8 100lbs Jefferson curl/segmental flexion 3x5   Previous:                                                                                                                    Pt seen for aquatic therapy today.  Treatment took place in water 3.5-4.75 ft in depth at  the [facility] pool. Temp of water was 91.  Pt entered/exited the pool via stairs independently with bilat rail.  *unsupported:  walking forward/ backward  * without support: side stepping L/R; walking forward/backward *UE support yellow HB: toe raises; heel raises x10-12; SLS with 3 way toe tap x 5 each; leg swings into hip flex/ ext 8 (difficult) *core engagement: yellow HB carry forward and back then side ways   - TrA set with solid noodle wide stance then staggered in 4.16ft x 10 ea *seated on bench: hip add/abd, slr/flutter kicking; LAQ; cycling *STS at bench with cues for TrA set, forward arm reach, hip hinge x 10 *hip hinge x 8 in standing *Assisted vertical suspension for decompression.  Pt with difficulty  maintaining balance in position.   Pt requires the buoyancy and hydrostatic pressure of water for support, and to offload joints by unweighting joint load by at least 50 % in navel deep water and by at least 75-80% in chest to neck deep water.  Viscosity of the water is needed for resistance of strengthening. Water current perturbations provides challenge to standing balance requiring increased core activation.     PATIENT EDUCATION:  Education details:  anatomy, exercise progression, DOMS expectations, HEP, POC Person educated: Patient Education method: Explanation, Handout Education comprehension: verbalized understanding  HOME EXERCISE PROGRAM: TBA  ASSESSMENT:  CLINICAL IMPRESSION: Pt with very good tolerance to land based session with no increase in pain from entry. Pain kept <3/10 with all exercise. Pt was able to do isometrically loaded hip ABD exercise in CKC in DL and SL. Pt does require VC and TC for LE alignment and abdominal/hip coordination with movement. Pt was able to load across bilateral hip joints and SIJ today with BW resistance. Plan to continue with progression of land based lumbeopelvic program. Consider RDL/deadlift movement at next session and continue with loading across the L/S and SIJ. Pt would benefit from continued skilled therapy in order to reach goals and maximize functional lumbopelvic strength and ROM for return to exercise and self pain management.    From eval:  Patient is a 72 y.o. f who was seen today for physical therapy evaluation and treatment for DDD Lumbar spine. She does not report any radicular symptoms nor are any of the special tests positive for SIJ dysfunction or sciatica.  Her main complaint is her fibromyalgia limiting symptoms of fatigue and pain from waist to feet. She does have chronic hip bursitis bilaterally and oa knees. She has had multiple episodes of physical therapy in the past. Pt edu on benefits of aquatic warm water therapy with  fibromyalgia and vu of the importance of continuing with an aquatic HEP after DC for benefits to continue. She is interested in returning to land based therapy for an updated HEP in which she can complete in event she does not gain as access to a pool or as an addition to.  She is a good candidate for skilled physical therapy both aquatic and land based to improve strength, endurance and toleration to activity improving her QOL and improving her management of her chronic conditions. Pt reports she is not afraid of water but she does not swim.    OBJECTIVE IMPAIRMENTS: decreased activity tolerance, decreased endurance, decreased mobility, decreased strength, and pain.   ACTIVITY LIMITATIONS: lifting, bending, standing, squatting, stairs, transfers, and bed mobility  PARTICIPATION LIMITATIONS: meal prep, cleaning, laundry, shopping, community activity, and yard work  PERSONAL FACTORS: Time since onset of injury/illness/exacerbation are also  affecting patient's functional outcome.   REHAB POTENTIAL: Good  CLINICAL DECISION MAKING: Evolving/moderate complexity  EVALUATION COMPLEXITY: Moderate   GOALS: Goals reviewed with patient? Yes  SHORT TERM GOALS: Target date: 12/05/22  Pt will tolerate full aquatic sessions consistently without increase in pain and with improving function to demonstrate good toleration and effectiveness of intervention.   Baseline: Goal status: INITIAL  2.  Pt will improve on 5 X STS test to <or= 20s to demonstrate improving functional lower extremity strength, transitional movements, and balance  Baseline: 32.41 Goal status: INITIAL  3.  Pt will decide on long term benefit of aquatic therapy intervention and gain access to pool if advantageous. Baseline: uncertain of benefit Goal status: INITIAL  4.  Pt will have decreased P! With lumbar ROM. Baseline: See chart Goal status: INITIAL    LONG TERM GOALS: Target date: 01/02/23  Pt to meet stated Foto Goal  54% Baseline: 45% Goal status: INITIAL  2.  Pt will be indep with final HEP's (land and aquatic as appropriate) for continued management of condition  Baseline:  Goal status: INITIAL  3.  Pt will report turning in bed without increase in pain Baseline: sup<>sit and turning painful Goal status: INITIAL  4.  Pt will be able to walk 1 mile to demonstrate improved mobility/personal goal Baseline: 417ft Goal status: INITIAL  5.  Pt to report less fatigue being able tolerate daily activity without being "done"  until 500 pm. Baseline: 300p Goal status: INITIAL    PLAN:  PT FREQUENCY: 1-2x/week  PT DURATION: 8 weeks 12 visits (first 4 aquatic then alternating)  PLANNED INTERVENTIONS: Therapeutic exercises, Therapeutic activity, Neuromuscular re-education, Balance training, Gait training, Patient/Family education, Self Care, Joint mobilization, Stair training, Orthotic/Fit training, DME instructions, Aquatic Therapy, Dry Needling, Electrical stimulation, Cryotherapy, Moist heat, Splintting, Taping, Biofeedback, Ionotophoresis 4mg /ml Dexamethasone, Manual therapy, and Re-evaluation.  PLAN FOR NEXT SESSION: aquatic and land based: Instruction on strengthening, stretching and balance exercises, establish HEP, pt edu on management of chronic conditions   Rushie Chestnut) Ziemba MPT 12/01/22 10:13 AM [facility] [address] [place], [place], [address] Phone: [phone]   Fax:  [phone]   Referring diagnosis? M51.36 (ICD-10-CM) - DDD (degenerative disc disease), lumbar  Treatment diagnosis? (if different than referring diagnosis) same What was this (referring dx) caused by? []  Surgery []  Fall [x]  Ongoing issue [x]  Arthritis []  Other: ____________  Laterality: []  Rt []  Lt [x]  Both  Check all possible CPT codes:  *CHOOSE 10 OR LESS*    [x]  97110 (Therapeutic Exercise)  []  92507 (SLP Treatment)  [x]  97112 (Neuro  Re-ed)   []  92526 (Swallowing Treatment)   [x]  97116 (Gait Training)   []  [id number] (Cognitive Training, 1st 15 minutes) [x]  97140 (Manual Therapy)   []  97130 (Cognitive Training, each add'l 15 minutes)  [x]  97164 (Re-evaluation)                              []  Other, List CPT Code ____________  [x]  97530 (Therapeutic Activities)     [x]  97535 (Self Care)   [x]  All codes above (97110 - 97535)  []  97012 (Mechanical Traction)  []  97014 (E-stim Unattended)  []  97032 (E-stim manual)  []  97033 (Ionto)  []  97035 (Ultrasound) []  97750 (Physical Performance Training) [x]  [id number] (Aquatic Therapy) []  97016 (Vasopneumatic Device) []  [id number] (Paraffin) []  97034 (Contrast Bath) []  65784 (Wound Care 1st 20 sq cm) []  97598 (  Wound Care each add'l 20 sq cm) []  97760 (Orthotic Fabrication, Fitting, Training Initial) []  81191 (Prosthetic Management and Training Initial) []  [phone] (Orthotic or Prosthetic Training/ Modification Subsequent)

## 2022-12-04 ENCOUNTER — Ambulatory Visit (HOSPITAL_BASED_OUTPATIENT_CLINIC_OR_DEPARTMENT_OTHER): Payer: Medicare HMO | Attending: Rheumatology | Admitting: Physical Therapy

## 2022-12-04 ENCOUNTER — Encounter (HOSPITAL_BASED_OUTPATIENT_CLINIC_OR_DEPARTMENT_OTHER): Payer: Self-pay | Admitting: Physical Therapy

## 2022-12-04 DIAGNOSIS — M6281 Muscle weakness (generalized): Secondary | ICD-10-CM | POA: Insufficient documentation

## 2022-12-04 DIAGNOSIS — M5459 Other low back pain: Secondary | ICD-10-CM | POA: Diagnosis present

## 2022-12-04 DIAGNOSIS — M797 Fibromyalgia: Secondary | ICD-10-CM | POA: Diagnosis present

## 2022-12-04 NOTE — Therapy (Signed)
OUTPATIENT PHYSICAL THERAPY THORACOLUMBAR EVALUATION   Patient Name: Whitney Ward MRN: [id number] DOB:08/09/1950, 72 y.o., female Today's Date: 12/04/2022  END OF SESSION:  PT End of Session - 12/04/22 1403     Visit Number 5    Number of Visits 12    Date for PT Re-Evaluation 01/02/23    Authorization Type [employer] mcr    Progress Note Due on Visit 10    PT Start Time 1402    PT Stop Time 1445    PT Time Calculation (min) 43 min    Activity Tolerance Patient tolerated treatment well    Behavior During Therapy WFL for tasks assessed/performed               Past Medical History:  Diagnosis Date   Arthritis    Breast cancer (HCC)    Breast cancer (HCC)    Broken toe    CAD (coronary artery disease)    Fibromyalgia    Hypertension    MI (myocardial infarction) (HCC)    Past Surgical History:  Procedure Laterality Date   ABDOMINAL HYSTERECTOMY     BREAST SURGERY     CESAREAN SECTION     ingrown toenail  03/21/2021   SMALL INTESTINE SURGERY     TONSILLECTOMY     Patient Active Problem List   Diagnosis Date Noted   Fibromyalgia 10/09/2016   Primary insomnia 10/09/2016   Other fatigue 10/09/2016   Tendinopathy of right shoulder 10/09/2016   Trochanteric bursitis of right hip 10/09/2016   Primary osteoarthritis of both hands 10/09/2016   Chronic kidney disease 10/09/2016   Essential hypertension 10/09/2016   Gastroesophageal reflux disease 10/09/2016   Restless leg syndrome 10/09/2016   Malignant neoplasm of female breast (HCC) 10/09/2016   History of breast cancer 10/09/2016   Genital herpes simplex 10/09/2016    PCP: Elinor Dodge, MD   REFERRING PROVIDER: [name], MD   REFERRING DIAG: M51.36 (ICD-10-CM) - DDD (degenerative disc disease), lumbar   Rationale for Evaluation and Treatment: Rehabilitation  THERAPY DIAG:  Other low back pain  Muscle weakness (generalized)  Fibromyalgia  ONSET DATE: >10 yr  SUBJECTIVE:                                                                                                                                                                                            SUBJECTIVE STATEMENT: Pt  states that she is sore after last land session for a few hours then felt better  PERTINENT HISTORY:  a 72 y.o. female with history of osteoarthritis, fibromyalgia and trochanteric bursitis   PAIN:  Are you having  pain? Yes: NPRS scale: current 3/10 Pain location: hips to legs towards ankle Pain description: dull ache, in bed achy Aggravating factors: lying down, walking > 5 minutes,  Relieving factors: gabapentin, OTC meds, resting in recliner.  PRECAUTIONS: Fall  WEIGHT BEARING RESTRICTIONS: No  FALLS:  Has patient fallen in last 6 months? No  LIVING ENVIRONMENT: Lives with: lives with their family and lives with their spouse Lives in: House/apartment Stairs: Yes: Internal: 16 steps; can reach both Has following equipment at home: None  OCCUPATION: Retired Therapist, music  PLOF: Independent  PATIENT GOALS: Be able to turn in bed with less pain, increase mobility, decrease fatigue    OBJECTIVE:   DIAGNOSTIC FINDINGS:  Degenerative disc disease, lumbar - X-ray in 02/2021 revealed arthritic changes; scoliosis of the lumbar spine   PATIENT SURVEYS:  FOTO primary score 45% with goal of 54%  SCREENING FOR RED FLAGS:   WFL  COGNITION: Overall cognitive status: Within functional limits for tasks assessed     SENSATION: WFL  MUSCLE LENGTH: Hamstrings: Right 70 deg; Left 80 deg   POSTURE: No Significant postural limitations  PALPATION: TTP bilat hip bursas  LUMBAR ROM:   AROM eval  Flexion Below patella P!  Extension wfl  Right lateral flexion Wfl P!  Left lateral flexion Wfl P!  Right rotation   Left rotation    (Blank rows = not tested)  LOWER EXTREMITY ROM:     wfl  LOWER EXTREMITY MMT:    MMT Right eval Left eval  Hip flexion 4 P!  4P!  Hip extension    Hip abduction 4P! 4P!  Hip adduction    Hip internal rotation    Hip external rotation    Knee flexion 5 5  Knee extension    Ankle dorsiflexion    Ankle plantarflexion    Ankle inversion    Ankle eversion     (Blank rows = not tested)  LUMBAR SPECIAL TESTS:  Straight leg raise test: Negative, Slump test: Negative, and Thomas test: Negative  FUNCTIONAL TESTS:  5 times sit to stand: from pool bench 32.41 Timed up and go (TUG): 10.58 4 stage balance test: wfl  GAIT: Distance walked: 200 Assistive device utilized: None Level of assistance: Complete Independence Comments: wfl  TODAY'S TREATMENT:            12/04/22  Pt seen for aquatic therapy today.  Treatment took place in water 3.5-4.75 ft in depth at the [facility] pool. Temp of water was 91.  Pt entered/exited the pool via stairs independently with bilat rail.  *unsupported:  walking forward/ backward  *core engagement: yellow HB carry forward and back then side ways *side lunges shoulder add/abd yellow hab x 4 widths *resisted side stepping with lt blue band x 6 widths *seated clams lt blue band 2x10 *STS resistance band at knees x 10 *hip hinge 2 x 10 *TrA set with solid noodle->thick yellow noodle wide stance then staggered in 4.66ft x 10 ea *Vertical suspension for decompression.   *KB row le staggered x 15   Pt requires the buoyancy and hydrostatic pressure of water for support, and to offload joints by unweighting joint load by at least 50 % in navel deep water and by at least 75-80% in chest to neck deep water.  Viscosity of the water is needed for resistance of strengthening. Water current perturbations provides challenge to standing balance requiring increased core activation.    Previous:  Nustep warm up Lvl 6 5 min  STS from midheight table RTB at knees 3x8 Bridge RTB at knees 3x8 Figure 4 stretch 30s 3x each pressing down Step up 6" 2x10 each shuttle Leg press 3x8  100lbs Jefferson curl/segmental flexion 3x5                                                                                                                           PATIENT EDUCATION:  Education details:  anatomy, exercise progression, DOMS expectations, HEP, POC Person educated: Patient Education method: Explanation, Handout Education comprehension: verbalized understanding  HOME EXERCISE PROGRAM: TBA  ASSESSMENT:  CLINICAL IMPRESSION: Increased resistance tolerated well bilat hip abd. Did report some discomfort with isomerically loaded STS.  She tolerates increased resistance with core TrA. Pt would benefit from continued skilled therapy in order to reach goals and maximize functional lumbopelvic strength and ROM for return to exercise and self pain management.     From eval:  Patient is a 72 y.o. f who was seen today for physical therapy evaluation and treatment for DDD Lumbar spine. She does not report any radicular symptoms nor are any of the special tests positive for SIJ dysfunction or sciatica.  Her main complaint is her fibromyalgia limiting symptoms of fatigue and pain from waist to feet. She does have chronic hip bursitis bilaterally and oa knees. She has had multiple episodes of physical therapy in the past. Pt edu on benefits of aquatic warm water therapy with fibromyalgia and vu of the importance of continuing with an aquatic HEP after DC for benefits to continue. She is interested in returning to land based therapy for an updated HEP in which she can complete in event she does not gain as access to a pool or as an addition to.  She is a good candidate for skilled physical therapy both aquatic and land based to improve strength, endurance and toleration to activity improving her QOL and improving her management of her chronic conditions. Pt reports she is not afraid of water but she does not swim.    OBJECTIVE IMPAIRMENTS: decreased activity tolerance, decreased endurance,  decreased mobility, decreased strength, and pain.   ACTIVITY LIMITATIONS: lifting, bending, standing, squatting, stairs, transfers, and bed mobility  PARTICIPATION LIMITATIONS: meal prep, cleaning, laundry, shopping, community activity, and yard work  PERSONAL FACTORS: Time since onset of injury/illness/exacerbation are also affecting patient's functional outcome.   REHAB POTENTIAL: Good  CLINICAL DECISION MAKING: Evolving/moderate complexity  EVALUATION COMPLEXITY: Moderate   GOALS: Goals reviewed with patient? Yes  SHORT TERM GOALS: Target date: 12/05/22  Pt will tolerate full aquatic sessions consistently without increase in pain and with improving function to demonstrate good toleration and effectiveness of intervention.   Baseline: Goal status: INITIAL  2.  Pt will improve on 5 X STS test to <or= 20s to demonstrate improving functional lower extremity strength, transitional movements, and balance  Baseline: 32.41 Goal status: INITIAL  3.  Pt will decide on long term benefit of aquatic therapy intervention and gain  access to pool if advantageous. Baseline: uncertain of benefit Goal status: INITIAL  4.  Pt will have decreased P! With lumbar ROM. Baseline: See chart Goal status: INITIAL    LONG TERM GOALS: Target date: 01/02/23  Pt to meet stated Foto Goal 54% Baseline: 45% Goal status: INITIAL  2.  Pt will be indep with final HEP's (land and aquatic as appropriate) for continued management of condition  Baseline:  Goal status: INITIAL  3.  Pt will report turning in bed without increase in pain Baseline: sup<>sit and turning painful Goal status: INITIAL  4.  Pt will be able to walk 1 mile to demonstrate improved mobility/personal goal Baseline: 489ft Goal status: INITIAL  5.  Pt to report less fatigue being able tolerate daily activity without being "done"  until 500 pm. Baseline: 300p Goal status: INITIAL    PLAN:  PT FREQUENCY: 1-2x/week  PT  DURATION: 8 weeks 12 visits (first 4 aquatic then alternating)  PLANNED INTERVENTIONS: Therapeutic exercises, Therapeutic activity, Neuromuscular re-education, Balance training, Gait training, Patient/Family education, Self Care, Joint mobilization, Stair training, Orthotic/Fit training, DME instructions, Aquatic Therapy, Dry Needling, Electrical stimulation, Cryotherapy, Moist heat, Splintting, Taping, Biofeedback, Ionotophoresis 4mg /ml Dexamethasone, Manual therapy, and Re-evaluation.  PLAN FOR NEXT SESSION: aquatic and land based: Instruction on strengthening, stretching and balance exercises, establish HEP, pt edu on management of chronic conditions   Rushie Chestnut) Devontae Casasola MPT 12/04/22 2:04 PM [facility] [address] [place], [place], [address] Phone: [phone]   Fax:  [phone]   Referring diagnosis? M51.36 (ICD-10-CM) - DDD (degenerative disc disease), lumbar  Treatment diagnosis? (if different than referring diagnosis) same What was this (referring dx) caused by? []  Surgery []  Fall [x]  Ongoing issue [x]  Arthritis []  Other: ____________  Laterality: []  Rt []  Lt [x]  Both  Check all possible CPT codes:  *CHOOSE 10 OR LESS*    [x]  97110 (Therapeutic Exercise)  []  92507 (SLP Treatment)  [x]  97112 (Neuro Re-ed)   []  92526 (Swallowing Treatment)   [x]  97116 (Gait Training)   []  [id number] (Cognitive Training, 1st 15 minutes) [x]  97140 (Manual Therapy)   []  97130 (Cognitive Training, each add'l 15 minutes)  [x]  97164 (Re-evaluation)                              []  Other, List CPT Code ____________  [x]  97530 (Therapeutic Activities)     [x]  97535 (Self Care)   [x]  All codes above (97110 - 97535)  []  97012 (Mechanical Traction)  []  97014 (E-stim Unattended)  []  97032 (E-stim manual)  []  97033 (Ionto)  []  97035 (Ultrasound) []  97750 (Physical Performance Training) [x]  [id number] (Aquatic Therapy) []  97016 (Vasopneumatic  Device) []  [id number] (Paraffin) []  97034 (Contrast Bath) []  97597 (Wound Care 1st 20 sq cm) []  97598 (Wound Care each add'l 20 sq cm) []  97760 (Orthotic Fabrication, Fitting, Training Initial) []  [id number] (Prosthetic Management and Training Initial) []  [id number] (Orthotic or Prosthetic Training/ Modification Subsequent)

## 2022-12-08 ENCOUNTER — Encounter (HOSPITAL_BASED_OUTPATIENT_CLINIC_OR_DEPARTMENT_OTHER): Payer: Self-pay | Admitting: Physical Therapy

## 2022-12-08 ENCOUNTER — Ambulatory Visit (HOSPITAL_BASED_OUTPATIENT_CLINIC_OR_DEPARTMENT_OTHER): Payer: Medicare HMO | Admitting: Physical Therapy

## 2022-12-08 DIAGNOSIS — M5459 Other low back pain: Secondary | ICD-10-CM | POA: Diagnosis not present

## 2022-12-08 DIAGNOSIS — M797 Fibromyalgia: Secondary | ICD-10-CM

## 2022-12-08 DIAGNOSIS — M6281 Muscle weakness (generalized): Secondary | ICD-10-CM

## 2022-12-08 NOTE — Therapy (Signed)
OUTPATIENT PHYSICAL THERAPY THORACOLUMBAR EVALUATION   Patient Name: Whitney Ward MRN: [id number] DOB:August 28, 1950, 72 y.o., female Today's Date: 12/08/2022  END OF SESSION:  PT End of Session - 12/08/22 1400     Visit Number 6    Number of Visits 12    Date for PT Re-Evaluation 01/02/23    Authorization Type [employer] mcr    Progress Note Due on Visit 10    PT Start Time 1400    PT Stop Time 1440    PT Time Calculation (min) 40 min    Activity Tolerance Patient tolerated treatment well    Behavior During Therapy WFL for tasks assessed/performed               Past Medical History:  Diagnosis Date   Arthritis    Breast cancer (HCC)    Breast cancer (HCC)    Broken toe    CAD (coronary artery disease)    Fibromyalgia    Hypertension    MI (myocardial infarction) (HCC)    Past Surgical History:  Procedure Laterality Date   ABDOMINAL HYSTERECTOMY     BREAST SURGERY     CESAREAN SECTION     ingrown toenail  03/21/2021   SMALL INTESTINE SURGERY     TONSILLECTOMY     Patient Active Problem List   Diagnosis Date Noted   Fibromyalgia 10/09/2016   Primary insomnia 10/09/2016   Other fatigue 10/09/2016   Tendinopathy of right shoulder 10/09/2016   Trochanteric bursitis of right hip 10/09/2016   Primary osteoarthritis of both hands 10/09/2016   Chronic kidney disease 10/09/2016   Essential hypertension 10/09/2016   Gastroesophageal reflux disease 10/09/2016   Restless leg syndrome 10/09/2016   Malignant neoplasm of female breast (HCC) 10/09/2016   History of breast cancer 10/09/2016   Genital herpes simplex 10/09/2016    PCP: Elinor Dodge, MD   REFERRING PROVIDER: [name], MD   REFERRING DIAG: M51.36 (ICD-10-CM) - DDD (degenerative disc disease), lumbar   Rationale for Evaluation and Treatment: Rehabilitation  THERAPY DIAG:  Other low back pain  Muscle weakness (generalized)  Fibromyalgia  ONSET DATE: >10 yr  SUBJECTIVE:                                                                                                                                                                                            SUBJECTIVE STATEMENT: Pt states the that she was sore after last session. No issues otherwise. Pt feels her pain is better today on land.   PERTINENT HISTORY:  a 72 y.o. female with history of osteoarthritis, fibromyalgia and trochanteric bursitis  PAIN:  Are you having pain? Yes: NPRS scale: 2/10 Pain location: hips to legs towards ankle Pain description: dull ache, in bed achy Aggravating factors: lying down, walking > 5 minutes,  Relieving factors: gabapentin, OTC meds, resting in recliner.  PRECAUTIONS: Fall  WEIGHT BEARING RESTRICTIONS: No  FALLS:  Has patient fallen in last 6 months? No  LIVING ENVIRONMENT: Lives with: lives with their family and lives with their spouse Lives in: House/apartment Stairs: Yes: Internal: 16 steps; can reach both Has following equipment at home: None  OCCUPATION: Retired Therapist, music  PLOF: Independent  PATIENT GOALS: Be able to turn in bed with less pain, increase mobility, decrease fatigue    OBJECTIVE:   DIAGNOSTIC FINDINGS:  Degenerative disc disease, lumbar - X-ray in 02/2021 revealed arthritic changes; scoliosis of the lumbar spine   PATIENT SURVEYS:  FOTO primary score 45% with goal of 54%  SCREENING FOR RED FLAGS:   WFL  COGNITION: Overall cognitive status: Within functional limits for tasks assessed     SENSATION: WFL  MUSCLE LENGTH: Hamstrings: Right 70 deg; Left 80 deg   POSTURE: No Significant postural limitations  PALPATION: TTP bilat hip bursas  LUMBAR ROM:   AROM eval  Flexion Below patella P!  Extension wfl  Right lateral flexion Wfl P!  Left lateral flexion Wfl P!  Right rotation   Left rotation    (Blank rows = not tested)  LOWER EXTREMITY ROM:     wfl  LOWER EXTREMITY MMT:    MMT Right eval  Left eval  Hip flexion 4 P! 4P!  Hip extension    Hip abduction 4P! 4P!  Hip adduction    Hip internal rotation    Hip external rotation    Knee flexion 5 5  Knee extension    Ankle dorsiflexion    Ankle plantarflexion    Ankle inversion    Ankle eversion     (Blank rows = not tested)  LUMBAR SPECIAL TESTS:  Straight leg raise test: Negative, Slump test: Negative, and Thomas test: Negative  FUNCTIONAL TESTS:  5 times sit to stand: from pool bench 32.41 Timed up and go (TUG): 10.58 4 stage balance test: wfl  GAIT: Distance walked: 200 Assistive device utilized: None Level of assistance: Complete Independence Comments: wfl  TODAY'S TREATMENT:     6/10 Nustep warm up Lvl 6 6 min  STS from midheight table RTB at knees 3x8 Foam roller RDL 10x; 15lb KB RDL 3x8 at wall Figure 4 stretch 30s 3x each pressing down shuttle Leg press 3x8 75lbs (single leg) S/L shuttle leg press 50lbs 2x10                                                                        12/04/22  Pt seen for aquatic therapy today.  Treatment took place in water 3.5-4.75 ft in depth at the [facility] pool. Temp of water was 91.  Pt entered/exited the pool via stairs independently with bilat rail.  *unsupported:  walking forward/ backward  *core engagement: yellow HB carry forward and back then side ways *side lunges shoulder add/abd yellow hab x 4 widths *resisted side stepping with lt blue band x 6 widths *seated clams lt blue band 2x10 *  STS resistance band at knees x 10 *hip hinge 2 x 10 *TrA set with solid noodle->thick yellow noodle wide stance then staggered in 4.69ft x 10 ea *Vertical suspension for decompression.   *KB row le staggered x 15   Pt requires the buoyancy and hydrostatic pressure of water for support, and to offload joints by unweighting joint load by at least 50 % in navel deep water and by at least 75-80% in chest to neck deep water.  Viscosity of the water is needed for  resistance of strengthening. Water current perturbations provides challenge to standing balance requiring increased core activation.    Previous:  Nustep warm up Lvl 6 5 min  STS from midheight table RTB at knees 3x8 Bridge RTB at knees 3x8 Figure 4 stretch 30s 3x each pressing down Step up 6" 2x10 each shuttle Leg press 3x8 100lbs Jefferson curl/segmental flexion 3x5                                                                                                                           PATIENT EDUCATION:  Education details:  anatomy, exercise progression, DOMS expectations, HEP, POC Person educated: Patient Education method: Explanation, Handout Education comprehension: verbalized understanding  HOME EXERCISE PROGRAM: Access Code: [email or web address] URL: [email or web address] Date: 12/08/2022 Prepared by: Zebedee Iba  ASSESSMENT:  CLINICAL IMPRESSION: Pt able to progress SL loading across bilateral SIJ and glutes without increase in pain. Pt able to progress to SL loaded activity, hip hinging, and lifting without pain today. HEP updated with BW exercise and light RDL for now. Plan to progress and continue with gym based loading as well as more CKC exercise for home if no increase in pain form today's session. Consider more gym based exercise as tolerated. Pt would benefit from continued skilled therapy in order to reach goals and maximize functional lumbopelvic strength and ROM for return to exercise and self pain management.     From eval:  Patient is a 72 y.o. f who was seen today for physical therapy evaluation and treatment for DDD Lumbar spine. She does not report any radicular symptoms nor are any of the special tests positive for SIJ dysfunction or sciatica.  Her main complaint is her fibromyalgia limiting symptoms of fatigue and pain from waist to feet. She does have chronic hip bursitis bilaterally and oa knees. She has had multiple episodes of physical therapy  in the past. Pt edu on benefits of aquatic warm water therapy with fibromyalgia and vu of the importance of continuing with an aquatic HEP after DC for benefits to continue. She is interested in returning to land based therapy for an updated HEP in which she can complete in event she does not gain as access to a pool or as an addition to.  She is a good candidate for skilled physical therapy both aquatic and land based to improve strength, endurance and toleration to activity improving her QOL and improving her management of her chronic conditions.  Pt reports she is not afraid of water but she does not swim.    OBJECTIVE IMPAIRMENTS: decreased activity tolerance, decreased endurance, decreased mobility, decreased strength, and pain.   ACTIVITY LIMITATIONS: lifting, bending, standing, squatting, stairs, transfers, and bed mobility  PARTICIPATION LIMITATIONS: meal prep, cleaning, laundry, shopping, community activity, and yard work  PERSONAL FACTORS: Time since onset of injury/illness/exacerbation are also affecting patient's functional outcome.   REHAB POTENTIAL: Good  CLINICAL DECISION MAKING: Evolving/moderate complexity  EVALUATION COMPLEXITY: Moderate   GOALS: Goals reviewed with patient? Yes  SHORT TERM GOALS: Target date: 12/05/22  Pt will tolerate full aquatic sessions consistently without increase in pain and with improving function to demonstrate good toleration and effectiveness of intervention.   Baseline: Goal status: INITIAL  2.  Pt will improve on 5 X STS test to <or= 20s to demonstrate improving functional lower extremity strength, transitional movements, and balance  Baseline: 32.41 Goal status: INITIAL  3.  Pt will decide on long term benefit of aquatic therapy intervention and gain access to pool if advantageous. Baseline: uncertain of benefit Goal status: INITIAL  4.  Pt will have decreased P! With lumbar ROM. Baseline: See chart Goal status: INITIAL    LONG  TERM GOALS: Target date: 01/02/23  Pt to meet stated Foto Goal 54% Baseline: 45% Goal status: INITIAL  2.  Pt will be indep with final HEP's (land and aquatic as appropriate) for continued management of condition  Baseline:  Goal status: INITIAL  3.  Pt will report turning in bed without increase in pain Baseline: sup<>sit and turning painful Goal status: INITIAL  4.  Pt will be able to walk 1 mile to demonstrate improved mobility/personal goal Baseline: 461ft Goal status: INITIAL  5.  Pt to report less fatigue being able tolerate daily activity without being "done"  until 500 pm. Baseline: 300p Goal status: INITIAL    PLAN:  PT FREQUENCY: 1-2x/week  PT DURATION: 8 weeks 12 visits (first 4 aquatic then alternating)  PLANNED INTERVENTIONS: Therapeutic exercises, Therapeutic activity, Neuromuscular re-education, Balance training, Gait training, Patient/Family education, Self Care, Joint mobilization, Stair training, Orthotic/Fit training, DME instructions, Aquatic Therapy, Dry Needling, Electrical stimulation, Cryotherapy, Moist heat, Splintting, Taping, Biofeedback, Ionotophoresis 4mg /ml Dexamethasone, Manual therapy, and Re-evaluation.  PLAN FOR NEXT SESSION: aquatic and land based: Instruction on strengthening, stretching and balance exercises, establish HEP, pt edu on management of chronic conditions Zebedee Iba PT, DPT 12/08/22 2:44 PM    Referring diagnosis? M51.36 (ICD-10-CM) - DDD (degenerative disc disease), lumbar  Treatment diagnosis? (if different than referring diagnosis) same What was this (referring dx) caused by? []  Surgery []  Fall [x]  Ongoing issue [x]  Arthritis []  Other: ____________  Laterality: []  Rt []  Lt [x]  Both  Check all possible CPT codes:  *CHOOSE 10 OR LESS*    [x]  97110 (Therapeutic Exercise)  []  92507 (SLP Treatment)  [x]  97112 (Neuro Re-ed)   []  01027 (Swallowing Treatment)   [x]  97116 (Gait Training)   []  [id number] (Cognitive Training, 1st  15 minutes) [x]  97140 (Manual Therapy)   []  97130 (Cognitive Training, each add'l 15 minutes)  [x]  97164 (Re-evaluation)                              []  Other, List CPT Code ____________  [x]  97530 (Therapeutic Activities)     [x]  97535 (Self Care)   [x]  All codes above (97110 - 97535)  []  97012 (Mechanical Traction)  []  97014 (E-stim  Unattended)  []  97032 (E-stim manual)  []  97033 (Ionto)  []  [address] (Ultrasound) []  97750 (Physical Performance Training) [x]  [id number] (Aquatic Therapy) []  97016 (Vasopneumatic Device) []  [id number] (Paraffin) []  97034 (Contrast Bath) []  [phone] (Wound Care 1st 20 sq cm) []  97598 (Wound Care each add'l 20 sq cm) []  97760 (Orthotic Fabrication, Fitting, Training Initial) []  [id number] (Prosthetic Management and Training Initial) []  [id number] (Orthotic or Prosthetic Training/ Modification Subsequent)

## 2022-12-11 ENCOUNTER — Encounter (HOSPITAL_BASED_OUTPATIENT_CLINIC_OR_DEPARTMENT_OTHER): Payer: Self-pay | Admitting: Physical Therapy

## 2022-12-11 ENCOUNTER — Ambulatory Visit (HOSPITAL_BASED_OUTPATIENT_CLINIC_OR_DEPARTMENT_OTHER): Payer: Medicare HMO | Admitting: Physical Therapy

## 2022-12-11 DIAGNOSIS — M5459 Other low back pain: Secondary | ICD-10-CM

## 2022-12-11 DIAGNOSIS — M6281 Muscle weakness (generalized): Secondary | ICD-10-CM

## 2022-12-11 DIAGNOSIS — M797 Fibromyalgia: Secondary | ICD-10-CM

## 2022-12-11 NOTE — Therapy (Signed)
OUTPATIENT PHYSICAL THERAPY THORACOLUMBAR TREATMENT   Patient Name: Whitney Ward MRN: [id number] DOB:1951-01-28, 72 y.o., female Today's Date: 12/11/2022  END OF SESSION:  PT End of Session - 12/11/22 0829     Visit Number 7    Number of Visits 12    Date for PT Re-Evaluation 01/02/23    Authorization Type [employer] m[employer]    Progress Note Due on Visit 10    PT Start Time 0820    PT Stop Time 0900    PT Time Calculation (min) 40 min    Activity Tolerance Patient tolerated treatment well    Behavior During Therapy WFL for tasks assessed/performed               Past Medical History:  Diagnosis Date   Arthritis    Breast cancer (HCC)    Breast cancer (HCC)    Broken toe    CAD (coronary artery disease)    Fibromyalgia    Hypertension    MI (myocardial infarction) (HCC)    Past Surgical History:  Procedure Laterality Date   ABDOMINAL HYSTERECTOMY     BREAST SURGERY     CESAREAN SECTION     ingrown toenail  03/21/2021   SMALL INTESTINE SURGERY     TONSILLECTOMY     Patient Active Problem List   Diagnosis Date Noted   Fibromyalgia 10/09/2016   Primary insomnia 10/09/2016   Other fatigue 10/09/2016   Tendinopathy of right shoulder 10/09/2016   Trochanteric bursitis of right hip 10/09/2016   Primary osteoarthritis of both hands 10/09/2016   Chronic kidney disease 10/09/2016   Essential hypertension 10/09/2016   Gastroesophageal reflux disease 10/09/2016   Restless leg syndrome 10/09/2016   Malignant neoplasm of female breast (HCC) 10/09/2016   History of breast cancer 10/09/2016   Genital herpes simplex 10/09/2016    PCP: Elinor Dodge, MD   REFERRING PROVIDER: [name], MD   REFERRING DIAG: M51.36 (ICD-10-CM) - DDD (degenerative disc disease), lumbar   Rationale for Evaluation and Treatment: Rehabilitation  THERAPY DIAG:  Other low back pain  Muscle weakness (generalized)  Fibromyalgia  ONSET DATE: >10 yr  SUBJECTIVE:                                                                                                                                                                                            SUBJECTIVE STATEMENT: Pt states that she did much better in the land session than she did the time before.  She reports she is having less back pain when rolling over (still present, not as intense).   She plans to do water  aerobics classes at [facility] after discharge.    PERTINENT HISTORY:  a 72 y.o. female with history of osteoarthritis, fibromyalgia and trochanteric bursitis   PAIN:  Are you having pain? Yes: NPRS scale: 1/10 Pain location: lower back (R>L) Pain description: dull ache, in bed achy Aggravating factors: lying down, walking > 5 minutes,  Relieving factors: gabapentin, OTC meds, resting in recliner.  PRECAUTIONS: Fall  WEIGHT BEARING RESTRICTIONS: No  FALLS:  Has patient fallen in last 6 months? No  LIVING ENVIRONMENT: Lives with: lives with their family and lives with their spouse Lives in: House/apartment Stairs: Yes: Internal: 16 steps; can reach both Has following equipment at home: None  OCCUPATION: Retired Therapist, music  PLOF: Independent  PATIENT GOALS: Be able to turn in bed with less pain, increase mobility, decrease fatigue    OBJECTIVE:   DIAGNOSTIC FINDINGS:  Degenerative disc disease, lumbar - X-ray in 02/2021 revealed arthritic changes; scoliosis of the lumbar spine   PATIENT SURVEYS:  FOTO primary score 45% with goal of 54% 12/11/22: 57%  SCREENING FOR RED FLAGS:   WFL  COGNITION: Overall cognitive status: Within functional limits for tasks assessed     SENSATION: WFL  MUSCLE LENGTH: Hamstrings: Right 70 deg; Left 80 deg   POSTURE: No Significant postural limitations  PALPATION: TTP bilat hip bursas  LUMBAR ROM:   AROM eval  Flexion Below patella P!  Extension wfl  Right lateral flexion Wfl P!  Left lateral flexion Wfl P!  Right rotation    Left rotation    (Blank rows = not tested)  LOWER EXTREMITY ROM:     wfl  LOWER EXTREMITY MMT:    MMT Right eval Left eval  Hip flexion 4 P! 4P!  Hip extension    Hip abduction 4P! 4P!  Hip adduction    Hip internal rotation    Hip external rotation    Knee flexion 5 5  Knee extension    Ankle dorsiflexion    Ankle plantarflexion    Ankle inversion    Ankle eversion     (Blank rows = not tested)  LUMBAR SPECIAL TESTS:  Straight leg raise test: Negative, Slump test: Negative, and Thomas test: Negative  FUNCTIONAL TESTS:  5 times sit to stand: from pool bench 32.41 Timed up and go (TUG): 10.58 4 stage balance test: wfl  12/11/22: TUG 20.75 sec  GAIT: Distance walked: 200 Assistive device utilized: None Level of assistance: Complete Independence Comments: wfl  TODAY'S TREATMENT:    12/11/22 TUG  Pt seen for aquatic therapy today.  Treatment took place in water 3.5-4.75 ft in depth at the [facility] pool. Temp of water was 91.  Pt entered/exited the pool via stairs independently with bilat rail.  *unsupported:  walking forward/ backward  * side stepping with yellow hand floats and arm addct * hip hinge with forward arm reach with yellow hand floats x 10 * UE on yellow hand floats:  3 way straight leg kick, 2 x5 each LE * straddling yellow noodle holding corner:  suspended legs for decompression, cycling, hip abdct/ addct * TrA set with solid noodle pull down in standard stance, and staggered stance 2 x 5 each position * farmer carry with solid noodle at side (unilateral) walking backwards/ forwards * kick board row in staggered stance x 10 each LE forward; repeated with vectors (11, 12, 1 o'clock) * back against wall:  3 way LE stretch with ankle on noodle * fig 4 stretch holding wall at  4 feet (L tighter than R)  6/10 Nustep warm up Lvl 6 6 min  STS from midheight table RTB at knees 3x8 Foam roller RDL 10x; 15lb KB RDL 3x8 at wall Figure 4  stretch 30s 3x each pressing down shuttle Leg press 3x8 75lbs (single leg) S/L shuttle leg press 50lbs 2x10                                                                     12/04/22  Pt seen for aquatic therapy today.  Treatment took place in water 3.5-4.75 ft in depth at the [facility] pool. Temp of water was 91.  Pt entered/exited the pool via stairs independently with bilat rail.  *unsupported:  walking forward/ backward  *core engagement: yellow HB carry forward and back then side ways *side lunges shoulder add/abd yellow hab x 4 widths *resisted side stepping with lt blue band x 6 widths *seated clams lt blue band 2x10 *STS resistance band at knees x 10 *hip hinge 2 x 10 *TrA set with solid noodle->thick yellow noodle wide stance then staggered in 4.29ft x 10 ea *Vertical suspension for decompression.   *KB row le staggered x 15   Pt requires the buoyancy and hydrostatic pressure of water for support, and to offload joints by unweighting joint load by at least 50 % in navel deep water and by at least 75-80% in chest to neck deep water.  Viscosity of the water is needed for resistance of strengthening. Water current perturbations provides challenge to standing balance requiring increased core activation.   Previous:  Nustep warm up Lvl 6 5 min  STS from midheight table RTB at knees 3x8 Bridge RTB at knees 3x8 Figure 4 stretch 30s 3x each pressing down Step up 6" 2x10 each shuttle Leg press 3x8 100lbs Jefferson curl/segmental flexion 3x5   PATIENT EDUCATION:  Education details:  aquatic modifications/ progressions  Person educated: Patient Education method: Programmer, multimedia,  Education comprehension: verbalized understanding  HOME EXERCISE PROGRAM: Access Code: [email or web address] URL: [email or web address] Date: 12/08/2022 Prepared by: Zebedee Iba  ASSESSMENT:  CLINICAL IMPRESSION: Pt reported increase in back pain up to 3/10 after completing hip hinge with  forward arm reach at 4 ft.  All other exercises tolerated well, especially the 3 way LE stretch with back against wall.  Began to create aquatic HEP and plan to issue it in upcoming session. Pt has met STG 2 and 3, and LTG1.  Pt would benefit from continued skilled therapy in order to reach goals and maximize functional lumbopelvic strength and ROM for return to exercise and self pain management.     From eval:  Patient is a 72 y.o. f who was seen today for physical therapy evaluation and treatment for DDD Lumbar spine. She does not report any radicular symptoms nor are any of the special tests positive for SIJ dysfunction or sciatica.  Her main complaint is her fibromyalgia limiting symptoms of fatigue and pain from waist to feet. She does have chronic hip bursitis bilaterally and oa knees. She has had multiple episodes of physical therapy in the past. Pt edu on benefits of aquatic warm water therapy with fibromyalgia and vu of the importance of continuing with an aquatic HEP after DC for benefits to  continue. She is interested in returning to land based therapy for an updated HEP in which she can complete in event she does not gain as access to a pool or as an addition to.  She is a good candidate for skilled physical therapy both aquatic and land based to improve strength, endurance and toleration to activity improving her QOL and improving her management of her chronic conditions. Pt reports she is not afraid of water but she does not swim.    OBJECTIVE IMPAIRMENTS: decreased activity tolerance, decreased endurance, decreased mobility, decreased strength, and pain.   ACTIVITY LIMITATIONS: lifting, bending, standing, squatting, stairs, transfers, and bed mobility  PARTICIPATION LIMITATIONS: meal prep, cleaning, laundry, shopping, community activity, and yard work  PERSONAL FACTORS: Time since onset of injury/illness/exacerbation are also affecting patient's functional outcome.   REHAB POTENTIAL:  Good  CLINICAL DECISION MAKING: Evolving/moderate complexity  EVALUATION COMPLEXITY: Moderate   GOALS: Goals reviewed with patient? Yes  SHORT TERM GOALS: Target date: 12/05/22  Pt will tolerate full aquatic sessions consistently without increase in pain and with improving function to demonstrate good toleration and effectiveness of intervention.   Baseline: Goal status:MET - 12/11/22  2.  Pt will improve on 5 X STS test to <or= 20s to demonstrate improving functional lower extremity strength, transitional movements, and balance  Baseline: 32.41 at eval;  20s today  Goal status:MET - 12/11/22  3.  Pt will decide on long term benefit of aquatic therapy intervention and gain access to pool if advantageous. Baseline: uncertain of benefit at eval;   joined [employer], plans to do water fitness classes  Goal status: MET - 12/11/22  4.  Pt will have decreased P! With lumbar ROM. Baseline: See chart Goal status: INITIAL    LONG TERM GOALS: Target date: 01/02/23  Pt to meet stated Foto Goal 54% Baseline: 45% at eval;  57% today Goal status: MET - 12/11/22  2.  Pt will be indep with final HEP's (land and aquatic as appropriate) for continued management of condition  Baseline:  Goal status: INITIAL  3.  Pt will report turning in bed without increase in pain Baseline: sup<>sit and turning painful at eval Goal status: In progress 12/11/22  4.  Pt will be able to walk 1 mile to demonstrate improved mobility/personal goal Baseline: 436ft at eval;  0.5 mile -12/11/22 Goal status:Partially met - 12/11/22  5.  Pt to report less fatigue being able tolerate daily activity without being "done"  until 500 pm. Baseline: 300p Goal status: INITIAL    PLAN:  PT FREQUENCY: 1-2x/week  PT DURATION: 8 weeks 12 visits (first 4 aquatic then alternating)  PLANNED INTERVENTIONS: Therapeutic exercises, Therapeutic activity, Neuromuscular re-education, Balance training, Gait training, Patient/Family  education, Self Care, Joint mobilization, Stair training, Orthotic/Fit training, DME instructions, Aquatic Therapy, Dry Needling, Electrical stimulation, Cryotherapy, Moist heat, Splintting, Taping, Biofeedback, Ionotophoresis 4mg /ml Dexamethasone, Manual therapy, and Re-evaluation.  PLAN FOR NEXT SESSION: aquatic and land based: Instruction on strengthening, stretching and balance exercises, establish HEP  Mayer Camel, PTA 12/11/22 9:20 AM [facility] [address] [place], [place], [address] Phone: [phone]   Fax:  [phone]   Referring diagnosis? M51.36 (ICD-10-CM) - DDD (degenerative disc disease), lumbar  Treatment diagnosis? (if different than referring diagnosis) same What was this (referring dx) caused by? []  Surgery []  Fall [x]  Ongoing issue [x]  Arthritis []  Other: ____________  Laterality: []  Rt []  Lt [x]  Both  Check all possible CPT codes:  *CHOOSE 10 OR  LESS*    [x]  [id number] (Therapeutic Exercise)  []  92507 (SLP Treatment)  [x]  [id number] (Neuro Re-ed)   []  92526 (Swallowing Treatment)   [x]  97116 (Gait Training)   []  [phone] (Cognitive Training, 1st 15 minutes) [x]  97140 (Manual Therapy)   []  97130 (Cognitive Training, each add'l 15 minutes)  [x]  60454 (Re-evaluation)                              []  Other, List CPT Code ____________  [x]  09811 (Therapeutic Activities)     [x]  97535 (Self Care)   [x]  All codes above (97110 - 97535)  []  97012 (Mechanical Traction)  []  97014 (E-stim Unattended)  []  97032 (E-stim manual)  []  97033 (Ionto)  []  97035 (Ultrasound) []  97750 (Physical Performance Training) [x]  [id number] (Aquatic Therapy) []  97016 (Vasopneumatic Device) []  [id number] (Paraffin) []  97034 (Contrast Bath) []  97597 (Wound Care 1st 20 sq cm) []  97598 (Wound Care each add'l 20 sq cm) []  97760 (Orthotic Fabrication, Fitting, Training Initial) []  [id number] (Prosthetic Management and Training Initial) []  [id number]  (Orthotic or Prosthetic Training/ Modification Subsequent)

## 2022-12-15 ENCOUNTER — Ambulatory Visit (HOSPITAL_BASED_OUTPATIENT_CLINIC_OR_DEPARTMENT_OTHER): Payer: Medicare HMO | Admitting: Physical Therapy

## 2022-12-18 ENCOUNTER — Ambulatory Visit (HOSPITAL_BASED_OUTPATIENT_CLINIC_OR_DEPARTMENT_OTHER): Payer: Medicare HMO | Attending: Rheumatology | Admitting: Physical Therapy

## 2022-12-18 ENCOUNTER — Encounter (HOSPITAL_BASED_OUTPATIENT_CLINIC_OR_DEPARTMENT_OTHER): Payer: Self-pay | Admitting: Physical Therapy

## 2022-12-18 DIAGNOSIS — M5459 Other low back pain: Secondary | ICD-10-CM | POA: Diagnosis present

## 2022-12-18 DIAGNOSIS — M797 Fibromyalgia: Secondary | ICD-10-CM | POA: Diagnosis present

## 2022-12-18 DIAGNOSIS — M6281 Muscle weakness (generalized): Secondary | ICD-10-CM | POA: Insufficient documentation

## 2022-12-18 NOTE — Therapy (Addendum)
OUTPATIENT PHYSICAL THERAPY THORACOLUMBAR TREATMENT   Patient Name: Whitney Ward MRN: [id number] DOB:[birth year], 72 y.o., female Today's Date: 12/18/2022  END OF SESSION:  PT End of Session - 12/18/22 0921     Visit Number 8    Number of Visits 12    Date for PT Re-Evaluation 01/02/23    Authorization Type [employer] m[employer]    Progress Note Due on Visit 10    PT Start Time 0920   arrives late for appt   PT Stop Time 0945    PT Time Calculation (min) 25 min    Activity Tolerance Patient tolerated treatment well    Behavior During Therapy [facility] for tasks assessed/performed               Past Medical History:  Diagnosis Date   Arthritis    Breast cancer (HCC)    Breast cancer (HCC)    Broken toe    CAD (coronary artery disease)    Fibromyalgia    Hypertension    MI (myocardial infarction) (HCC)    Past Surgical History:  Procedure Laterality Date   ABDOMINAL HYSTERECTOMY     BREAST SURGERY     CESAREAN SECTION     ingrown toenail  03/21/2021   SMALL INTESTINE SURGERY     TONSILLECTOMY     Patient Active Problem List   Diagnosis Date Noted   Fibromyalgia 10/09/2016   Primary insomnia 10/09/2016   Other fatigue 10/09/2016   Tendinopathy of right shoulder 10/09/2016   Trochanteric bursitis of right hip 10/09/2016   Primary osteoarthritis of both hands 10/09/2016   Chronic kidney disease 10/09/2016   Essential hypertension 10/09/2016   Gastroesophageal reflux disease 10/09/2016   Restless leg syndrome 10/09/2016   Malignant neoplasm of female breast (HCC) 10/09/2016   History of breast cancer 10/09/2016   Genital herpes simplex 10/09/2016    PCP: Elinor Dodge, MD   REFERRING PROVIDER: [name], MD   REFERRING DIAG: M51.36 (ICD-10-CM) - DDD (degenerative disc disease), lumbar   Rationale for Evaluation and Treatment: Rehabilitation  THERAPY DIAG:  Other low back pain  Muscle weakness (generalized)  Fibromyalgia  ONSET DATE:  >10 yr  SUBJECTIVE:                                                                                                                                                                                           SUBJECTIVE STATEMENT: Pt states she is doing well.  Land based intervention going very well    PERTINENT HISTORY:  a 72 y.o. female with history of osteoarthritis, fibromyalgia and trochanteric bursitis   PAIN:  Are  you having pain? Yes: NPRS scale: 2/10 Pain location: lower back (R>L) Pain description: dull ache, in bed achy Aggravating factors: lying down, walking > 5 minutes,  Relieving factors: gabapentin, OTC meds, resting in recliner.  PRECAUTIONS: Fall  WEIGHT BEARING RESTRICTIONS: No  FALLS:  Has patient fallen in last 6 months? No  LIVING ENVIRONMENT: Lives with: lives with their family and lives with their spouse Lives in: House/apartment Stairs: Yes: Internal: 16 steps; can reach both Has following equipment at home: None  OCCUPATION: Retired Therapist, music  PLOF: Independent  PATIENT GOALS: Be able to turn in bed with less pain, increase mobility, decrease fatigue    OBJECTIVE:   DIAGNOSTIC FINDINGS:  Degenerative disc disease, lumbar - X-ray in 02/2021 revealed arthritic changes; scoliosis of the lumbar spine   PATIENT SURVEYS:  FOTO primary score 45% with goal of 54% 12/11/22: 57%  SCREENING FOR RED FLAGS:   WFL  COGNITION: Overall cognitive status: Within functional limits for tasks assessed     SENSATION: WFL  MUSCLE LENGTH: Hamstrings: Right 70 deg; Left 80 deg   POSTURE: No Significant postural limitations  PALPATION: TTP bilat hip bursas  LUMBAR ROM:   AROM eval  Flexion Below patella P!  Extension wfl  Right lateral flexion Wfl P!  Left lateral flexion Wfl P!  Right rotation   Left rotation    (Blank rows = not tested)  LOWER EXTREMITY ROM:     wfl  LOWER EXTREMITY MMT:    MMT Right eval Left eval  Hip flexion 4  P! 4P!  Hip extension    Hip abduction 4P! 4P!  Hip adduction    Hip internal rotation    Hip external rotation    Knee flexion 5 5  Knee extension    Ankle dorsiflexion    Ankle plantarflexion    Ankle inversion    Ankle eversion     (Blank rows = not tested)  LUMBAR SPECIAL TESTS:  Straight leg raise test: Negative, Slump test: Negative, and Thomas test: Negative  FUNCTIONAL TESTS:  5 times sit to stand: from pool bench 32.41 Timed up and go (TUG): 10.58 4 stage balance test: wfl  12/11/22: TUG 20.75 sec  GAIT: Distance walked: 200 Assistive device utilized: None Level of assistance: Complete Independence Comments: wfl  TODAY'S TREATMENT:    12/18/22  Pt seen for aquatic therapy today.  Treatment took place in water 3.5-4.75 ft in depth at the [facility] pool. Temp of water was 91.  Pt entered/exited the pool via stairs independently with bilat rail.  *unsupported:  walking forward/ backward  * Hand buoy carry yellow HB at bilater side then unilateral at side (unilateral) walking backwards/ forwards * side stepping with yellow hand floats and arm addct x 4 widths * TrA set with solid noodle (easy) -> yellow noodle (better challenge but difficult) pull down in standard stance, and staggered stance 2 x 5 each position * hip hinge with forward arm reach with yellow noodle x 10 * UE on yellow noodle:  3 way straight leg kick, x8 each LE   6/10 Nustep warm up Lvl 6 6 min  STS from midheight table RTB at knees 3x8 Foam roller RDL 10x; 15lb KB RDL 3x8 at wall Figure 4 stretch 30s 3x each pressing down shuttle Leg press 3x8 75lbs (single leg) S/L shuttle leg press 50lbs 2x10    Previous:  Nustep warm up Lvl 6 5 min  STS from midheight table RTB at knees 3x8  Bridge RTB at knees 3x8 Figure 4 stretch 30s 3x each pressing down Step up 6" 2x10 each shuttle Leg press 3x8 100lbs Jefferson curl/segmental flexion 3x5   PATIENT EDUCATION:  Education details:   aquatic modifications/ progressions  Person educated: Patient Education method: Programmer, multimedia,  Education comprehension: verbalized understanding  HOME EXERCISE PROGRAM: Access Code: [email or web address] URL: [email or web address] Date: 12/08/2022 Prepared by: Zebedee Iba  Access Code: [email or web address] URL: [email or web address] Date: 12/25/2022 Prepared by: Geni Bers  Exercises - Hand Buoy Carry  - Side lunge with hand buoys  - Noodle press  - 1 x daily - 2-3 x weekly - 1-3 sets - 10 reps - Standing Hip Hinge  - 1 x daily - 2-3 x weekly - 1-3 sets - 10 reps - Standing 3-Way Leg Reach  - 1 x daily - 2-3 x weekly - 1-3 sets - 10 reps - Standing Hip Flexion Extension at [employer]  - 1 x daily - 2-3 x weekly - 1-3 sets - 10 reps - Kick Board Row  - 1 x daily - 2-3 x weekly - 1-3 sets - 10 reps  ASSESSMENT:  CLINICAL IMPRESSION: Pt progresses with core strength moving to more dense foam with core engagement.  She does have difficulty but is a good challenge.Modified depth to facilitate with good response. She has 1 more aquatic session in cert period.  Will create aquatic HEP and issue next session.  It is anticipated at this time that she will have met her max potential in this setting.  She has access to pool.  Goals ongoing     From eval:  Patient is a 72 y.o. f who was seen today for physical therapy evaluation and treatment for DDD Lumbar spine. She does not report any radicular symptoms nor are any of the special tests positive for SIJ dysfunction or sciatica.  Her main complaint is her fibromyalgia limiting symptoms of fatigue and pain from waist to feet. She does have chronic hip bursitis bilaterally and oa knees. She has had multiple episodes of physical therapy in the past. Pt edu on benefits of aquatic warm water therapy with fibromyalgia and vu of the importance of continuing with an aquatic HEP after DC for benefits to continue. She is interested in returning to  land based therapy for an updated HEP in which she can complete in event she does not gain as access to a pool or as an addition to.  She is a good candidate for skilled physical therapy both aquatic and land based to improve strength, endurance and toleration to activity improving her QOL and improving her management of her chronic conditions. Pt reports she is not afraid of water but she does not swim.    OBJECTIVE IMPAIRMENTS: decreased activity tolerance, decreased endurance, decreased mobility, decreased strength, and pain.   ACTIVITY LIMITATIONS: lifting, bending, standing, squatting, stairs, transfers, and bed mobility  PARTICIPATION LIMITATIONS: meal prep, cleaning, laundry, shopping, community activity, and yard work  PERSONAL FACTORS: Time since onset of injury/illness/exacerbation are also affecting patient's functional outcome.   REHAB POTENTIAL: Good  CLINICAL DECISION MAKING: Evolving/moderate complexity  EVALUATION COMPLEXITY: Moderate   GOALS: Goals reviewed with patient? Yes  SHORT TERM GOALS: Target date: 12/05/22  Pt will tolerate full aquatic sessions consistently without increase in pain and with improving function to demonstrate good toleration and effectiveness of intervention.   Baseline: Goal status:MET - 12/11/22  2.  Pt will improve on 5 X STS test to <or= 20s to demonstrate improving functional  lower extremity strength, transitional movements, and balance  Baseline: 32.41 at eval;  20s today  Goal status:MET - 12/11/22  3.  Pt will decide on long term benefit of aquatic therapy intervention and gain access to pool if advantageous. Baseline: uncertain of benefit at eval;   joined [employer], plans to do water fitness classes  Goal status: MET - 12/11/22  4.  Pt will have decreased P! With lumbar ROM. Baseline: See chart Goal status: INITIAL    LONG TERM GOALS: Target date: 01/02/23  Pt to meet stated Foto Goal 54% Baseline: 45% at eval;  57% today Goal  status: MET - 12/11/22  2.  Pt will be indep with final HEP's (land and aquatic as appropriate) for continued management of condition  Baseline:  Goal status: INITIAL  3.  Pt will report turning in bed without increase in pain Baseline: sup<>sit and turning painful at eval Goal status: In progress 12/11/22  4.  Pt will be able to walk 1 mile to demonstrate improved mobility/personal goal Baseline: 464ft at eval;  0.5 mile -12/11/22 Goal status:Partially met - 12/11/22  5.  Pt to report less fatigue being able tolerate daily activity without being "done"  until 500 pm. Baseline: 300p Goal status: INITIAL    PLAN:  PT FREQUENCY: 1-2x/week  PT DURATION: 8 weeks 12 visits (first 4 aquatic then alternating)  PLANNED INTERVENTIONS: Therapeutic exercises, Therapeutic activity, Neuromuscular re-education, Balance training, Gait training, Patient/Family education, Self Care, Joint mobilization, Stair training, Orthotic/Fit training, DME instructions, Aquatic Therapy, Dry Needling, Electrical stimulation, Cryotherapy, Moist heat, Splintting, Taping, Biofeedback, Ionotophoresis 4mg /ml Dexamethasone, Manual therapy, and Re-evaluation.  PLAN FOR NEXT SESSION: aquatic and land based: Instruction on strengthening, stretching and balance exercises, establish HEP  Rushie Chestnut) Deliana Avalos MPT 12/18/22 9:23 AM [facility] [address] [place], [place], [address] Phone: [phone]   Fax:  [phone]  Addend Rushie Chestnut) Brittiney Dicostanzo [place] 12-25-22 [phone]   Referring diagnosis? M51.36 (ICD-10-CM) - DDD (degenerative disc disease), lumbar  Treatment diagnosis? (if different than referring diagnosis) same What was this (referring dx) caused by? []  Surgery []  Fall [x]  Ongoing issue [x]  Arthritis []  Other: ____________  Laterality: []  Rt []  Lt [x]  Both  Check all possible CPT codes:  *CHOOSE 10 OR LESS*    [x]  97110 (Therapeutic Exercise)  []   92507 (SLP Treatment)  [x]  97112 (Neuro Re-ed)   []  92526 (Swallowing Treatment)   [x]  46962 (Gait Training)   []  [id number] (Cognitive Training, 1st 15 minutes) [x]  97140 (Manual Therapy)   []  97130 (Cognitive Training, each add'l 15 minutes)  [x]  97164 (Re-evaluation)                              []  Other, List CPT Code ____________  [x]  97530 (Therapeutic Activities)     [x]  97535 (Self Care)   [x]  All codes above (97110 - 97535)  []  97012 (Mechanical Traction)  []  97014 (E-stim Unattended)  []  97032 (E-stim manual)  []  97033 (Ionto)  []  97035 (Ultrasound) []  97750 (Physical Performance Training) [x]  [id number] (Aquatic Therapy) []  97016 (Vasopneumatic Device) []  [id number] (Paraffin) []  97034 (Contrast Bath) []  95284 (Wound Care 1st 20 sq cm) []  97598 (Wound Care each add'l 20 sq cm) []  97760 (Orthotic Fabrication, Fitting, Training Initial) []  [id number] (Prosthetic Management and Training Initial) []  [id number] (Orthotic or Prosthetic Training/ Modification Subsequent)

## 2022-12-22 ENCOUNTER — Encounter (HOSPITAL_BASED_OUTPATIENT_CLINIC_OR_DEPARTMENT_OTHER): Payer: Self-pay

## 2022-12-22 ENCOUNTER — Ambulatory Visit (HOSPITAL_BASED_OUTPATIENT_CLINIC_OR_DEPARTMENT_OTHER): Payer: Medicare HMO | Admitting: Physical Therapy

## 2022-12-25 ENCOUNTER — Ambulatory Visit (HOSPITAL_BASED_OUTPATIENT_CLINIC_OR_DEPARTMENT_OTHER): Payer: Medicare HMO | Admitting: Physical Therapy

## 2022-12-25 ENCOUNTER — Encounter (HOSPITAL_BASED_OUTPATIENT_CLINIC_OR_DEPARTMENT_OTHER): Payer: Self-pay | Admitting: Physical Therapy

## 2022-12-25 DIAGNOSIS — M797 Fibromyalgia: Secondary | ICD-10-CM

## 2022-12-25 DIAGNOSIS — M5459 Other low back pain: Secondary | ICD-10-CM

## 2022-12-25 DIAGNOSIS — M6281 Muscle weakness (generalized): Secondary | ICD-10-CM

## 2022-12-25 NOTE — Therapy (Signed)
OUTPATIENT PHYSICAL THERAPY THORACOLUMBAR TREATMENT PHYSICAL THERAPY DISCHARGE SUMMARY  Visits from Start of Care: 9  Current functional level related to goals / functional outcomes: Pt is safe and indep with functional mobility and ADL's   Remaining deficits: Flare of fibromyalgia=pain   Education / Equipment: Management of condition/ HEP   Patient agrees to discharge. Patient goals were partially met. Patient is being discharged due to being pleased with the current functional level.    Patient Name: Whitney Ward MRN: [id number] DOB:[birth year], 72 y.o., female Today's Date: 12/25/2022  END OF SESSION:  PT End of Session - 12/25/22 1344     Visit Number 9    Number of Visits 12    Date for PT Re-Evaluation 01/02/23    Authorization Type [employer] m[employer]    Progress Note Due on Visit 10    PT Start Time 0902    PT Stop Time 0945    PT Time Calculation (min) 43 min    Activity Tolerance Patient tolerated treatment well    Behavior During Therapy WFL for tasks assessed/performed                Past Medical History:  Diagnosis Date   Arthritis    Breast cancer (HCC)    Breast cancer (HCC)    Broken toe    CAD (coronary artery disease)    Fibromyalgia    Hypertension    MI (myocardial infarction) (HCC)    Past Surgical History:  Procedure Laterality Date   ABDOMINAL HYSTERECTOMY     BREAST SURGERY     CESAREAN SECTION     ingrown toenail  03/21/2021   SMALL INTESTINE SURGERY     TONSILLECTOMY     Patient Active Problem List   Diagnosis Date Noted   Fibromyalgia 10/09/2016   Primary insomnia 10/09/2016   Other fatigue 10/09/2016   Tendinopathy of right shoulder 10/09/2016   Trochanteric bursitis of right hip 10/09/2016   Primary osteoarthritis of both hands 10/09/2016   Chronic kidney disease 10/09/2016   Essential hypertension 10/09/2016   Gastroesophageal reflux disease 10/09/2016   Restless leg syndrome 10/09/2016   Malignant neoplasm of  female breast (HCC) 10/09/2016   History of breast cancer 10/09/2016   Genital herpes simplex 10/09/2016    PCP: Elinor Dodge, MD   REFERRING PROVIDER: [name], MD   REFERRING DIAG: M51.36 (ICD-10-CM) - DDD (degenerative disc disease), lumbar   Rationale for Evaluation and Treatment: Rehabilitation  THERAPY DIAG:  Other low back pain  Muscle weakness (generalized)  Fibromyalgia  ONSET DATE: >10 yr  SUBJECTIVE:  SUBJECTIVE STATEMENT: Pt reports high level of fatigue which she has been dealing with for a couple of weeks.  Leaving therapy and being "done" for rest of day  PERTINENT HISTORY:  a 72 y.o. female with history of osteoarthritis, fibromyalgia and trochanteric bursitis   PAIN:  Are you having pain? Yes: NPRS scale: 2/10 Pain location: lower back (R>L) Pain description: dull ache, in bed achy Aggravating factors: lying down, walking > 5 minutes,  Relieving factors: gabapentin, OTC meds, resting in recliner.  PRECAUTIONS: Fall  WEIGHT BEARING RESTRICTIONS: No  FALLS:  Has patient fallen in last 6 months? No  LIVING ENVIRONMENT: Lives with: lives with their family and lives with their spouse Lives in: House/apartment Stairs: Yes: Internal: 16 steps; can reach both Has following equipment at home: None  OCCUPATION: Retired Therapist, music  PLOF: Independent  PATIENT GOALS: Be able to turn in bed with less pain, increase mobility, decrease fatigue    OBJECTIVE:   DIAGNOSTIC FINDINGS:  Degenerative disc disease, lumbar - X-ray in 02/2021 revealed arthritic changes; scoliosis of the lumbar spine   PATIENT SURVEYS:  FOTO primary score 45% with goal of 54% 12/11/22: 57%  SCREENING FOR RED FLAGS:   WFL  COGNITION: Overall cognitive status: Within  functional limits for tasks assessed     SENSATION: WFL  MUSCLE LENGTH: Hamstrings: Right 70 deg; Left 80 deg   POSTURE: No Significant postural limitations  PALPATION: TTP bilat hip bursas  LUMBAR ROM:   AROM eval 12/25/22  Flexion Below patella P! wfl  Extension wfl   Right lateral flexion Wfl P! Slight discomfort end range  Left lateral flexion Wfl P! Slight discomfort end range  Right rotation    Left rotation     (Blank rows = not tested)  LOWER EXTREMITY ROM:     wfl  LOWER EXTREMITY MMT:    MMT Right eval Left eval 12/25/22 Right / Left  Hip flexion 4 P! 4P! 4+ P! / 5- P!  Hip extension     Hip abduction 4P! 4P! 5-P!  Hip adduction     Hip internal rotation     Hip external rotation     Knee flexion 5 5   Knee extension     Ankle dorsiflexion     Ankle plantarflexion     Ankle inversion     Ankle eversion      (Blank rows = not tested)  LUMBAR SPECIAL TESTS:  Straight leg raise test: Negative, Slump test: Negative, and Thomas test: Negative  FUNCTIONAL TESTS:  5 times sit to stand: from pool bench 32.41 Timed up and go (TUG): 10.58 4 stage balance test: wfl  12/11/22: TUG 20.75 sec  GAIT: Distance walked: 200 Assistive device utilized: None Level of assistance: Complete Independence Comments: wfl  TODAY'S TREATMENT:    12/25/22  Pt seen for aquatic therapy today.  Treatment took place in water 3.5-4.75 ft in depth at the [facility] pool. Temp of water was 91.  Pt entered/exited the pool via stairs independently with bilat rail.  - Hand Buoy Carry  - Side lunge with hand buoys  - Standing 'L' Stretch at [employer]   - Noodle press - Kick Board Row   - Standing Hip Hinge - Standing 3-Way Leg Reach  - Standing Hip Flexion Extension at Pool Wall   - Squat     6/10 Nustep warm up Lvl 6 6 min  STS from midheight table RTB at knees 3x8 Foam roller RDL 10x;  15lb KB RDL 3x8 at wall Figure 4 stretch 30s 3x each pressing  down shuttle Leg press 3x8 75lbs (single leg) S/L shuttle leg press 50lbs 2x10    Previous:  Nustep warm up Lvl 6 5 min  STS from midheight table RTB at knees 3x8 Bridge RTB at knees 3x8 Figure 4 stretch 30s 3x each pressing down Step up 6" 2x10 each shuttle Leg press 3x8 100lbs [place] curl/segmental flexion 3x5   PATIENT EDUCATION:  Education details:  aquatic modifications/ progressions  Person educated: Patient Education method: Programmer, multimedia,  Education comprehension: verbalized understanding  HOME EXERCISE PROGRAM: Access Code: [email or web address] URL: [email or web address] Date: 12/08/2022 Prepared by: Zebedee Iba  Access Code: [email or web address] URL: [email or web address] Date: 12/25/2022 Prepared by: Geni Bers  - Hand Buoy Carry  - Side lunge with hand buoys  - Standing 'L' Stretch at [employer]  - 1 x daily - 7 x weekly - 3 sets - 10 reps - Noodle press  - 1 x daily - 2-3 x weekly - 1-3 sets - 10 reps - Kick Board Row  - 1 x daily - 2-3 x weekly - 1-3 sets - 10 reps - Standing Hip Hinge  - 1 x daily - 2-3 x weekly - 1-3 sets - 10 reps - Standing 3-Way Leg Reach  - 1 x daily - 2-3 x weekly - 1-3 sets - 10 reps - Standing Hip Flexion Extension at [employer]  - 1 x daily - 2-3 x weekly - 1-3 sets - 10 reps - Squat  - 1 x daily - 7 x weekly - 3 sets - 10 reps  ASSESSMENT:  CLINICAL IMPRESSION: Pt reports high level of fatigue which seems to be getting worse.  Will try to see Md when able to get appt.  She reports she is happy with her progress up to this point but feels like she would prefer to stop therapy until she gets better toleration/decrease in fatigue level. She reports continued pain with turning in bed particularly right sided. She has been unable recently to do much walking due to fatigue and she has not improved on toleration to daily activtiy up towards 5p. She is directed through final Aquatic HEP which is created, laminated and issued.  Not  all goal mets due to early dc.     From eval:  Patient is a 72 y.o. f who was seen today for physical therapy evaluation and treatment for DDD Lumbar spine. She does not report any radicular symptoms nor are any of the special tests positive for SIJ dysfunction or sciatica.  Her main complaint is her fibromyalgia limiting symptoms of fatigue and pain from waist to feet. She does have chronic hip bursitis bilaterally and oa knees. She has had multiple episodes of physical therapy in the past. Pt edu on benefits of aquatic warm water therapy with fibromyalgia and vu of the importance of continuing with an aquatic HEP after DC for benefits to continue. She is interested in returning to land based therapy for an updated HEP in which she can complete in event she does not gain as access to a pool or as an addition to.  She is a good candidate for skilled physical therapy both aquatic and land based to improve strength, endurance and toleration to activity improving her QOL and improving her management of her chronic conditions. Pt reports she is not afraid of water but she does not swim.    OBJECTIVE IMPAIRMENTS: decreased activity tolerance, decreased endurance, decreased mobility,  decreased strength, and pain.   ACTIVITY LIMITATIONS: lifting, bending, standing, squatting, stairs, transfers, and bed mobility  PARTICIPATION LIMITATIONS: meal prep, cleaning, laundry, shopping, community activity, and yard work  PERSONAL FACTORS: Time since onset of injury/illness/exacerbation are also affecting patient's functional outcome.   REHAB POTENTIAL: Good  CLINICAL DECISION MAKING: Evolving/moderate complexity  EVALUATION COMPLEXITY: Moderate   GOALS: Goals reviewed with patient? Yes  SHORT TERM GOALS: Target date: 12/05/22  Pt will tolerate full aquatic sessions consistently without increase in pain and with improving function to demonstrate good toleration and effectiveness of intervention.    Baseline: Goal status:MET - 12/11/22  2.  Pt will improve on 5 X STS test to <or= 20s to demonstrate improving functional lower extremity strength, transitional movements, and balance  Baseline: 32.41 at eval;  20s today  Goal status:MET - 12/11/22  3.  Pt will decide on long term benefit of aquatic therapy intervention and gain access to pool if advantageous. Baseline: uncertain of benefit at eval;   joined [employer], plans to do water fitness classes  Goal status: MET - 12/11/22  4.  Pt will have decreased P! With lumbar ROM. Baseline: See chart Goal status: Met 12/25/22    LONG TERM GOALS: Target date: 01/02/23  Pt to meet stated Foto Goal 54% Baseline: 45% at eval;  57% today Goal status: MET - 12/11/22  2.  Pt will be indep with final HEP's (land and aquatic as appropriate) for continued management of condition  Baseline:  Goal status: Met 12/25/22  3.  Pt will report turning in bed without increase in pain Baseline: sup<>sit and turning painful at eval Goal status: In progress 12/11/22/ Improved but Not met 12/25/22  4.  Pt will be able to walk 1 mile to demonstrate improved mobility/personal goal Baseline: 452ft at eval;  0.5 mile -12/11/22 Goal status:Partially met - 12/11/22/ Not met 12/25/22  5.  Pt to report less fatigue being able tolerate daily activity without being "done"  until 500 pm. Baseline: 300p Goal status: Not Met     PLAN:  PT FREQUENCY: 1-2x/week  PT DURATION: 8 weeks 12 visits (first 4 aquatic then alternating)  PLANNED INTERVENTIONS: Therapeutic exercises, Therapeutic activity, Neuromuscular re-education, Balance training, Gait training, Patient/Family education, Self Care, Joint mobilization, Stair training, Orthotic/Fit training, DME instructions, Aquatic Therapy, Dry Needling, Electrical stimulation, Cryotherapy, Moist heat, Splintting, Taping, Biofeedback, Ionotophoresis 4mg /ml Dexamethasone, Manual therapy, and Re-evaluation.  PLAN FOR NEXT SESSION:  aquatic and land based: Instruction on strengthening, stretching and balance exercises, establish HEP  Rushie Chestnut) Venus Gilles MPT 12/25/22 1:45 PM [facility] [address] [place], [place], [address] Phone: [phone]   Fax:  [phone]     Referring diagnosis? M51.36 (ICD-10-CM) - DDD (degenerative disc disease), lumbar  Treatment diagnosis? (if different than referring diagnosis) same What was this (referring dx) caused by? []  Surgery []  Fall [x]  Ongoing issue [x]  Arthritis []  Other: ____________  Laterality: []  Rt []  Lt [x]  Both  Check all possible CPT codes:  *CHOOSE 10 OR LESS*    [x]  05397 (Therapeutic Exercise)  []  92507 (SLP Treatment)  [x]  97112 (Neuro Re-ed)   []  92526 (Swallowing Treatment)   [x]  97116 (Gait Training)   []  [id number] (Cognitive Training, 1st 15 minutes) [x]  97140 (Manual Therapy)   []  97130 (Cognitive Training, each add'l 15 minutes)  [x]  97164 (Re-evaluation)                              []   Other, List CPT Code ____________  [x]  97530 (Therapeutic Activities)     [x]  97535 (Self Care)   [x]  All codes above (97110 - 97535)  []  97012 (Mechanical Traction)  []  97014 (E-stim Unattended)  []  97032 (E-stim manual)  []  97033 (Ionto)  []  97035 (Ultrasound) []  35573 (Physical Performance Training) [x]  [id number] (Aquatic Therapy) []  97016 (Vasopneumatic Device) []  [id number] (Paraffin) []  97034 (Contrast Bath) []  97597 (Wound Care 1st 20 sq cm) []  97598 (Wound Care each add'l 20 sq cm) []  97760 (Orthotic Fabrication, Fitting, Training Initial) []  [id number] (Prosthetic Management and Training Initial) []  [id number] (Orthotic or Prosthetic Training/ Modification Subsequent)

## 2022-12-29 NOTE — Progress Notes (Signed)
Office Visit Note  Patient: Whitney Ward             Date of Birth: 1950-07-08           MRN: [id number]             PCP: Elinor Dodge, MD Referring: Gabriel Cirri* Visit Date: 12/31/2022 Occupation: @GUAROCC @  Subjective:  No chief complaint on file.   History of Present Illness: Whitney Ward is a 72 y.o. female ***     Activities of Daily Living:  Patient reports morning stiffness for *** {minute/hour:19697}.   Patient {ACTIONS;DENIES/REPORTS:21021675::"Denies"} nocturnal pain.  Difficulty dressing/grooming: {ACTIONS;DENIES/REPORTS:21021675::"Denies"} Difficulty climbing stairs: {ACTIONS;DENIES/REPORTS:21021675::"Denies"} Difficulty getting out of chair: {ACTIONS;DENIES/REPORTS:21021675::"Denies"} Difficulty using hands for taps, buttons, cutlery, and/or writing: {ACTIONS;DENIES/REPORTS:21021675::"Denies"}  No Rheumatology ROS completed.   PMFS History:  Patient Active Problem List   Diagnosis Date Noted   Fibromyalgia 10/09/2016   Primary insomnia 10/09/2016   Other fatigue 10/09/2016   Tendinopathy of right shoulder 10/09/2016   Trochanteric bursitis of right hip 10/09/2016   Primary osteoarthritis of both hands 10/09/2016   Chronic kidney disease 10/09/2016   Essential hypertension 10/09/2016   Gastroesophageal reflux disease 10/09/2016   Restless leg syndrome 10/09/2016   Malignant neoplasm of female breast (HCC) 10/09/2016   History of breast cancer 10/09/2016   Genital herpes simplex 10/09/2016    Past Medical History:  Diagnosis Date   Arthritis    Breast cancer (HCC)    Breast cancer (HCC)    Broken toe    CAD (coronary artery disease)    Fibromyalgia    Hypertension    MI (myocardial infarction) (HCC)     Family History  Problem Relation Age of Onset   Lupus Mother    Past Surgical History:  Procedure Laterality Date   ABDOMINAL HYSTERECTOMY     BREAST SURGERY     CESAREAN SECTION     ingrown toenail   03/21/2021   SMALL INTESTINE SURGERY     TONSILLECTOMY     Social History   Social History Narrative   Not on file   Immunization History  Administered Date(s) Administered   Moderna SARS-COV2 Booster Vaccination 05/14/2020   Moderna Sars-Covid-2 Vaccination 07/07/2019, 08/04/2019   PFIZER(Purple Top)SARS-COV-2 Vaccination 03/20/2021     Objective: Vital Signs: There were no vitals taken for this visit.   Physical Exam   Musculoskeletal Exam: ***  CDAI Exam: CDAI Score: -- Patient Global: --; Provider Global: -- Swollen: --; Tender: -- Joint Exam 12/31/2022   No joint exam has been documented for this visit   There is currently no information documented on the homunculus. Go to the Rheumatology activity and complete the homunculus joint exam.  Investigation: No additional findings.  Imaging: No results found.  Recent Labs: Lab Results  Component Value Date   WBC 4.8 09/01/2018   HGB 12.5 09/01/2018   PLT 258 09/01/2018   NA 143 09/01/2018   K 3.7 09/01/2018   CL 105 09/01/2018   CO2 30 09/01/2018   GLUCOSE 95 09/01/2018   BUN 13 09/01/2018   CREATININE 1.01 (H) 09/01/2018   BILITOT 0.6 09/01/2018   ALKPHOS 99 10/15/2016   AST 21 09/01/2018   ALT 16 09/01/2018   PROT 7.1 09/01/2018   ALBUMIN 4.4 10/15/2016   CALCIUM 9.5 09/01/2018   GFRAA 66 09/01/2018    Speciality Comments: No specialty comments available.  Procedures:  No procedures performed Allergies: Codeine, Penicillins, Sulfa antibiotics, Tramadol, Aromasin [exemestane], Atorvastatin, Celexa [citalopram  hydrobromide], Citalopram, and Tamoxifen   Assessment / Plan:     Visit Diagnoses: No diagnosis found.  Orders: No orders of the defined types were placed in this encounter.  No orders of the defined types were placed in this encounter.   Face-to-face time spent with patient was *** minutes. Greater than 50% of time was spent in counseling and coordination of care.  Follow-Up  Instructions: No follow-ups on file.   Ellen Henri, CMA  Note - This record has been created using Animal nutritionist.  Chart creation errors have been sought, but may not always  have been located. Such creation errors do not reflect on  the standard of medical care.

## 2022-12-31 ENCOUNTER — Encounter: Payer: Self-pay | Admitting: Physician Assistant

## 2022-12-31 ENCOUNTER — Ambulatory Visit: Payer: Medicare HMO | Attending: Physician Assistant | Admitting: Physician Assistant

## 2022-12-31 VITALS — BP 130/83 | HR 75 | Resp 12 | Ht 72.0 in | Wt 224.0 lb

## 2022-12-31 DIAGNOSIS — M797 Fibromyalgia: Secondary | ICD-10-CM

## 2022-12-31 DIAGNOSIS — Z8679 Personal history of other diseases of the circulatory system: Secondary | ICD-10-CM

## 2022-12-31 DIAGNOSIS — M5136 Other intervertebral disc degeneration, lumbar region: Secondary | ICD-10-CM

## 2022-12-31 DIAGNOSIS — Z853 Personal history of malignant neoplasm of breast: Secondary | ICD-10-CM

## 2022-12-31 DIAGNOSIS — G2581 Restless legs syndrome: Secondary | ICD-10-CM

## 2022-12-31 DIAGNOSIS — M67911 Unspecified disorder of synovium and tendon, right shoulder: Secondary | ICD-10-CM

## 2022-12-31 DIAGNOSIS — R748 Abnormal levels of other serum enzymes: Secondary | ICD-10-CM

## 2022-12-31 DIAGNOSIS — M19042 Primary osteoarthritis, left hand: Secondary | ICD-10-CM

## 2022-12-31 DIAGNOSIS — M19041 Primary osteoarthritis, right hand: Secondary | ICD-10-CM

## 2022-12-31 DIAGNOSIS — M25462 Effusion, left knee: Secondary | ICD-10-CM

## 2022-12-31 DIAGNOSIS — M7061 Trochanteric bursitis, right hip: Secondary | ICD-10-CM

## 2022-12-31 DIAGNOSIS — R2689 Other abnormalities of gait and mobility: Secondary | ICD-10-CM

## 2022-12-31 DIAGNOSIS — M4156 Other secondary scoliosis, lumbar region: Secondary | ICD-10-CM | POA: Diagnosis not present

## 2022-12-31 DIAGNOSIS — F5101 Primary insomnia: Secondary | ICD-10-CM

## 2022-12-31 DIAGNOSIS — R5383 Other fatigue: Secondary | ICD-10-CM

## 2022-12-31 DIAGNOSIS — M2550 Pain in unspecified joint: Secondary | ICD-10-CM

## 2022-12-31 DIAGNOSIS — I252 Old myocardial infarction: Secondary | ICD-10-CM

## 2022-12-31 DIAGNOSIS — M51369 Other intervertebral disc degeneration, lumbar region without mention of lumbar back pain or lower extremity pain: Secondary | ICD-10-CM

## 2022-12-31 DIAGNOSIS — R251 Tremor, unspecified: Secondary | ICD-10-CM

## 2022-12-31 DIAGNOSIS — Z8719 Personal history of other diseases of the digestive system: Secondary | ICD-10-CM

## 2022-12-31 DIAGNOSIS — M791 Myalgia, unspecified site: Secondary | ICD-10-CM

## 2022-12-31 DIAGNOSIS — M7062 Trochanteric bursitis, left hip: Secondary | ICD-10-CM

## 2022-12-31 MED ORDER — METHOCARBAMOL 500 MG PO TABS: 500.0000 mg | ORAL_TABLET | Freq: Every day | ORAL | 0 refills | Status: DC | PRN

## 2023-01-01 ENCOUNTER — Encounter (HOSPITAL_BASED_OUTPATIENT_CLINIC_OR_DEPARTMENT_OTHER): Payer: Self-pay | Admitting: Pediatrics

## 2023-01-01 ENCOUNTER — Emergency Department (HOSPITAL_BASED_OUTPATIENT_CLINIC_OR_DEPARTMENT_OTHER)
Admission: EM | Admit: 2023-01-01 | Discharge: 2023-01-01 | Disposition: A | Discharge status: Home / Self Care | Payer: Medicare HMO | Attending: Emergency Medicine | Admitting: Emergency Medicine

## 2023-01-01 ENCOUNTER — Telehealth (HOSPITAL_BASED_OUTPATIENT_CLINIC_OR_DEPARTMENT_OTHER): Payer: Self-pay | Admitting: Emergency Medicine

## 2023-01-01 ENCOUNTER — Other Ambulatory Visit: Payer: Self-pay

## 2023-01-01 ENCOUNTER — Emergency Department (HOSPITAL_BASED_OUTPATIENT_CLINIC_OR_DEPARTMENT_OTHER): Payer: Medicare HMO

## 2023-01-01 DIAGNOSIS — Z7982 Long term (current) use of aspirin: Secondary | ICD-10-CM | POA: Diagnosis not present

## 2023-01-01 DIAGNOSIS — I10 Essential (primary) hypertension: Secondary | ICD-10-CM | POA: Diagnosis not present

## 2023-01-01 DIAGNOSIS — E860 Dehydration: Secondary | ICD-10-CM | POA: Insufficient documentation

## 2023-01-01 DIAGNOSIS — I2510 Atherosclerotic heart disease of native coronary artery without angina pectoris: Secondary | ICD-10-CM | POA: Insufficient documentation

## 2023-01-01 DIAGNOSIS — Z853 Personal history of malignant neoplasm of breast: Secondary | ICD-10-CM | POA: Insufficient documentation

## 2023-01-01 DIAGNOSIS — Z7984 Long term (current) use of oral hypoglycemic drugs: Secondary | ICD-10-CM | POA: Diagnosis not present

## 2023-01-01 DIAGNOSIS — R42 Dizziness and giddiness: Secondary | ICD-10-CM | POA: Diagnosis present

## 2023-01-01 DIAGNOSIS — R0602 Shortness of breath: Secondary | ICD-10-CM | POA: Diagnosis not present

## 2023-01-01 DIAGNOSIS — R739 Hyperglycemia, unspecified: Secondary | ICD-10-CM | POA: Insufficient documentation

## 2023-01-01 LAB — COMPREHENSIVE METABOLIC PANEL
ALT: 13 U/L (ref 0–44)
AST: 18 U/L (ref 15–41)
Albumin: 3.9 g/dL (ref 3.5–5.0)
Alkaline Phosphatase: 177 U/L — ABNORMAL HIGH (ref 38–126)
Anion gap: 12 (ref 5–15)
BUN: 12 mg/dL (ref 8–23)
CO2: 23 mmol/L (ref 22–32)
Calcium: 8.5 mg/dL — ABNORMAL LOW (ref 8.9–10.3)
Chloride: 92 mmol/L — ABNORMAL LOW (ref 98–111)
Creatinine, Ser: 1.23 mg/dL — ABNORMAL HIGH (ref 0.44–1.00)
GFR, Estimated: 47 mL/min — ABNORMAL LOW (ref >60)
Glucose, Bld: 605 mg/dL (ref 70–99)
Potassium: 4.4 mmol/L (ref 3.5–5.1)
Sodium: 127 mmol/L — ABNORMAL LOW (ref 135–145)
Total Bilirubin: 0.6 mg/dL (ref 0.3–1.2)
Total Protein: 7.2 g/dL (ref 6.5–8.1)

## 2023-01-01 LAB — I-STAT VENOUS BLOOD GAS, ED
Acid-Base Excess: 1 mmol/L (ref 0.0–2.0)
Bicarbonate: 25.3 mmol/L (ref 20.0–28.0)
Calcium, Ion: 1.12 mmol/L — ABNORMAL LOW (ref 1.15–1.40)
HCT: 39 % (ref 36.0–46.0)
Hemoglobin: 13.3 g/dL (ref 12.0–15.0)
O2 Saturation: 89 %
Potassium: 4.4 mmol/L (ref 3.5–5.1)
Sodium: 130 mmol/L — ABNORMAL LOW (ref 135–145)
TCO2: 26 mmol/L (ref 22–32)
pCO2, Ven: 39.4 mmHg — ABNORMAL LOW (ref 44–60)
pH, Ven: 7.416 (ref 7.25–7.43)
pO2, Ven: 55 mmHg — ABNORMAL HIGH (ref 32–45)

## 2023-01-01 LAB — URINALYSIS, ROUTINE W REFLEX MICROSCOPIC
Bilirubin Urine: NEGATIVE
Glucose, UA: >=500 mg/dL — AB
Hgb urine dipstick: NEGATIVE
Ketones, ur: NEGATIVE mg/dL
Leukocytes,Ua: NEGATIVE
Nitrite: NEGATIVE
Protein, ur: NEGATIVE mg/dL
Specific Gravity, Urine: 1.01 (ref 1.005–1.030)
pH: 5.5 (ref 5.0–8.0)

## 2023-01-01 LAB — CBC WITH DIFFERENTIAL/PLATELET
Abs Immature Granulocytes: 0.01 10*3/uL (ref 0.00–0.07)
Basophils Absolute: 0 10*3/uL (ref 0.0–0.1)
Basophils Relative: 0 %
Eosinophils Absolute: 0.1 10*3/uL (ref 0.0–0.5)
Eosinophils Relative: 1 %
HCT: 37.6 % (ref 36.0–46.0)
Hemoglobin: 13 g/dL (ref 12.0–15.0)
Immature Granulocytes: 0 %
Lymphocytes Relative: 32 %
Lymphs Abs: 1.6 10*3/uL (ref 0.7–4.0)
MCH: 29.4 pg (ref 26.0–34.0)
MCHC: 34.6 g/dL (ref 30.0–36.0)
MCV: 85.1 fL (ref 80.0–100.0)
Monocytes Absolute: 0.4 10*3/uL (ref 0.1–1.0)
Monocytes Relative: 8 %
Neutro Abs: 2.9 10*3/uL (ref 1.7–7.7)
Neutrophils Relative %: 59 %
Platelets: 245 10*3/uL (ref 150–400)
RBC: 4.42 MIL/uL (ref 3.87–5.11)
RDW: 11.9 % (ref 11.5–15.5)
WBC: 4.9 10*3/uL (ref 4.0–10.5)
nRBC: 0 % (ref 0.0–0.2)

## 2023-01-01 LAB — URINALYSIS, MICROSCOPIC (REFLEX)

## 2023-01-01 LAB — TROPONIN I (HIGH SENSITIVITY): Troponin I (High Sensitivity): 2 ng/L (ref <18)

## 2023-01-01 LAB — CBG MONITORING, ED
Glucose-Capillary: >600 mg/dL (ref 70–99)
Glucose-Capillary: 341 mg/dL — ABNORMAL HIGH (ref 70–99)
Glucose-Capillary: 365 mg/dL — ABNORMAL HIGH (ref 70–99)

## 2023-01-01 LAB — BETA-HYDROXYBUTYRIC ACID: Beta-Hydroxybutyric Acid: 0.09 mmol/L (ref 0.05–0.27)

## 2023-01-01 LAB — BRAIN NATRIURETIC PEPTIDE: B Natriuretic Peptide: 12.4 pg/mL (ref 0.0–100.0)

## 2023-01-01 MED ORDER — LACTATED RINGERS IV BOLUS: 1000.0000 mL | Freq: Once | INTRAVENOUS | Status: DC

## 2023-01-01 MED ORDER — METFORMIN HCL 500 MG PO TABS: 500.0000 mg | ORAL_TABLET | Freq: Two times a day (BID) | ORAL | 0 refills | Status: AC

## 2023-01-01 MED ORDER — CEPHALEXIN 500 MG PO CAPS: 500.0000 mg | ORAL_CAPSULE | Freq: Three times a day (TID) | ORAL | 0 refills | Status: AC

## 2023-01-01 MED ORDER — INSULIN ASPART 100 UNIT/ML IJ SOLN
7.0000 [IU] | Freq: Once | INTRAMUSCULAR | Status: AC
  Administered 2023-01-01: 7 [IU] via INTRAVENOUS

## 2023-01-01 MED ORDER — SODIUM CHLORIDE 0.9 % IV SOLN: INTRAVENOUS | Status: DC | PRN

## 2023-01-01 MED ORDER — SODIUM CHLORIDE 0.9 % IV BOLUS
1000.0000 mL | Freq: Once | INTRAVENOUS | Status: AC
  Administered 2023-01-01: 1000 mL via INTRAVENOUS

## 2023-01-01 MED ORDER — METFORMIN HCL 500 MG PO TABS
500.0000 mg | ORAL_TABLET | Freq: Once | ORAL | Status: AC
  Administered 2023-01-01: 500 mg via ORAL
  Filled 2023-01-01: qty 1

## 2023-01-01 MED ORDER — SODIUM CHLORIDE 0.9 % IV SOLN
1.0000 g | Freq: Once | INTRAVENOUS | Status: AC
  Administered 2023-01-01: 1 g via INTRAVENOUS
  Filled 2023-01-01: qty 10

## 2023-01-01 NOTE — Telephone Encounter (Signed)
Keflex sent to pharmacy

## 2023-01-01 NOTE — ED Triage Notes (Signed)
Reports BGL of greater than 600s and is having some light headedness and dizziness.

## 2023-01-01 NOTE — ED Provider Notes (Signed)
[facility] Provider Note   CSN: [id number] Arrival date & time: 01/01/23  1309     History  Chief Complaint  Patient presents with   Dizziness    [facility] is a 72 y.o. female.  HPI      72 year old female with a history of degenerative disc disease, osteoarthritis, fibromyalgia who presents with concern for hyperglycemia found on labs done with rheumatology in setting of 3 weeks of fatigue, lightheadedness, body aches.    She had presented to rheumatology with concern for fatigue, myalgias, fibromyalgia related pain.  Reports that over the 3 to 4 weeks she had had an exacerbation of her symptoms of myalgias, arthralgias, joint stiffness and muscle fatigue.  Glucose was found to be 576 and they called her and left her message to go to the emergency department.   Went yesterday to rheumatologist because feeling weak and dizzy, difficulty getting up and walking,  for about 4 weeks.  Progressing.  Fibromyalgia acting up, having pain all over, legs, arms, feel heavy in the joints, does feel like fibromyalgia exacerbations she has had before.  No chest pain.  Has had some shortness of breath.  Feels like going to pass out.    A little bit thirst, drinking more water, urinating more for a few days  No nausea or vomiting Numbness feet for the past week, no other focal numbness or weakness Saw eye doctor had had cataracts, last week, cataracts were worse. No persistent double vision  No headache, no fevers      Past Medical History:  Diagnosis Date   Arthritis    Breast cancer (HCC)    Breast cancer (HCC)    Broken toe    CAD (coronary artery disease)    Cataract    Fibromyalgia    Hypertension    MI (myocardial infarction) (HCC)      Home Medications Prior to Admission medications   Medication Sig Start Date End Date Taking? Authorizing Provider  cephALEXin (KEFLEX) 500 MG capsule Take 1 capsule (500 mg total) by  mouth 3 (three) times daily for 7 days. 01/01/23 01/08/23  Alvira Monday, MD  metFORMIN (GLUCOPHAGE) 500 MG tablet Take 1 tablet (500 mg total) by mouth 2 (two) times daily with a meal. 01/01/23  Yes Glyn Ade, MD  Accu-Chek Softclix Lancets lancets  07/15/22   [provider]  Alcohol Swabs (DROPSAFE ALCOHOL PREP) 70 % PADS Apply topically. 07/15/22   [provider]  amLODipine (NORVASC) 10 MG tablet Take 10 mg by mouth daily.    [provider]  aspirin 81 MG tablet Take 81 mg by mouth daily.    [provider]  atenolol (TENORMIN) 50 MG tablet Take 50 mg by mouth daily. 07/03/22   [provider]  atorvastatin (LIPITOR) 20 MG tablet SMARTSIG:1 Tablet(s) By Mouth Every Evening 02/13/22   [provider]  Biotin 1 MG CAPS Take by mouth.    [provider]  Blood Glucose Monitoring Suppl (ACCU-CHEK GUIDE ME) w/Device KIT  07/15/22   [provider]  clonazePAM (KLONOPIN) 1 MG tablet TAKE 1 TABLET BY MOUTH AT BEDTIME AS NEEDED 12/28/17   Gearldine Bienenstock, PA-C  co-enzyme Q-10 30 MG capsule Take 30 mg by mouth daily.    [provider]  gabapentin (NEURONTIN) 600 MG tablet TAKE 2 TABLETS BY MOUTH DIVIDED DURING THE DAY AND TAKE 2 TABLETS AT BEDTIME 02/08/19   [provider]  MAGNESIUM CHLORIDE ER PO Take by mouth.    [provider]  methocarbamol (ROBAXIN) 500 MG tablet Take 1 tablet (500 mg total) by mouth daily as needed for muscle spasms. 12/31/22   Gearldine Bienenstock, PA-C  olmesartan (BENICAR) 40 MG tablet Take 40 mg by mouth daily. 02/03/22   [provider]  omeprazole (PRILOSEC) 20 MG capsule Take 20 mg by mouth daily.    [provider]  PRECISION QID TEST test strip  04/01/16   [provider]  Pyridoxine HCl (VITAMIN B6 PO) Take by mouth.    [provider]  traZODone (DESYREL) 150 MG tablet Take by mouth.    [provider]  valACYclovir (VALTREX) 500 MG  tablet Take 500 mg by mouth 2 (two) times daily.    [provider]  venlafaxine XR (EFFEXOR-XR) 150 MG 24 hr capsule TK ONE C PO QAM FOR DEPRESSION 02/16/17   [provider]  vitamin B-12 (CYANOCOBALAMIN) 1000 MCG tablet Take by mouth.    [provider]      Allergies    Codeine, Penicillins, Sulfa antibiotics, Tramadol, Aromasin [exemestane], Atorvastatin, Celexa [citalopram hydrobromide], Citalopram, and Tamoxifen    Review of Systems   Review of Systems  Physical Exam Updated Vital Signs BP (!) 122/59 (BP Location: Left Arm)   Pulse 76   Temp 98 F (36.7 C) (Oral)   Resp 15   Ht 6' (1.829 m)   Wt 101.6 kg   SpO2 96%   BMI 30.38 kg/m  Physical Exam Vitals and nursing note reviewed.  Constitutional:      General: She is not in acute distress.    Appearance: Normal appearance. She is well-developed. She is not ill-appearing or diaphoretic.  HENT:     Head: Normocephalic and atraumatic.  Eyes:     General: No visual field deficit.    Extraocular Movements: Extraocular movements intact.     Conjunctiva/sclera: Conjunctivae normal.     Pupils: Pupils are equal, round, and reactive to light.  Cardiovascular:     Rate and Rhythm: Normal rate and regular rhythm.     Pulses: Normal pulses.     Heart sounds: Normal heart sounds. No murmur heard.    No friction rub. No gallop.  Pulmonary:     Effort: Pulmonary effort is normal. No respiratory distress.     Breath sounds: Normal breath sounds. No wheezing or rales.  Abdominal:     General: There is no distension.     Palpations: Abdomen is soft.     Tenderness: There is no abdominal tenderness. There is no guarding.  Musculoskeletal:        General: No swelling or tenderness.     Cervical back: Normal range of motion.  Skin:    General: Skin is warm and dry.     Findings: No erythema or rash.  Neurological:     General: No focal deficit present.     Mental Status: She is alert and oriented to  person, place, and time.     GCS: GCS eye subscore is 4. GCS verbal subscore is 5. GCS motor subscore is 6.     Cranial Nerves: No cranial nerve deficit, dysarthria or facial asymmetry.     Sensory: No sensory deficit.     Motor: No weakness or tremor.     Coordination: Coordination normal. Finger-Nose-Finger Test normal.     Gait: Gait normal.     ED Results / Procedures / Treatments  Labs (all labs ordered are listed, but only abnormal results are displayed) Labs Reviewed  URINALYSIS, ROUTINE W REFLEX MICROSCOPIC - Abnormal; Notable for the following components:      Result Value   APPearance CLOUDY (*)    Glucose, UA >=500 (*)    All other components within normal limits  COMPREHENSIVE METABOLIC PANEL - Abnormal; Notable for the following components:   Sodium 127 (*)    Chloride 92 (*)    Glucose, Bld 605 (*)    Creatinine, Ser 1.23 (*)    Calcium 8.5 (*)    Alkaline Phosphatase 177 (*)    GFR, Estimated 47 (*)    All other components within normal limits  URINALYSIS, MICROSCOPIC (REFLEX) - Abnormal; Notable for the following components:   Bacteria, UA MANY (*)    All other components within normal limits  CBG MONITORING, ED - Abnormal; Notable for the following components:   Glucose-Capillary >600 (*)    All other components within normal limits  I-STAT VENOUS BLOOD GAS, ED - Abnormal; Notable for the following components:   pCO2, Ven 39.4 (*)    pO2, Ven 55 (*)    Sodium 130 (*)    Calcium, Ion 1.12 (*)    All other components within normal limits  CBG MONITORING, ED - Abnormal; Notable for the following components:   Glucose-Capillary 341 (*)    All other components within normal limits  CBG MONITORING, ED - Abnormal; Notable for the following components:   Glucose-Capillary 365 (*)    All other components within normal limits  URINE CULTURE  CBC WITH DIFFERENTIAL/PLATELET  BETA-HYDROXYBUTYRIC ACID  BRAIN NATRIURETIC PEPTIDE  TROPONIN I (HIGH SENSITIVITY)     EKG EKG Interpretation Date/Time:  Thursday January 01 2023 14:00:11 EDT Ventricular Rate:  79 PR Interval:  188 QRS Duration:  80 QT Interval:  385 QTC Calculation: 442 R Axis:   5  Text Interpretation: Sinus rhythm Abnormal R-wave progression, early transition Left ventricular hypertrophy Borderline ST elevation, lateral leads No previous ECGs available Confirmed by Alvira Monday ([id number]) on 01/01/2023 2:58:38 PM  Radiology DG Chest 2 View  Result Date: 01/01/2023 CLINICAL DATA:  Shortness of breath EXAM: CHEST - 2 VIEW COMPARISON:  Chest x-ray dated September 04, 2021 FINDINGS: Heart size and mediastinal contours are within normal limits. Both lungs are clear. The visualized skeletal structures are unremarkable. IMPRESSION: No active cardiopulmonary disease. Electronically Signed   By: Allegra Lai M.D.   On: 01/01/2023 15:40    Procedures Procedures    Medications Ordered in ED Medications  0.9 %  sodium chloride infusion (0 mLs Intravenous Stopped 01/01/23 1707)  insulin aspart (novoLOG) injection 7 Units (7 Units Intravenous Given 01/01/23 1600)  cefTRIAXone (ROCEPHIN) 1 g in sodium chloride 0.9 % 100 mL IVPB (0 g Intravenous Stopped 01/01/23 1638)  sodium chloride 0.9 % bolus 1,000 mL (0 mLs Intravenous Stopped 01/01/23 1705)  sodium chloride 0.9 % bolus 1,000 mL (0 mLs Intravenous Stopped 01/01/23 1816)  metFORMIN (GLUCOPHAGE) tablet 500 mg (500 mg Oral Given 01/01/23 1834)    ED Course/ Medical Decision Making/ A&P Clinical Course as of 01/01/23 2315  Thu Jan 01, 2023  1618 Blood Glucose and symptom check at 1700 [CC]    Clinical Course User Index [CC] Glyn Ade, MD                              72 year old female with a history of  degenerative disc disease, osteoarthritis, fibromyalgia who presents with concern for hyperglycemia found on labs done with rheumatology in setting of 3 weeks of fatigue, lightheadedness, body aches.   Labs completed and personally  evaluated and interpreted by me shows hyperglycemia, normal pH, normal bicarb, no AG, no ketones, no sign of DKA.  No clinically significant anemia, nor leukocytosis.  Exam without focal neurologic abnormalities, doubt CVA. Suspect dizziness likely lightheadedness in setting of dehydration from hyperglycemia.  Troponin, CXR pending.   Ordered dose of rocephin for UTI, fluid and insulin.    Discussed will reevaluate in setting of new onset DM and hyperglycemia as well as symptoms.          Final Clinical Impression(s) / ED Diagnoses Final diagnoses:  Dizziness  Dehydration  Hyperglycemia    Rx / DC Orders ED Discharge Orders          Ordered    metFORMIN (GLUCOPHAGE) 500 MG tablet  2 times daily with meals        01/01/23 1829              Alvira Monday, MD 01/01/23 2316

## 2023-01-01 NOTE — Progress Notes (Signed)
I received a call from [facility] regarding a critical lab value--glucose 579--attempted to call the patient but had to leave a voicemail. Plan to advise patient to go to ED.  Creatinine is elevated-1.10 and GFR is low-53.  Alk phos is elevated-202.  Please add isoenzymes.  LFTs WNL.  CBC WNL ESR WNL TSH WNL CK WNL RF negative

## 2023-01-01 NOTE — ED Provider Notes (Signed)
Care of patient received from prior provider at 6:30 PM, please see their note for complete H/P and care plan.  Received handoff per ED course.  Clinical Course as of 01/01/23 1830  Thu Jan 01, 2023  1618 Blood Glucose and symptom check at 1700 [CC]    Clinical Course User Index [CC] Glyn Ade, MD    Reassessment: Reassessed at bedside.  Blood sugar has decreased by approximately 50% from initial.  IV fluids given patient grossly symptomatically resolved.  Denies fevers chills nausea vomiting syncope shortness of breath.  No ambulatory tolerating p.o. intake.  Offered admission but patient felt comfortable with outpatient care and management.  Given lack of any acute symptoms and duration of her current symptoms, this is likely a chronic hyperglycemia.  Will start patient on metformin however follow-up with PCP in a.m.  Stated that she already had an appointment. Patient discharged with no further acute events with strict return precautions regarding interval worsening recurrence of symptoms.  Disposition:  I have considered need for hospitalization, however, considering all of the above, I believe this patient is stable for discharge at this time.  Patient/family educated about specific return precautions for given chief complaint and symptoms.  Patient/family educated about follow-up with PCP.     Patient/family expressed understanding of return precautions and need for follow-up. Patient spoken to regarding all imaging and laboratory results and appropriate follow up for these results. All education provided in verbal form with additional information in written form. Time was allowed for answering of patient questions. Patient discharged.    Emergency Department Medication Summary:   Medications  0.9 %  sodium chloride infusion (0 mLs Intravenous Stopped 01/01/23 1707)  metFORMIN (GLUCOPHAGE) tablet 500 mg (has no administration in time range)  insulin aspart (novoLOG) injection  7 Units (7 Units Intravenous Given 01/01/23 1600)  cefTRIAXone (ROCEPHIN) 1 g in sodium chloride 0.9 % 100 mL IVPB (0 g Intravenous Stopped 01/01/23 1638)  sodium chloride 0.9 % bolus 1,000 mL (0 mLs Intravenous Stopped 01/01/23 1705)  sodium chloride 0.9 % bolus 1,000 mL (0 mLs Intravenous Stopped 01/01/23 1816)           Glyn Ade, MD 01/01/23 1830

## 2023-01-02 ENCOUNTER — Telehealth: Payer: Self-pay | Admitting: Physician Assistant

## 2023-01-02 LAB — URINE CULTURE: Culture: <10000 — AB

## 2023-01-02 NOTE — Telephone Encounter (Signed)
Late entry:  I spoke with the patient at 12:16 pm yesterday and again at 12:36 pm.  I was able to discuss the patient's lab results including the critical lab value of an elevated glucose at 579.  At that time the patient was advised to recheck her blood glucose with her home monitor.  The patient called me back to report that her monitor read as high which meant that her blood glucose was over 600.  The patient was advised to go directly to the emergency department. I reviewed the ED notes from her encounter.  She was started on metformin and discharged home with clear instructions.  Patient will be seen by her PCP today.

## 2023-01-02 NOTE — Progress Notes (Signed)
Patient can have alk phos rechecked at PCP appointment

## 2023-01-04 NOTE — Progress Notes (Signed)
Anti-CCP negative.

## 2023-01-05 LAB — RHEUMATOID FACTOR: Rheumatoid fact SerPl-aCnc: <10 IU/mL (ref <14)

## 2023-01-05 LAB — COMPLETE METABOLIC PANEL WITH GFR
AG Ratio: 1.5 (calc) (ref 1.0–2.5)
ALT: 11 U/L (ref 6–29)
AST: 11 U/L (ref 10–35)
Albumin: 4.5 g/dL (ref 3.6–5.1)
Alkaline phosphatase (APISO): 202 U/L — ABNORMAL HIGH (ref 37–153)
BUN/Creatinine Ratio: 10 (calc) (ref 6–22)
BUN: 11 mg/dL (ref 7–25)
CO2: 27 mmol/L (ref 20–32)
Calcium: 9.7 mg/dL (ref 8.6–10.4)
Chloride: 95 mmol/L — ABNORMAL LOW (ref 98–110)
Creat: 1.1 mg/dL — ABNORMAL HIGH (ref 0.60–1.00)
Globulin: 3 g/dL (calc) (ref 1.9–3.7)
Glucose, Bld: 579 mg/dL (ref 65–99)
Potassium: 4.9 mmol/L (ref 3.5–5.3)
Sodium: 132 mmol/L — ABNORMAL LOW (ref 135–146)
Total Bilirubin: 0.5 mg/dL (ref 0.2–1.2)
Total Protein: 7.5 g/dL (ref 6.1–8.1)
eGFR: 53 mL/min/{1.73_m2} — ABNORMAL LOW (ref >=60)

## 2023-01-05 LAB — TSH: TSH: 2.09 mIU/L (ref 0.40–4.50)

## 2023-01-05 LAB — CBC WITH DIFFERENTIAL/PLATELET
Absolute Monocytes: 290 cells/uL (ref 200–950)
Basophils Absolute: 22 cells/uL (ref 0–200)
Basophils Relative: 0.5 %
Eosinophils Absolute: 31 cells/uL (ref 15–500)
Eosinophils Relative: 0.7 %
HCT: 42.7 % (ref 35.0–45.0)
Hemoglobin: 14 g/dL (ref 11.7–15.5)
Lymphs Abs: 1659 cells/uL (ref 850–3900)
MCH: 29.5 pg (ref 27.0–33.0)
MCHC: 32.8 g/dL (ref 32.0–36.0)
MCV: 90.1 fL (ref 80.0–100.0)
MPV: 11.2 fL (ref 7.5–12.5)
Monocytes Relative: 6.6 %
Neutro Abs: 2398 cells/uL (ref 1500–7800)
Neutrophils Relative %: 54.5 %
Platelets: 248 10*3/uL (ref 140–400)
RBC: 4.74 10*6/uL (ref 3.80–5.10)
RDW: 12.3 % (ref 11.0–15.0)
Total Lymphocyte: 37.7 %
WBC: 4.4 10*3/uL (ref 3.8–10.8)

## 2023-01-05 LAB — CYCLIC CITRUL PEPTIDE ANTIBODY, IGG: Cyclic Citrullin Peptide Ab: <16 UNITS

## 2023-01-05 LAB — ALDOLASE: Aldolase: 3.5 U/L (ref <=8.1)

## 2023-01-05 LAB — SEDIMENTATION RATE: Sed Rate: 25 mm/h (ref 0–30)

## 2023-01-05 LAB — CK: Total CK: 72 U/L (ref 29–143)

## 2023-01-05 NOTE — Progress Notes (Signed)
Aldolase WNL.

## 2023-02-24 ENCOUNTER — Ambulatory Visit: Payer: Medicare HMO | Admitting: Physician Assistant

## 2023-10-24 ENCOUNTER — Other Ambulatory Visit: Payer: Self-pay

## 2023-10-24 ENCOUNTER — Emergency Department (HOSPITAL_BASED_OUTPATIENT_CLINIC_OR_DEPARTMENT_OTHER)

## 2023-10-24 ENCOUNTER — Encounter (HOSPITAL_BASED_OUTPATIENT_CLINIC_OR_DEPARTMENT_OTHER): Payer: Self-pay | Admitting: Emergency Medicine

## 2023-10-24 ENCOUNTER — Emergency Department (HOSPITAL_BASED_OUTPATIENT_CLINIC_OR_DEPARTMENT_OTHER)
Admission: EM | Admit: 2023-10-24 | Discharge: 2023-10-24 | Disposition: A | Discharge status: Home / Self Care | Attending: Emergency Medicine | Admitting: Emergency Medicine

## 2023-10-24 DIAGNOSIS — Z79899 Other long term (current) drug therapy: Secondary | ICD-10-CM | POA: Diagnosis not present

## 2023-10-24 DIAGNOSIS — K611 Rectal abscess: Secondary | ICD-10-CM | POA: Diagnosis present

## 2023-10-24 DIAGNOSIS — I129 Hypertensive chronic kidney disease with stage 1 through stage 4 chronic kidney disease, or unspecified chronic kidney disease: Secondary | ICD-10-CM | POA: Diagnosis not present

## 2023-10-24 DIAGNOSIS — Z7982 Long term (current) use of aspirin: Secondary | ICD-10-CM | POA: Insufficient documentation

## 2023-10-24 DIAGNOSIS — N189 Chronic kidney disease, unspecified: Secondary | ICD-10-CM | POA: Diagnosis not present

## 2023-10-24 DIAGNOSIS — L0291 Cutaneous abscess, unspecified: Secondary | ICD-10-CM

## 2023-10-24 DIAGNOSIS — E1122 Type 2 diabetes mellitus with diabetic chronic kidney disease: Secondary | ICD-10-CM | POA: Insufficient documentation

## 2023-10-24 HISTORY — DX: Type 2 diabetes mellitus without complications: E11.9

## 2023-10-24 LAB — COMPREHENSIVE METABOLIC PANEL WITH GFR
ALT: 10 U/L (ref 0–44)
AST: 16 U/L (ref 15–41)
Albumin: 4.5 g/dL (ref 3.5–5.0)
Alkaline Phosphatase: 109 U/L (ref 38–126)
Anion gap: 15 (ref 5–15)
BUN: 21 mg/dL (ref 8–23)
CO2: 25 mmol/L (ref 22–32)
Calcium: 9.9 mg/dL (ref 8.9–10.3)
Chloride: 102 mmol/L (ref 98–111)
Creatinine, Ser: 1.19 mg/dL — ABNORMAL HIGH (ref 0.44–1.00)
GFR, Estimated: 48 mL/min — ABNORMAL LOW (ref >60)
Glucose, Bld: 86 mg/dL (ref 70–99)
Potassium: 4 mmol/L (ref 3.5–5.1)
Sodium: 143 mmol/L (ref 135–145)
Total Bilirubin: 0.3 mg/dL (ref 0.0–1.2)
Total Protein: 7.5 g/dL (ref 6.5–8.1)

## 2023-10-24 LAB — CBC WITH DIFFERENTIAL/PLATELET
Abs Immature Granulocytes: 0.01 10*3/uL (ref 0.00–0.07)
Basophils Absolute: 0 10*3/uL (ref 0.0–0.1)
Basophils Relative: 1 %
Eosinophils Absolute: 0.2 10*3/uL (ref 0.0–0.5)
Eosinophils Relative: 4 %
HCT: 35.5 % — ABNORMAL LOW (ref 36.0–46.0)
Hemoglobin: 12 g/dL (ref 12.0–15.0)
Immature Granulocytes: 0 %
Lymphocytes Relative: 40 %
Lymphs Abs: 2.3 10*3/uL (ref 0.7–4.0)
MCH: 30.5 pg (ref 26.0–34.0)
MCHC: 33.8 g/dL (ref 30.0–36.0)
MCV: 90.1 fL (ref 80.0–100.0)
Monocytes Absolute: 0.5 10*3/uL (ref 0.1–1.0)
Monocytes Relative: 8 %
Neutro Abs: 2.8 10*3/uL (ref 1.7–7.7)
Neutrophils Relative %: 47 %
Platelets: 253 10*3/uL (ref 150–400)
RBC: 3.94 MIL/uL (ref 3.87–5.11)
RDW: 12.8 % (ref 11.5–15.5)
WBC: 5.9 10*3/uL (ref 4.0–10.5)
nRBC: 0 % (ref 0.0–0.2)

## 2023-10-24 LAB — LACTIC ACID, PLASMA: Lactic Acid, Venous: 1.8 mmol/L (ref 0.5–1.9)

## 2023-10-24 MED ORDER — LIDOCAINE-EPINEPHRINE (PF) 2 %-1:200000 IJ SOLN
10.0000 mL | Freq: Once | INTRAMUSCULAR | Status: DC
  Filled 2023-10-24: qty 20

## 2023-10-24 MED ORDER — IOHEXOL 300 MG/ML  SOLN
100.0000 mL | Freq: Once | INTRAMUSCULAR | Status: AC | PRN
  Administered 2023-10-24: 100 mL via INTRAVENOUS

## 2023-10-24 NOTE — ED Triage Notes (Signed)
 Pt reports perineal abscess x 2 wks, no drainage

## 2023-10-24 NOTE — Discharge Instructions (Addendum)
 You were seen in the emergency department for a skin abscess There was no white count or other signs of severe infection The CAT scan did not show a deeper abscess Upon physical exam it appears as though the abscess is much smaller than it was earlier We decided not to do an incision and drainage at this time as it appears to be improving You should follow-up with your primary care doctor in 1 week for reevaluation Return to the emergency department if this area is severely uncomfortable or you develop fevers Continue doing Epsom salt baths

## 2023-10-24 NOTE — ED Provider Notes (Signed)
 [place] [facility] AT [facility] Provider Note   CSN: [id number] Arrival date & time: 10/24/23  1618     History  Chief Complaint  Patient presents with   Abscess    [facility] is a 73 y.o. female.  With a history of type 2 diabetes, hypertension and CKD who presents to the ED for an anorectal abscess.  Patient first experienced discomfort perianal region around 2 weeks ago that is persistent to the onset.  She tried topical treatment for suspected hemorrhoids but this did not help.  She has not had any drainage from the area.  She has not experienced fevers chills nausea vomiting or abdominal pain.  No urinary symptoms.  No prior history of skin abscesses or MRSA   Abscess      Home Medications Prior to Admission medications   Medication Sig Start Date End Date Taking? Authorizing Provider  Accu-Chek Softclix Lancets lancets  07/15/22   [provider]  Alcohol Swabs (DROPSAFE ALCOHOL PREP) 70 % PADS Apply topically. 07/15/22   [provider]  amLODipine (NORVASC) 10 MG tablet Take 10 mg by mouth daily.    [provider]  aspirin 81 MG tablet Take 81 mg by mouth daily.    [provider]  atenolol (TENORMIN) 50 MG tablet Take 50 mg by mouth daily. 07/03/22   [provider]  atorvastatin (LIPITOR) 20 MG tablet SMARTSIG:1 Tablet(s) By Mouth Every Evening 02/13/22   [provider]  Biotin 1 MG CAPS Take by mouth.    [provider]  Blood Glucose Monitoring Suppl (ACCU-CHEK GUIDE ME) w/Device KIT  07/15/22   [provider]  clonazePAM  (KLONOPIN ) 1 MG tablet TAKE 1 TABLET BY MOUTH AT BEDTIME AS NEEDED 12/28/17   Romayne Clubs, PA-C  co-enzyme Q-10 30 MG capsule Take 30 mg by mouth daily.    [provider]  gabapentin (NEURONTIN) 600 MG tablet TAKE 2 TABLETS BY MOUTH DIVIDED DURING THE DAY AND TAKE 2 TABLETS AT BEDTIME 02/08/19   [provider]  MAGNESIUM CHLORIDE  ER PO Take by mouth.    [provider]  metFORMIN  (GLUCOPHAGE ) 500 MG tablet Take 1 tablet (500 mg total) by mouth 2 (two) times daily with a meal. 01/01/23   Onetha Bile, MD  methocarbamol  (ROBAXIN ) 500 MG tablet Take 1 tablet (500 mg total) by mouth daily as needed for muscle spasms. 12/31/22   Romayne Clubs, PA-C  olmesartan (BENICAR) 40 MG tablet Take 40 mg by mouth daily. 02/03/22   [provider]  omeprazole (PRILOSEC) 20 MG capsule Take 20 mg by mouth daily.    [provider]  PRECISION QID TEST test strip  04/01/16   [provider]  Pyridoxine HCl (VITAMIN B6 PO) Take by mouth.    [provider]  traZODone (DESYREL) 150 MG tablet Take by mouth.    [provider]  valACYclovir (VALTREX) 500 MG tablet Take 500 mg by mouth 2 (two) times daily.    [provider]  venlafaxine XR (EFFEXOR-XR) 150 MG 24 hr capsule TK ONE C PO QAM FOR DEPRESSION 02/16/17   [provider]  vitamin B-12 (CYANOCOBALAMIN) 1000 MCG tablet Take by mouth.    [provider]      Allergies    Codeine, Penicillins, Sulfa antibiotics, Tramadol, Aromasin [exemestane], Atorvastatin, Celexa [citalopram hydrobromide], Citalopram, and Tamoxifen    Review of Systems   Review of Systems  Physical Exam Updated  Vital Signs BP (!) 144/70   Pulse 70   Temp 98.2 F (36.8 C) (Oral)   Resp 18   Ht 6' (1.829 m)   Wt 88.9 kg   SpO2 99%   BMI 26.58 kg/m  Physical Exam Vitals and nursing note reviewed.  HENT:     Head: Normocephalic and atraumatic.  Eyes:     Pupils: Pupils are equal, round, and reactive to light.  Cardiovascular:     Rate and Rhythm: Normal rate and regular rhythm.  Pulmonary:     Effort: Pulmonary effort is normal.     Breath sounds: Normal breath sounds.  Abdominal:     Palpations: Abdomen is soft.     Tenderness: There is no abdominal tenderness.  Genitourinary:    Vagina: No vaginal discharge.     Rectum:  Normal.     Comments: Approximately 0.5 round tender circular raised area in the 6 o'clock position nonfluctuant No erythema or drainage No external hemorrhoids Skin:    General: Skin is warm and dry.  Neurological:     Mental Status: She is alert.  Psychiatric:        Mood and Affect: Mood normal.     ED Results / Procedures / Treatments   Labs (all labs ordered are listed, but only abnormal results are displayed) Labs Reviewed  COMPREHENSIVE METABOLIC PANEL WITH GFR - Abnormal; Notable for the following components:      Result Value   Creatinine, Ser 1.19 (*)    GFR, Estimated 48 (*)    All other components within normal limits  CBC WITH DIFFERENTIAL/PLATELET - Abnormal; Notable for the following components:   HCT 35.5 (*)    All other components within normal limits  CULTURE, BLOOD (ROUTINE X 2)  CULTURE, BLOOD (ROUTINE X 2)  LACTIC ACID, PLASMA  LACTIC ACID, PLASMA    EKG None  Radiology CT ABDOMEN PELVIS W CONTRAST Result Date: 10/24/2023 CLINICAL DATA:  Perianal abscess for 2 weeks EXAM: CT ABDOMEN AND PELVIS WITH CONTRAST TECHNIQUE: Multidetector CT imaging of the abdomen and pelvis was performed using the standard protocol following bolus administration of intravenous contrast. RADIATION DOSE REDUCTION: This exam was performed according to the departmental dose-optimization program which includes automated exposure control, adjustment of the mA and/or kV according to patient size and/or use of iterative reconstruction technique. CONTRAST:  100mL OMNIPAQUE IOHEXOL 300 MG/ML  SOLN COMPARISON:  None Available. FINDINGS: Lower chest: No acute pleural or parenchymal lung disease. Hepatobiliary: No focal liver abnormality is seen. No gallstones, gallbladder wall thickening, or biliary dilatation. Pancreas: Unremarkable. No pancreatic ductal dilatation or surrounding inflammatory changes. Spleen: Normal in size without focal abnormality. Adrenals/Urinary Tract: Adrenal glands are  unremarkable. Kidneys are normal, without renal calculi, focal lesion, or hydronephrosis. Bladder is unremarkable. Stomach/Bowel: No bowel obstruction or ileus. Normal appendix central lower abdomen. No bowel wall thickening or inflammatory change. Specifically, no evidence of perianal abscess or fistula. Vascular/Lymphatic: No significant vascular findings are present. No enlarged abdominal or pelvic lymph nodes. Reproductive: Status post hysterectomy. No adnexal masses. Other: No free fluid or free intraperitoneal gas. No abdominal wall hernia. Musculoskeletal: No acute or destructive bony abnormalities. Reconstructed images demonstrate no additional findings. IMPRESSION: 1. No acute intra-abdominal or intrapelvic process. Specifically, no evidence of perianal or perirectal abscess. Electronically Signed   By: Bobbye Burrow M.D.   On: 10/24/2023 22:12    Procedures Procedures    Medications Ordered in ED Medications  lidocaine -EPINEPHrine (XYLOCAINE  W/EPI) 2 %-1:200000 (PF) injection  10 mL (has no administration in time range)  iohexol (OMNIPAQUE) 300 MG/ML solution 100 mL (100 mLs Intravenous Contrast Given 10/24/23 2024)    ED Course/ Medical Decision Making/ A&P Clinical Course as of 10/24/23 2342  Sat Oct 24, 2023  2338 No leukocytosis or other significant laboratory abnormality.  CT abdomen pelvis shows no perianal perirectal abscess.  A second physical exam was performed with female chaperone (PA Aldine) present with the patient in stirrups.  The small raised lesion just anterior to anus not involving labia.  Slightly tender to palpation but again no fluctuance.  Patient states the lesion is much smaller and it was a couple days ago.  No indication for incision and drainage or antibiotics at this time given her improvement.  Counseled patient on continued symptomatic management with Epsom salt baths and return precautions were discussed in detail.  She will follow-up with her PCP [MP]     Clinical Course User Index [MP] Sallyanne Creamer, DO                                 Medical Decision Making 73 year old female with history as above presenting for anal rectal abscess she first noticed 2 weeks ago.  No systemic signs of illness.  Small raised lesion in the 6 o'clock position.  She has no history of this.  Will need CT with IV contrast to see if this is a simple perianal abscess versus a deeper tracking perirectal abscess.  No evidence of 20 years gangrene.  Will obtain laboratory workup CT and continue to monitor  Amount and/or Complexity of Data Reviewed Labs: ordered. Radiology: ordered.  Risk Prescription drug management.           Final Clinical Impression(s) / ED Diagnoses Final diagnoses:  Abscess    Rx / DC Orders ED Discharge Orders     None         Sallyanne Creamer, DO 10/24/23 2342

## 2023-10-24 NOTE — ED Notes (Signed)
 Patient transported to CT

## 2023-10-25 LAB — LACTIC ACID, PLASMA: Lactic Acid, Venous: 1.6 mmol/L (ref 0.5–1.9)

## 2023-10-30 LAB — CULTURE, BLOOD (ROUTINE X 2)
Culture: NO GROWTH
Culture: NO GROWTH

## 2024-01-12 NOTE — Progress Notes (Signed)
 Office Visit Note  Patient: Whitney Ward             Date of Birth: Jan 19, 1951           MRN: [id number]             PCP: Charlette Erla DELENA, MD Referring: Charlette Lunger* Visit Date: 01/18/2024 Occupation: @GUAROCC @  Subjective:  Right hip pain   History of Present Illness: Whitney Ward is a 73 y.o. female with history of osteoarthritis and fibromyalgia.  Patient presents today with increased pain in the right hip.  Patient had an x-ray of the right hip obtained by her PCP on 12/29/2023.  Patient brought a copy of the x-ray interpretation which revealed mild osteoarthritis.  Patient was started on Celebrex  200 mg 1 capsule twice daily as needed for symptomatic relief.  She is only been taking Celebrex  once daily.  She has noticed some improvement since initiating Celebrex  but continues to have intermittent sharp pain in the right hip to the point she has difficulty ambulating at times.  She also continues to have chronic pain in both feet due to neuropathy especially at night.  She has been taking gabapentin 600 mg in the morning and 1200 mg at night.  She continues to have intermittent myalgais and muscle tenderness due to fibromyalgia.  She takes robaxin  500 mg 1 tablet as needed for muscle spasms.  She requested a refill of robaxin  to be sent to the pharmacy today.        Activities of Daily Living:  Patient reports morning stiffness for 1 hour.   Patient Reports nocturnal pain.  Difficulty dressing/grooming: Denies Difficulty climbing stairs: Reports Difficulty getting out of chair: Denies Difficulty using hands for taps, buttons, cutlery, and/or writing: Denies  Review of Systems  Constitutional:  Positive for fatigue.  HENT:  Negative for mouth sores and mouth dryness.   Eyes:  Negative for dryness.  Respiratory:  Positive for shortness of breath.   Cardiovascular:  Negative for chest pain and palpitations.  Gastrointestinal:  Positive for  constipation. Negative for blood in stool and diarrhea.  Endocrine: Negative for increased urination.  Genitourinary:  Negative for involuntary urination.  Musculoskeletal:  Positive for joint pain, joint pain, myalgias, muscle weakness, morning stiffness, muscle tenderness and myalgias. Negative for gait problem and joint swelling.  Skin:  Negative for color change, rash, hair loss and sensitivity to sunlight.  Allergic/Immunologic: Negative for susceptible to infections.  Neurological:  Negative for dizziness and headaches.  Hematological:  Negative for swollen glands.  Psychiatric/Behavioral:  Positive for depressed mood. Negative for sleep disturbance. The patient is not nervous/anxious.     PMFS History:  Patient Active Problem List   Diagnosis Date Noted   Fibromyalgia 10/09/2016   Primary insomnia 10/09/2016   Other fatigue 10/09/2016   Tendinopathy of right shoulder 10/09/2016   Trochanteric bursitis of right hip 10/09/2016   Primary osteoarthritis of both hands 10/09/2016   Chronic kidney disease 10/09/2016   Essential hypertension 10/09/2016   Gastroesophageal reflux disease 10/09/2016   Restless leg syndrome 10/09/2016   Malignant neoplasm of female breast (HCC) 10/09/2016   History of breast cancer 10/09/2016   Genital herpes simplex 10/09/2016    Past Medical History:  Diagnosis Date   Arthritis    Breast cancer (HCC)    Breast cancer (HCC)    Broken toe    CAD (coronary artery disease)    Cataract    Diabetes mellitus without complication (HCC)  Fibromyalgia    Hypertension    MI (myocardial infarction) (HCC)     Family History  Problem Relation Age of Onset   Lupus Mother    Past Surgical History:  Procedure Laterality Date   ABDOMINAL HYSTERECTOMY     BREAST SURGERY     CESAREAN SECTION     ingrown toenail  03/21/2021   SMALL INTESTINE SURGERY     TONSILLECTOMY     Social History   Social History Narrative   Not on file   Immunization  History  Administered Date(s) Administered   Moderna SARS-COV2 Booster Vaccination 05/14/2020   Moderna Sars-Covid-2 Vaccination 07/07/2019, 08/04/2019   PFIZER(Purple Top)SARS-COV-2 Vaccination 03/20/2021     Objective: Vital Signs: BP 107/72 (BP Location: Right Arm, Patient Position: Sitting, Cuff Size: Normal)   Pulse 79   Resp 16   Ht 6' (1.829 m)   Wt 201 lb (91.2 kg)   BMI 27.26 kg/m    Physical Exam Vitals and nursing note reviewed.  Constitutional:      Appearance: She is well-developed.  HENT:     Head: Normocephalic and atraumatic.  Eyes:     Conjunctiva/sclera: Conjunctivae normal.  Cardiovascular:     Rate and Rhythm: Normal rate and regular rhythm.     Heart sounds: Normal heart sounds.  Pulmonary:     Effort: Pulmonary effort is normal.     Breath sounds: Normal breath sounds.  Abdominal:     General: Bowel sounds are normal.     Palpations: Abdomen is soft.  Musculoskeletal:     Cervical back: Normal range of motion.  Lymphadenopathy:     Cervical: No cervical adenopathy.  Skin:    General: Skin is warm and dry.     Capillary Refill: Capillary refill takes less than 2 seconds.  Neurological:     Mental Status: She is alert and oriented to person, place, and time.  Psychiatric:        Behavior: Behavior normal.      Musculoskeletal Exam: Generalized hyperalgesia and positive tender points noted.  C-spine, thoracic spine, lumbar spine good range of motion.  No SI joint tenderness upon palpation.  Shoulder joints, elbow joints, wrist joints, MCPs and PIPs and DIPs of the range of motion with no synovitis.  Complete fist formation bilaterally.  Limited ROM of the right hip with discomfort.  Left hip has slightly limited ROM but no groin pain currently.  Knee joints have good ROM with no warmth or effusion.  Ankle joints have good ROM with no tenderness or joint swelling.     CDAI Exam: CDAI Score: -- Patient Global: --; Provider Global: -- Swollen: --;  Tender: -- Joint Exam 01/18/2024   No joint exam has been documented for this visit   There is currently no information documented on the homunculus. Go to the Rheumatology activity and complete the homunculus joint exam.  Investigation: No additional findings.  Imaging: No results found.  Recent Labs: Lab Results  Component Value Date   WBC 5.9 10/24/2023   HGB 12.0 10/24/2023   PLT 253 10/24/2023   NA 143 10/24/2023   K 4.0 10/24/2023   CL 102 10/24/2023   CO2 25 10/24/2023   GLUCOSE 86 10/24/2023   BUN 21 10/24/2023   CREATININE 1.19 (H) 10/24/2023   BILITOT 0.3 10/24/2023   ALKPHOS 109 10/24/2023   AST 16 10/24/2023   ALT 10 10/24/2023   PROT 7.5 10/24/2023   ALBUMIN 4.5 10/24/2023   CALCIUM 9.9  10/24/2023   GFRAA 66 09/01/2018    Speciality Comments: No specialty comments available.  Procedures:  No procedures performed Allergies: Codeine, Penicillins, Sulfa antibiotics, Tramadol, Aromasin [exemestane], Atorvastatin, Celexa [citalopram hydrobromide], Citalopram, and Tamoxifen   Assessment / Plan:     Visit Diagnoses: Primary osteoarthritis of both hands: No tenderness or synovitis noted.  Complete fist formation bilaterally.  Discussed the importance of joint protection and muscle strengthening.  Trochanteric bursitis of both hips: Not currently symptomatic.  She presented today with right hip pain manifesting as groin pain.  She had an x-ray of the right hip obtained on 12/29/2023 which revealed mild osteoarthritis.  Offered to place a referral to orthopedics but she has declined at this time.  Primary osteoarthritis of right hip: X-rays of the right hip obtained on 12/29/2023: Mild arthritic change in the form of joint space narrowing noted.  No fracture, dislocation, loose bodies, lytic or blastic lesions.  No AVN.  She was started on Celebrex  by her PCP.  The prescription was sent for Celebrex  200 mg 1 capsule twice daily as needed for pain relief.  She has been  taking Celebrex  once daily which has helped to alleviate her discomfort but she continues to have sharp pains in the right hip intermittently.  At times she has difficulty ambulating due to severity of pain.  Different treatment options were discussed today including proceeding with an intra-articular hip injection in the future.  Also discussed the option of physical therapy. Offered to place referral to orthopedics.  She plans on following back up with her PCP.  Other secondary scoliosis, lumbar region - X-ray in 02/2021 revealed scoliosis of the lumbar spine.  No symptoms of radiculopathy at this time.  She experiences muscle spasms intermittently.  A prescription for methocarbamol  500 mg 1 tablet daily muscle spasms was sent to the pharmacy today.  Tendinopathy of right shoulder: Intermittent discomfort.  She has some tenderness over the right shoulder.  Swelling of left knee joint: No effusion noted.  Fibromyalgia -She continues to have generalized hyperalgesia and positive tender points on exam.  She takes methocarbamol  500 mg 1 tablet daily as needed for muscle spasms.  Refill sent to the pharmacy today.  She remains on Cymbalta and gabapentin as prescribed.  Primary insomnia: She takes trazodone 150 mg at bedtime for insomnia.   Other fatigue: Chronic, stable.   Balance problems: She experiences some issues with balance likely related to neuropathy involving both feet.  Elevated CK: CK within normal limits: 72 on 12/31/2022.  No muscular weakness noted.  Other medical conditions are listed as follows:  History of MI (myocardial infarction)  History of breast cancer  Occasional tremors  History of gastroesophageal reflux (GERD)  Restless leg syndrome  History of hypertension: Blood pressure was 107/72 today in the office.  Orders: No orders of the defined types were placed in this encounter.  Meds ordered this encounter  Medications   methocarbamol  (ROBAXIN ) 500 MG tablet     Sig: Take 1 tablet (500 mg total) by mouth daily as needed for muscle spasms.    Dispense:  30 tablet    Refill:  0    Follow-Up Instructions: Return in about 6 months (around 07/20/2024) for Osteoarthritis, Fibromyalgia.   Waddell CHRISTELLA Craze, PA-C  Note - This record has been created using Dragon software.  Chart creation errors have been sought, but may not always  have been located. Such creation errors do not reflect on  the standard of medical care.

## 2024-01-18 ENCOUNTER — Ambulatory Visit: Attending: Physician Assistant | Admitting: Physician Assistant

## 2024-01-18 ENCOUNTER — Encounter: Payer: Self-pay | Admitting: Physician Assistant

## 2024-01-18 VITALS — BP 107/72 | HR 79 | Resp 16 | Ht 72.0 in | Wt 201.0 lb

## 2024-01-18 DIAGNOSIS — Z8719 Personal history of other diseases of the digestive system: Secondary | ICD-10-CM

## 2024-01-18 DIAGNOSIS — Z853 Personal history of malignant neoplasm of breast: Secondary | ICD-10-CM

## 2024-01-18 DIAGNOSIS — M797 Fibromyalgia: Secondary | ICD-10-CM

## 2024-01-18 DIAGNOSIS — M19041 Primary osteoarthritis, right hand: Secondary | ICD-10-CM

## 2024-01-18 DIAGNOSIS — R2689 Other abnormalities of gait and mobility: Secondary | ICD-10-CM

## 2024-01-18 DIAGNOSIS — M67911 Unspecified disorder of synovium and tendon, right shoulder: Secondary | ICD-10-CM

## 2024-01-18 DIAGNOSIS — M19042 Primary osteoarthritis, left hand: Secondary | ICD-10-CM

## 2024-01-18 DIAGNOSIS — R251 Tremor, unspecified: Secondary | ICD-10-CM

## 2024-01-18 DIAGNOSIS — G2581 Restless legs syndrome: Secondary | ICD-10-CM

## 2024-01-18 DIAGNOSIS — M25462 Effusion, left knee: Secondary | ICD-10-CM

## 2024-01-18 DIAGNOSIS — Z8679 Personal history of other diseases of the circulatory system: Secondary | ICD-10-CM

## 2024-01-18 DIAGNOSIS — I252 Old myocardial infarction: Secondary | ICD-10-CM

## 2024-01-18 DIAGNOSIS — M4156 Other secondary scoliosis, lumbar region: Secondary | ICD-10-CM

## 2024-01-18 DIAGNOSIS — R5383 Other fatigue: Secondary | ICD-10-CM

## 2024-01-18 DIAGNOSIS — F5101 Primary insomnia: Secondary | ICD-10-CM

## 2024-01-18 DIAGNOSIS — M1611 Unilateral primary osteoarthritis, right hip: Secondary | ICD-10-CM

## 2024-01-18 DIAGNOSIS — M7062 Trochanteric bursitis, left hip: Secondary | ICD-10-CM

## 2024-01-18 DIAGNOSIS — M7061 Trochanteric bursitis, right hip: Secondary | ICD-10-CM

## 2024-01-18 DIAGNOSIS — R748 Abnormal levels of other serum enzymes: Secondary | ICD-10-CM

## 2024-01-18 MED ORDER — METHOCARBAMOL 500 MG PO TABS: 500.0000 mg | ORAL_TABLET | Freq: Every day | ORAL | 0 refills | Status: AC | PRN

## 2024-02-22 ENCOUNTER — Telehealth: Payer: Self-pay | Admitting: Rheumatology

## 2024-02-22 NOTE — Telephone Encounter (Signed)
 Contacted the patient and advised Please clarify if the patient had further evaluation of the right hip pain she was experiencing? Last visit the right hip was addressed--recommend right hip intraarticular injection. If it is her knee she will need a full visit to update x-rays and discuss an injection. Patient states it is her right knee bothering her. Attempted to schedule the patient an appointment and patient could not do this Wednesday. Patient then states that she will call the office back.

## 2024-02-22 NOTE — Telephone Encounter (Signed)
 Please clarify if the patient had further evaluation of the right hip pain she was experiencing?  Last visit the right hip was addressed--recommend right hip intraarticular injection.  If it is her knee she will need a full visit to update x-rays and discuss an injection

## 2024-02-22 NOTE — Telephone Encounter (Signed)
 Contacted the patient and she is having right knee pain but no swelling. Patient states the pain has been going on for a while. Patient states she is wanting a knee injection since she did not get one at her last visit. Should patient be scheduled for a full office visit or injection visit. Please advise.

## 2024-02-22 NOTE — Telephone Encounter (Signed)
 Pt called stating she is having knee pain and would like someone to reach out to her.

## 2024-03-17 ENCOUNTER — Other Ambulatory Visit: Payer: Self-pay

## 2024-03-17 ENCOUNTER — Emergency Department (HOSPITAL_BASED_OUTPATIENT_CLINIC_OR_DEPARTMENT_OTHER)

## 2024-03-17 ENCOUNTER — Emergency Department (HOSPITAL_BASED_OUTPATIENT_CLINIC_OR_DEPARTMENT_OTHER)
Admission: EM | Admit: 2024-03-17 | Discharge: 2024-03-17 | Disposition: A | Discharge status: Home / Self Care | Attending: Emergency Medicine | Admitting: Emergency Medicine

## 2024-03-17 ENCOUNTER — Encounter (HOSPITAL_BASED_OUTPATIENT_CLINIC_OR_DEPARTMENT_OTHER): Payer: Self-pay | Admitting: Emergency Medicine

## 2024-03-17 ENCOUNTER — Other Ambulatory Visit (HOSPITAL_BASED_OUTPATIENT_CLINIC_OR_DEPARTMENT_OTHER): Payer: Self-pay

## 2024-03-17 DIAGNOSIS — Z79899 Other long term (current) drug therapy: Secondary | ICD-10-CM | POA: Diagnosis not present

## 2024-03-17 DIAGNOSIS — Z7984 Long term (current) use of oral hypoglycemic drugs: Secondary | ICD-10-CM | POA: Insufficient documentation

## 2024-03-17 DIAGNOSIS — E1122 Type 2 diabetes mellitus with diabetic chronic kidney disease: Secondary | ICD-10-CM | POA: Diagnosis not present

## 2024-03-17 DIAGNOSIS — Z853 Personal history of malignant neoplasm of breast: Secondary | ICD-10-CM | POA: Diagnosis not present

## 2024-03-17 DIAGNOSIS — I129 Hypertensive chronic kidney disease with stage 1 through stage 4 chronic kidney disease, or unspecified chronic kidney disease: Secondary | ICD-10-CM | POA: Diagnosis not present

## 2024-03-17 DIAGNOSIS — N390 Urinary tract infection, site not specified: Secondary | ICD-10-CM | POA: Diagnosis not present

## 2024-03-17 DIAGNOSIS — I2510 Atherosclerotic heart disease of native coronary artery without angina pectoris: Secondary | ICD-10-CM | POA: Insufficient documentation

## 2024-03-17 DIAGNOSIS — R0602 Shortness of breath: Secondary | ICD-10-CM | POA: Diagnosis not present

## 2024-03-17 DIAGNOSIS — N189 Chronic kidney disease, unspecified: Secondary | ICD-10-CM | POA: Diagnosis not present

## 2024-03-17 DIAGNOSIS — R079 Chest pain, unspecified: Secondary | ICD-10-CM | POA: Diagnosis not present

## 2024-03-17 DIAGNOSIS — R112 Nausea with vomiting, unspecified: Secondary | ICD-10-CM

## 2024-03-17 DIAGNOSIS — Z7982 Long term (current) use of aspirin: Secondary | ICD-10-CM | POA: Insufficient documentation

## 2024-03-17 DIAGNOSIS — R109 Unspecified abdominal pain: Secondary | ICD-10-CM | POA: Diagnosis present

## 2024-03-17 LAB — URINALYSIS, W/ REFLEX TO CULTURE (INFECTION SUSPECTED)
Bilirubin Urine: NEGATIVE
Glucose, UA: NEGATIVE mg/dL
Ketones, ur: NEGATIVE mg/dL
Leukocytes,Ua: NEGATIVE
Nitrite: POSITIVE — AB
Protein, ur: NEGATIVE mg/dL
Specific Gravity, Urine: 1.01 (ref 1.005–1.030)
pH: 7 (ref 5.0–8.0)

## 2024-03-17 LAB — HEPATIC FUNCTION PANEL
ALT: 11 U/L (ref 0–44)
AST: 16 U/L (ref 15–41)
Albumin: 4.6 g/dL (ref 3.5–5.0)
Alkaline Phosphatase: 149 U/L — ABNORMAL HIGH (ref 38–126)
Bilirubin, Direct: 0.3 mg/dL — ABNORMAL HIGH (ref 0.0–0.2)
Indirect Bilirubin: 0.3 mg/dL (ref 0.3–0.9)
Total Bilirubin: 0.6 mg/dL (ref 0.0–1.2)
Total Protein: 7.7 g/dL (ref 6.5–8.1)

## 2024-03-17 LAB — LIPASE, BLOOD: Lipase: 25 U/L (ref 11–51)

## 2024-03-17 LAB — BASIC METABOLIC PANEL WITH GFR
Anion gap: 13 (ref 5–15)
BUN: 12 mg/dL (ref 8–23)
CO2: 24 mmol/L (ref 22–32)
Calcium: 9.4 mg/dL (ref 8.9–10.3)
Chloride: 103 mmol/L (ref 98–111)
Creatinine, Ser: 1.11 mg/dL — ABNORMAL HIGH (ref 0.44–1.00)
GFR, Estimated: 52 mL/min — ABNORMAL LOW (ref >60)
Glucose, Bld: 118 mg/dL — ABNORMAL HIGH (ref 70–99)
Potassium: 3.8 mmol/L (ref 3.5–5.1)
Sodium: 140 mmol/L (ref 135–145)

## 2024-03-17 LAB — CBC
HCT: 39.7 % (ref 36.0–46.0)
Hemoglobin: 13.4 g/dL (ref 12.0–15.0)
MCH: 30.5 pg (ref 26.0–34.0)
MCHC: 33.8 g/dL (ref 30.0–36.0)
MCV: 90.4 fL (ref 80.0–100.0)
Platelets: 277 K/uL (ref 150–400)
RBC: 4.39 MIL/uL (ref 3.87–5.11)
RDW: 12.7 % (ref 11.5–15.5)
WBC: 10 K/uL (ref 4.0–10.5)
nRBC: 0 % (ref 0.0–0.2)

## 2024-03-17 LAB — PRO BRAIN NATRIURETIC PEPTIDE: Pro Brain Natriuretic Peptide: 90 pg/mL (ref <300.0)

## 2024-03-17 LAB — TROPONIN T, HIGH SENSITIVITY
Troponin T High Sensitivity: <15 ng/L (ref 0–19)
Troponin T High Sensitivity: <15 ng/L (ref 0–19)

## 2024-03-17 MED ORDER — CEPHALEXIN 500 MG PO CAPS
500.0000 mg | ORAL_CAPSULE | Freq: Three times a day (TID) | ORAL | 0 refills | Status: AC
Start: 2024-03-17 — End: 2024-03-27
  Filled 2024-03-17: qty 28, 10d supply, fill #0

## 2024-03-17 MED ORDER — LACTATED RINGERS IV BOLUS
1000.0000 mL | Freq: Once | INTRAVENOUS | Status: AC
  Administered 2024-03-17: 1000 mL via INTRAVENOUS

## 2024-03-17 MED ORDER — FENTANYL CITRATE PF 50 MCG/ML IJ SOSY
50.0000 ug | PREFILLED_SYRINGE | Freq: Once | INTRAMUSCULAR | Status: AC
  Administered 2024-03-17: 50 ug via INTRAVENOUS
  Filled 2024-03-17: qty 1

## 2024-03-17 MED ORDER — IOHEXOL 350 MG/ML SOLN
100.0000 mL | Freq: Once | INTRAVENOUS | Status: AC | PRN
Start: 2024-03-17 — End: 2024-03-17
  Administered 2024-03-17: 100 mL via INTRAVENOUS

## 2024-03-17 MED ORDER — ONDANSETRON HCL 4 MG/2ML IJ SOLN
4.0000 mg | Freq: Once | INTRAMUSCULAR | Status: AC
  Administered 2024-03-17: 4 mg via INTRAVENOUS
  Filled 2024-03-17: qty 2

## 2024-03-17 MED ORDER — PANTOPRAZOLE SODIUM 40 MG IV SOLR
40.0000 mg | Freq: Once | INTRAVENOUS | Status: AC
  Administered 2024-03-17: 40 mg via INTRAVENOUS
  Filled 2024-03-17: qty 10

## 2024-03-17 MED ORDER — ONDANSETRON 4 MG PO TBDP
4.0000 mg | ORAL_TABLET | Freq: Three times a day (TID) | ORAL | 0 refills | Status: AC | PRN
  Filled 2024-03-17: qty 20, 7d supply, fill #0

## 2024-03-17 NOTE — ED Provider Notes (Signed)
 [place] [facility] Provider Note   CSN: [id number] Arrival date & time: 03/17/24  9158     Patient presents with: Chest Pain   Whitney Ward is a 73 y.o. female.   HPI       73 year old female with a history of breast carcinoma diagnosed in 2012 status post chemotherapy, radiation, lumpectomy, type II DM, fibromyalgia, GERD, hypertension, hyperlipidemia, CKD who presents with concern for chest pain, nausea, vomiting and abdominal pain.   Lower chest, top of abdomen with abdominal pain with radiation down to lower abdomen as well.  Started yesterday.  Nausea and vomiting, vomited about 20 times. No blood.  Having BM, formed stool, not passing gas. No black or bloody stools.  No fever Shortness of breath  Also having urinary symptoms, dysuria and foul odor Feeling lightheaded Sharp pain lower abdomen, more of burning in upper area to chest  Hysterectomy, CS, hemorrhoid surgery, not small intestine surgery 3 weeks ago was hemorrhoid surgery with [facility] gastroenterology  Past Medical History:  Diagnosis Date   Arthritis    Breast cancer (HCC)    Breast cancer (HCC)    Broken toe    CAD (coronary artery disease)    Cataract    Diabetes mellitus without complication (HCC)    Fibromyalgia    Hypertension    MI (myocardial infarction) [facility])     Past Surgical History:  Procedure Laterality Date   ABDOMINAL HYSTERECTOMY     BREAST SURGERY     CESAREAN SECTION     ingrown toenail  03/21/2021   SMALL INTESTINE SURGERY     TONSILLECTOMY      Prior to Admission medications   Medication Sig Start Date End Date Taking? Authorizing Provider  cephALEXin  (KEFLEX ) 500 MG capsule Take 1 capsule (500 mg total) by mouth 3 (three) times daily for 10 days. 03/17/24 03/27/24 Yes Dreama Longs, MD  ondansetron  (ZOFRAN -ODT) 4 MG disintegrating tablet Take 1 tablet (4 mg total) by mouth every 8 (eight) hours as needed for nausea or vomiting.  03/17/24  Yes Dreama Longs, MD  Accu-Chek Softclix Lancets lancets  07/15/22   [provider]  Alcohol Swabs (DROPSAFE ALCOHOL PREP) 70 % PADS Apply topically. 07/15/22   [provider]  amLODipine (NORVASC) 10 MG tablet Take 10 mg by mouth daily.    [provider]  aspirin 81 MG tablet Take 81 mg by mouth daily.    [provider]  atenolol (TENORMIN) 50 MG tablet Take 50 mg by mouth daily. 07/03/22   [provider]  atorvastatin (LIPITOR) 20 MG tablet SMARTSIG:1 Tablet(s) By Mouth Every Evening 02/13/22   [provider]  Biotin 1 MG CAPS Take by mouth.    [provider]  Blood Glucose Monitoring Suppl (ACCU-CHEK GUIDE ME) w/Device KIT  07/15/22   [provider]  celecoxib  (CELEBREX ) 200 MG capsule Take 200 mg by mouth 2 (two) times daily as needed. 12/29/23   [provider]  clonazePAM  (KLONOPIN ) 1 MG tablet TAKE 1 TABLET BY MOUTH AT BEDTIME AS NEEDED 12/28/17   Cheryl Waddell HERO, PA-C  co-enzyme Q-10 30 MG capsule Take 30 mg by mouth daily.    [provider]  gabapentin (NEURONTIN) 600 MG tablet TAKE 2 TABLETS BY MOUTH DIVIDED DURING THE DAY AND TAKE 2 TABLETS AT BEDTIME 02/08/19   [provider]  MAGNESIUM CHLORIDE ER PO Take by mouth.    [provider]  metFORMIN  (GLUCOPHAGE ) 500  MG tablet Take 1 tablet (500 mg total) by mouth 2 (two) times daily with a meal. 01/01/23   Jerral Meth, MD  methocarbamol  (ROBAXIN ) 500 MG tablet Take 1 tablet (500 mg total) by mouth daily as needed for muscle spasms. 01/18/24   Cheryl Waddell HERO, PA-C  olmesartan (BENICAR) 40 MG tablet Take 40 mg by mouth daily. 02/03/22   [provider]  omeprazole (PRILOSEC) 20 MG capsule Take 20 mg by mouth daily.    [provider]  PRECISION QID TEST test strip  04/01/16   [provider]  Pyridoxine HCl (VITAMIN B6 PO) Take by mouth.    [provider]  Semaglutide, 1 MG/DOSE,  (OZEMPIC, 1 MG/DOSE,) 4 MG/3ML SOPN Inject 1 mg into the skin once a week. 03/22/23   [provider]  traZODone (DESYREL) 150 MG tablet Take by mouth.    [provider]  valACYclovir (VALTREX) 500 MG tablet Take 500 mg by mouth 2 (two) times daily.    [provider]  venlafaxine XR (EFFEXOR-XR) 150 MG 24 hr capsule TK ONE C PO QAM FOR DEPRESSION 02/16/17   [provider]  vitamin B-12 (CYANOCOBALAMIN) 1000 MCG tablet Take by mouth.    [provider]    Allergies: Codeine, Penicillins, Sulfa antibiotics, Tramadol, Aromasin [exemestane], Atorvastatin, Celexa [citalopram hydrobromide], Citalopram, and Tamoxifen    Review of Systems  Updated Vital Signs BP 131/83   Pulse 87   Temp 98.2 F (36.8 C)   Resp 19   Wt 88.5 kg   SpO2 97%   BMI 26.45 kg/m   Physical Exam Vitals and nursing note reviewed.  Constitutional:      General: She is not in acute distress.    Appearance: She is well-developed. She is not diaphoretic.  HENT:     Head: Normocephalic and atraumatic.  Eyes:     Conjunctiva/sclera: Conjunctivae normal.  Cardiovascular:     Rate and Rhythm: Normal rate and regular rhythm.     Heart sounds: Normal heart sounds. No murmur heard.    No friction rub. No gallop.  Pulmonary:     Effort: Pulmonary effort is normal. No respiratory distress.     Breath sounds: Normal breath sounds. No wheezing or rales.  Abdominal:     General: There is abdominal bruit. There is no distension.     Palpations: Abdomen is soft.     Tenderness: There is no guarding.  Musculoskeletal:        General: No tenderness.     Cervical back: Normal range of motion.  Skin:    General: Skin is warm and dry.     Findings: No erythema or rash.  Neurological:     Mental Status: She is alert and oriented to person, place, and time.     (all labs ordered are listed, but only abnormal results are displayed) Labs Reviewed  BASIC METABOLIC PANEL WITH GFR  - Abnormal; Notable for the following components:      Result Value   Glucose, Bld 118 (*)    Creatinine, Ser 1.11 (*)    GFR, Estimated 52 (*)    All other components within normal limits  HEPATIC FUNCTION PANEL - Abnormal; Notable for the following components:   Alkaline Phosphatase 149 (*)    Bilirubin, Direct 0.3 (*)    All other components within normal limits  URINALYSIS, W/ REFLEX TO CULTURE (INFECTION SUSPECTED) - Abnormal; Notable for the following components:   Hgb urine dipstick TRACE (*)  Nitrite POSITIVE (*)    Bacteria, UA MANY (*)    All other components within normal limits  URINE CULTURE  CBC  LIPASE, BLOOD  PRO BRAIN NATRIURETIC PEPTIDE  TROPONIN T, HIGH SENSITIVITY  TROPONIN T, HIGH SENSITIVITY    EKG: EKG Interpretation Date/Time:  Thursday March 17 2024 09:45:19 EDT Ventricular Rate:  90 PR Interval:  171 QRS Duration:  77 QT Interval:  355 QTC Calculation: 435 R Axis:   17  Text Interpretation: Sinus rhythm Abnormal R-wave progression, early transition No significant change since last tracing Confirmed by Dreama Longs ([id number]) on 03/17/2024 10:35:33 AM  Radiology: CT Angio Chest PE W and/or Wo Contrast Result Date: 03/17/2024 CLINICAL DATA:  Pulmonary embolism (PE) suspected, high prob; Bowel obstruction suspected. EXAM: CT ANGIOGRAPHY CHEST CT ABDOMEN AND PELVIS WITH CONTRAST TECHNIQUE: Multidetector CT imaging of the chest was performed using the standard protocol during bolus administration of intravenous contrast. Multiplanar CT image reconstructions and MIPs were obtained to evaluate the vascular anatomy. Multidetector CT imaging of the abdomen and pelvis was performed using the standard protocol during bolus administration of intravenous contrast. RADIATION DOSE REDUCTION: This exam was performed according to the departmental dose-optimization program which includes automated exposure control, adjustment of the mA and/or kV according to patient  size and/or use of iterative reconstruction technique. CONTRAST:  OMNIPAQUE  IOHEXOL  350 MG/ML SOLN COMPARISON:  CT scan abdomen and pelvis from 10/24/2023 and CT angiography chest from 09/04/2021. FINDINGS: CTA CHEST FINDINGS Cardiovascular: No evidence of embolism to the proximal subsegmental pulmonary artery level. Normal cardiac size. No pericardial effusion. No aortic aneurysm. There are mild peripheral atherosclerotic vascular calcifications of thoracic aorta and its major branches. Mediastinum/Nodes: Visualized thyroid gland appears grossly unremarkable. No solid / cystic mediastinal masses. The esophagus is nondistended precluding optimal assessment. No axillary, mediastinal or hilar lymphadenopathy by size criteria. Lungs/Pleura: The central tracheo-bronchial tree is patent. There are dependent changes in bilateral lungs. No mass or consolidation. No pleural effusion or pneumothorax. No suspicious lung nodules. There is a stable 3 mm calcified granuloma in the left lung lower lobe. Musculoskeletal: There is focal scarring in the subcutaneous tissue over the left paramedian middle chest wall, similar to the prior study and likely from prior surgery/intervention. The visualized soft tissues of the chest wall are otherwise grossly unremarkable. No suspicious osseous lesions. There are mild multilevel degenerative changes in the visualized spine. Review of the MIP images confirms the above findings. CT ABDOMEN and PELVIS FINDINGS Hepatobiliary: The liver is normal in size. Non-cirrhotic configuration. No suspicious mass. No intrahepatic or extrahepatic bile duct dilation. There is focal mildly hyperattenuating thickening of the tip of the gallbladder, likely present on the prior exam, favored to represent focal adenomyomatosis. No calcified gallstones. No pericholecystic inflammatory changes. Pancreas: Unremarkable. No pancreatic ductal dilatation or surrounding inflammatory changes. Spleen: Within normal  limits. No focal lesion. Adrenals/Urinary Tract: Adrenal glands are unremarkable. No suspicious renal mass. There is a 3 mm nonobstructing calculus in the left kidney interpolar region. No other nephroureterolithiasis or obstructive uropathy on either side. There are multiple sinus cysts in bilateral kidneys, right more than left. Urinary bladder is under distended, precluding optimal assessment. However, no large mass or stones identified. No perivesical fat stranding. Stomach/Bowel: No disproportionate dilation of the small or large bowel loops. No evidence of abnormal bowel wall thickening or inflammatory changes. The appendix is unremarkable. There are scattered diverticula throughout the colon, without imaging signs of diverticulitis. Vascular/Lymphatic: There is trace amount of ascites in  the dependent pelvis. No walled-off abscess or loculated collection. No pneumoperitoneum. No abdominal or pelvic lymphadenopathy, by size criteria. No aneurysmal dilation of the major abdominal arteries. Reproductive: The uterus is surgically absent. No large adnexal mass. Other: There is a tiny fat containing umbilical hernia. The soft tissues and abdominal wall are otherwise unremarkable. Musculoskeletal: No suspicious osseous lesions. There are moderate multilevel degenerative changes in the visualized spine. Review of the MIP images confirms the above findings. IMPRESSION: 1. No embolism to the proximal subsegmental pulmonary artery level. 2. No acute inflammatory process identified within the chest, abdomen or pelvis. No bowel obstruction. 3. Multiple other nonacute observations, as described above. Electronically Signed   By: Ree Molt M.D.   On: 03/17/2024 12:30   CT ABDOMEN PELVIS W CONTRAST Result Date: 03/17/2024 CLINICAL DATA:  Pulmonary embolism (PE) suspected, high prob; Bowel obstruction suspected. EXAM: CT ANGIOGRAPHY CHEST CT ABDOMEN AND PELVIS WITH CONTRAST TECHNIQUE: Multidetector CT imaging of the  chest was performed using the standard protocol during bolus administration of intravenous contrast. Multiplanar CT image reconstructions and MIPs were obtained to evaluate the vascular anatomy. Multidetector CT imaging of the abdomen and pelvis was performed using the standard protocol during bolus administration of intravenous contrast. RADIATION DOSE REDUCTION: This exam was performed according to the departmental dose-optimization program which includes automated exposure control, adjustment of the mA and/or kV according to patient size and/or use of iterative reconstruction technique. CONTRAST:  OMNIPAQUE  IOHEXOL  350 MG/ML SOLN COMPARISON:  CT scan abdomen and pelvis from 10/24/2023 and CT angiography chest from 09/04/2021. FINDINGS: CTA CHEST FINDINGS Cardiovascular: No evidence of embolism to the proximal subsegmental pulmonary artery level. Normal cardiac size. No pericardial effusion. No aortic aneurysm. There are mild peripheral atherosclerotic vascular calcifications of thoracic aorta and its major branches. Mediastinum/Nodes: Visualized thyroid gland appears grossly unremarkable. No solid / cystic mediastinal masses. The esophagus is nondistended precluding optimal assessment. No axillary, mediastinal or hilar lymphadenopathy by size criteria. Lungs/Pleura: The central tracheo-bronchial tree is patent. There are dependent changes in bilateral lungs. No mass or consolidation. No pleural effusion or pneumothorax. No suspicious lung nodules. There is a stable 3 mm calcified granuloma in the left lung lower lobe. Musculoskeletal: There is focal scarring in the subcutaneous tissue over the left paramedian middle chest wall, similar to the prior study and likely from prior surgery/intervention. The visualized soft tissues of the chest wall are otherwise grossly unremarkable. No suspicious osseous lesions. There are mild multilevel degenerative changes in the visualized spine. Review of the MIP images  confirms the above findings. CT ABDOMEN and PELVIS FINDINGS Hepatobiliary: The liver is normal in size. Non-cirrhotic configuration. No suspicious mass. No intrahepatic or extrahepatic bile duct dilation. There is focal mildly hyperattenuating thickening of the tip of the gallbladder, likely present on the prior exam, favored to represent focal adenomyomatosis. No calcified gallstones. No pericholecystic inflammatory changes. Pancreas: Unremarkable. No pancreatic ductal dilatation or surrounding inflammatory changes. Spleen: Within normal limits. No focal lesion. Adrenals/Urinary Tract: Adrenal glands are unremarkable. No suspicious renal mass. There is a 3 mm nonobstructing calculus in the left kidney interpolar region. No other nephroureterolithiasis or obstructive uropathy on either side. There are multiple sinus cysts in bilateral kidneys, right more than left. Urinary bladder is under distended, precluding optimal assessment. However, no large mass or stones identified. No perivesical fat stranding. Stomach/Bowel: No disproportionate dilation of the small or large bowel loops. No evidence of abnormal bowel wall thickening or inflammatory changes. The appendix is unremarkable. There  are scattered diverticula throughout the colon, without imaging signs of diverticulitis. Vascular/Lymphatic: There is trace amount of ascites in the dependent pelvis. No walled-off abscess or loculated collection. No pneumoperitoneum. No abdominal or pelvic lymphadenopathy, by size criteria. No aneurysmal dilation of the major abdominal arteries. Reproductive: The uterus is surgically absent. No large adnexal mass. Other: There is a tiny fat containing umbilical hernia. The soft tissues and abdominal wall are otherwise unremarkable. Musculoskeletal: No suspicious osseous lesions. There are moderate multilevel degenerative changes in the visualized spine. Review of the MIP images confirms the above findings. IMPRESSION: 1. No embolism  to the proximal subsegmental pulmonary artery level. 2. No acute inflammatory process identified within the chest, abdomen or pelvis. No bowel obstruction. 3. Multiple other nonacute observations, as described above. Electronically Signed   By: Ree Molt M.D.   On: 03/17/2024 12:30   DG Chest 2 View Result Date: 03/17/2024 CLINICAL DATA:  Chest pain EXAM: CHEST - 2 VIEW COMPARISON:  None Available. FINDINGS: Normal mediastinum and cardiac silhouette. Normal pulmonary vasculature. No evidence of effusion, infiltrate, or pneumothorax. No acute bony abnormality. Degenerative osteophytosis of the spine. IMPRESSION: No acute cardiopulmonary process. Electronically Signed   By: Jackquline Boxer M.D.   On: 03/17/2024 10:05     Procedures   Medications Ordered in the ED  ondansetron  (ZOFRAN ) injection 4 mg (4 mg Intravenous Given 03/17/24 1120)  fentaNYL  (SUBLIMAZE ) injection 50 mcg (50 mcg Intravenous Given 03/17/24 1122)  lactated ringers  bolus 1,000 mL (0 mLs Intravenous Stopped 03/17/24 1429)  pantoprazole  (PROTONIX ) injection 40 mg (40 mg Intravenous Given 03/17/24 1126)  iohexol  (OMNIPAQUE ) 350 MG/ML injection 100 mL (100 mLs Intravenous Contrast Given 03/17/24 1135)                                     73 year old female with a history of breast carcinoma diagnosed in 2012 status post chemotherapy, radiation, lumpectomy, type II DM, fibromyalgia, GERD, hypertension, hyperlipidemia, CKD who presents with concern for chest pain, dyspnea, nausea, vomiting and abdominal pain.  DDx includes appendicitis, pancreatitis, cholecystitis, pyelonephritis, nephrolithiasis, diverticulitis, SBO, ACS, dissection, PE, pneumonia, pneumothorax.  Labs completed and personally about interpreted by me show no transaminitis, no signs of pancreatitis, no signs of congestive heart failure, no clinically significant electrolyte abnormalities, no clinically significant anemia or leukocytosis.  Her troponin is within  normal limits x2 and doubt ACS.  EKG was completed and personally about interpreted by me shows a normal sinus rhythm without acute ST changes.  Chest x-ray without pneumonia, pneumothorax, or pulmonary edema.  CT PE study done given dyspnea, chest pain, and CT abdomen pelvis completed to evaluate for SBO.   CT without acute abnormalities.   UA consistent with UTI. Will treat for UTI as etiology of nausea, vomiting and dysuria. She is tolerating po.  CP likely GERD/gastritis in setting of emesis.  Given rx for keflex , discussed reasons to return. Patient discharged in stable condition with understanding of reasons to return.     Final diagnoses:  Urinary tract infection without hematuria, site unspecified  Nausea and vomiting, unspecified vomiting type  Chest pain, unspecified type    ED Discharge Orders          Ordered    cephALEXin  (KEFLEX ) 500 MG capsule  3 times daily        03/17/24 1503    ondansetron  (ZOFRAN -ODT) 4 MG disintegrating tablet  Every 8 hours PRN  03/17/24 1503               Dreama Longs, MD 03/17/24 2322

## 2024-03-17 NOTE — ED Triage Notes (Signed)
 Reports central burning pain going down to her abdomen , started yesterday , vomited too .  Reports also shortness of breath . Hx GERD, no relief despite her meds .

## 2024-03-17 NOTE — ED Notes (Signed)
 Attempted to pull second Trop from IV site but no blood will pull back .... Flushed and no problems with IV site flushing with NS.  Pt. Aware that Straight stick for second Trop will have to be done.

## 2024-03-17 NOTE — ED Notes (Signed)
 Dc instructions given, pt verbalized understanding. Out of ED with steady gait with all belongings not in visible distress.

## 2024-03-17 NOTE — ED Notes (Signed)
Patient is not able to give a urine specimen at this time

## 2024-03-19 LAB — URINE CULTURE: Culture: >=100000 — AB

## 2024-03-20 ENCOUNTER — Telehealth (HOSPITAL_BASED_OUTPATIENT_CLINIC_OR_DEPARTMENT_OTHER): Payer: Self-pay | Admitting: *Deleted

## 2024-03-20 NOTE — Telephone Encounter (Signed)
 Post ED Visit - Positive Culture Follow-up  Culture report reviewed by antimicrobial stewardship pharmacist: [facility] [address] Pharm.D. []  Venetia Gully, Pharm.D., BCPS AQ-ID []  Garrel Crews, Pharm.D., BCPS []  Almarie Lunger, Pharm.D., BCPS []  [place], [place].D., BCPS, AAHIVP []  Rosaline Bihari, Pharm.D., BCPS, AAHIVP []  Vernell Meier, PharmD, BCPS []  Latanya Hint, PharmD, BCPS []  Donald Medley, PharmD, BCPS []  Rocky Bold, PharmD []  Dorothyann Alert, PharmD, BCPS [x]  Dorn Poot, PharmD  [facility] []  Rosaline Edison, PharmD []  Romona Bliss, PharmD []  Dolphus Roller, PharmD []  Veva Seip, Rph []  Vernell Daunt) Leonce, PharmD []  Eva Allis, PharmD []  Rosaline Millet, PharmD []  Iantha Batch, PharmD []  Arvin Gauss, PharmD []  Wanda Hasting, PharmD []  Ronal Rav, PharmD []  Rocky Slade, PharmD []  Bard Jeans, PharmD   Positive urine culture Treated with Cephalexin , organism sensitive to the same and no further patient follow-up is required at this time.  Whitney Ward 03/20/2024, 10:49 AM
